# Patient Record
Sex: Female | Born: 1995 | Race: Black or African American | Hispanic: No | Marital: Single | Smoking: Former smoker
Health system: Southern US, Community
[De-identification: ages and names within clinical notes are randomized; demographics above are authoritative.]

## PROBLEM LIST (undated history)

## (undated) ENCOUNTER — Inpatient Hospital Stay: Payer: Self-pay

## (undated) DIAGNOSIS — F329 Major depressive disorder, single episode, unspecified: Secondary | ICD-10-CM

## (undated) DIAGNOSIS — M329 Systemic lupus erythematosus, unspecified: Secondary | ICD-10-CM

## (undated) DIAGNOSIS — M1990 Unspecified osteoarthritis, unspecified site: Secondary | ICD-10-CM

## (undated) DIAGNOSIS — IMO0002 Reserved for concepts with insufficient information to code with codable children: Secondary | ICD-10-CM

## (undated) DIAGNOSIS — F53 Postpartum depression: Secondary | ICD-10-CM

## (undated) DIAGNOSIS — I10 Essential (primary) hypertension: Secondary | ICD-10-CM

## (undated) DIAGNOSIS — J45909 Unspecified asthma, uncomplicated: Secondary | ICD-10-CM

## (undated) DIAGNOSIS — F32A Depression, unspecified: Secondary | ICD-10-CM

## (undated) DIAGNOSIS — O99345 Other mental disorders complicating the puerperium: Secondary | ICD-10-CM

## (undated) DIAGNOSIS — O99019 Anemia complicating pregnancy, unspecified trimester: Secondary | ICD-10-CM

## (undated) HISTORY — PX: DILATION AND EVACUATION: SHX1459

---
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
## 2015-03-10 ENCOUNTER — Emergency Department: Payer: Self-pay | Admitting: Physician Assistant

## 2015-05-01 NOTE — H&P (Signed)
L&D Evaluation:  History:  HPI 19 yo G1at 941w3d by EDC of 07/24/2014 by LMP=10/17/2013 and 10 week ultrasound presenting for contractions.   She noted contractions after her exam today when membranes were striped. Contractions became more frequent around 2300 tonight. No LOF or VB. +FM  No prenatal labs available for review other than GBS negative, GC & CT negative on 07/12/14.   [facility], noteable for low PAPP-A levels (2%ile) but otherwise normal 1st trimester screen.  EFW on 05/09/14 c./w 45% and EFW on 7/29 was 6#15 oz.with a normal AFI.   Presents with contractions   Patient's Medical History Asthma  Anemia   Patient's Surgical History Wisdom teeth extraction   Medications Pre Natal Vitamins  Iron  QVAR-2 puffs BID, Albuterol prn   Allergies NKDA, Shellfish   Social History none   Family History Non-Contributory   ROS:  ROS see HPI   Exam:  General breathing with contractions   Mental Status clear   Chest clear   Heart normal sinus rhythm, no murmur/gallop/rubs   Abdomen gravid, tender with contractions   Estimated Fetal Weight 6#15 oz on 7/29/ultrasound   Fetal Position cephalic   Edema trace   Reflexes 2+   Pelvic 4.5/C/0 (was 3/90%/-1 on arrival)   Mebranes Intact   FHT normal rate with no decels, 125 baseline with mod variability.   Fetal Heart Rate 125   Ucx q2-3   Impression:  Impression IUP at 40 1/7 weeks in labor   Plan:  Plan Admit. Expectant management. Stadol for pain. Epidural when appropriate.  Prenatal labs ordered.   Electronic Signatures: Trinna BalloonGutierrez, Robertt Buda L (CNM)  (Signed 04-Aug-15 02:59)  Authored: L&D Evaluation   Last Updated: 04-Aug-15 02:59 by Trinna BalloonGutierrez, Verbena Boeding L (CNM)

## 2015-05-01 NOTE — H&P (Signed)
L&D Evaluation:  History:  HPI 19 [facility]d [facility] for [facility].  Last cervical check was yesterday and was 1.5/50/-2.  She noted contractions starting this morning increasing in intensity. She also reports a headache and back pain.  +FM, no LOF, no VB.  No prenatal labs available for review other than GBS negative, GC & CT negative on 07/12/14.   [facility], noteable for low PAPP-A levels (2%ile) but otherwise normal 1st trimester screen.  EFW on 05/09/14 c./w 45%ile although I do not have actual measurement available for review and the patient had a subsequent lapse in care.   Presents with back pain, contractions, other, headache   Patient's Medical History Asthma   Patient's Surgical History none   Medications Pre Natal Vitamins   Allergies NKDA   Social History none   Family History Non-Contributory   ROS:  ROS All systems were reviewed.  HEENT, CNS, GI, GU, Respiratory, CV, Renal and Musculoskeletal systems were found to be normal.   Exam:  Vital Signs stable  138/82   Urine Protein trace, trace to negative   General no apparent distress   Mental Status clear   Chest clear   Heart normal sinus rhythm   Abdomen gravid, non-tender   Estimated Fetal Weight Average for gestational age   Fetal Position vtx   Edema no edema   Pelvic no external lesions, 1.5/50/-2   Mebranes Intact   FHT normal rate with no decels, 120, moderate, positive accels, no decels   Ucx irregular, 4-738min   Skin dry, no lesions, no rashes   Lymph no lymphadenopathy   Impression:  Impression Early labor vs braxton hicks contractions   Plan:  Plan EFM/NST, monitor contractions and for cervical change, will continue to monitor for cervical change. Cycle BP, send urine protein/cratinine ratio.   Comments Will get prenatal records to review.   Follow Up Appointment need to schedule   Electronic Signatures: Jannet MantisSubudhi, Paxton Binns (CNM)   (Signed 30-Jul-15 13:20)  Authored: L&D Evaluation   Last Updated: 30-Jul-15 13:20 by Jannet MantisSubudhi, Cacie Gaskins (CNM)

## 2015-05-01 NOTE — H&P (Signed)
L&D Evaluation:  History:  HPI 19 yo G1at 4420w1d by [facility] presenting for contractions.  Last cervical check in clinic .  She noted contractions starting this morning increasing in intensity.  +FM, no LOF, no VB.  No prenatal labs available for review other than GBS negative, GC & CT negative on 07/12/14.    [facility], noteable for low PAPP-A levels (2%ile) but otherwise normal 1st trimester screen.  EFW on 05/09/14 c./w 45%ile although I do not have actual measurement available for review and the patient had a subsequent laps in care.   Presents with contractions   Patient's Medical History Asthma   Patient's Surgical History none   Medications Pre Natal Vitamins   Allergies NKDA   Social History none   Family History Non-Contributory   ROS:  ROS All systems were reviewed.  HEENT, CNS, GI, GU, Respiratory, CV, Renal and Musculoskeletal systems were found to be normal.   Exam:  Vital Signs stable   Urine Protein not completed   General no apparent distress   Mental Status clear   Chest clear   Heart normal sinus rhythm   Abdomen gravid, non-tender   Estimated Fetal Weight Average for gestational age   Fetal Position vtx   Edema no edema   Pelvic no external lesions, 1.5/50/-3 unchanged over 2-hrs   Mebranes Intact   FHT normal rate with no decels, 120, moderate, positive accels, no decels   Ucx irregular, 5-809min   Impression:  Impression Early labor vs braxton hicks contractions   Plan:  Plan EFM/NST, monitor contractions and for cervical change   Comments 1) Labor - likely braxton hick contractions as no cervical change.  Given option of discharge home vs morphine sleep.  Patient opted for morphine sleep  2) Fetus - reactive category I tracing  3) PNL - unavailable for review  4) If fails to make cervical change and ressuring montioring home later this evening vs tomorrow morning with follow up tomorrow in clinic 07/19/14 18:30  [place]   Electronic Signatures: Lorrene ReidStaebler, Dario Yono M (MD)  (Signed 28-Jul-15 18:28)  Authored: L&D Evaluation   Last Updated: 28-Jul-15 18:28 by Lorrene ReidStaebler, Mikko Lewellen M (MD)

## 2015-07-26 ENCOUNTER — Emergency Department
Admission: EM | Admit: 2015-07-26 | Discharge: 2015-07-26 | Disposition: A | Discharge status: Home / Self Care | Payer: Medicaid Other | Attending: Emergency Medicine | Admitting: Emergency Medicine

## 2015-07-26 ENCOUNTER — Other Ambulatory Visit: Payer: Self-pay

## 2015-07-26 ENCOUNTER — Encounter: Payer: Self-pay | Admitting: Emergency Medicine

## 2015-07-26 DIAGNOSIS — E860 Dehydration: Secondary | ICD-10-CM | POA: Diagnosis not present

## 2015-07-26 DIAGNOSIS — Z720 Tobacco use: Secondary | ICD-10-CM | POA: Insufficient documentation

## 2015-07-26 DIAGNOSIS — R55 Syncope and collapse: Secondary | ICD-10-CM | POA: Diagnosis present

## 2015-07-26 DIAGNOSIS — Z3202 Encounter for pregnancy test, result negative: Secondary | ICD-10-CM | POA: Diagnosis not present

## 2015-07-26 HISTORY — DX: Anemia complicating pregnancy, unspecified trimester: O99.019

## 2015-07-26 LAB — URINALYSIS COMPLETE WITH MICROSCOPIC (ARMC ONLY)
BILIRUBIN URINE: NEGATIVE
Bacteria, UA: NONE SEEN
Glucose, UA: NEGATIVE mg/dL
Hgb urine dipstick: NEGATIVE
Nitrite: NEGATIVE
Protein, ur: NEGATIVE mg/dL
Specific Gravity, Urine: 1.017 (ref 1.005–1.030)
pH: 5 (ref 5.0–8.0)

## 2015-07-26 LAB — BASIC METABOLIC PANEL
ANION GAP: 8 (ref 5–15)
BUN: 12 mg/dL (ref 6–20)
CO2: 28 mmol/L (ref 22–32)
Calcium: 9.9 mg/dL (ref 8.9–10.3)
Chloride: 103 mmol/L (ref 101–111)
Creatinine, Ser: 0.7 mg/dL (ref 0.44–1.00)
GLUCOSE: 80 mg/dL (ref 65–99)
Potassium: 3.7 mmol/L (ref 3.5–5.1)
Sodium: 139 mmol/L (ref 135–145)

## 2015-07-26 LAB — CBC
HCT: 43.5 % (ref 35.0–47.0)
Hemoglobin: 14.6 g/dL (ref 12.0–16.0)
MCH: 29.3 pg (ref 26.0–34.0)
MCHC: 33.6 g/dL (ref 32.0–36.0)
MCV: 87.3 fL (ref 80.0–100.0)
Platelets: 232 10*3/uL (ref 150–440)
RBC: 4.98 MIL/uL (ref 3.80–5.20)
RDW: 13.3 % (ref 11.5–14.5)
WBC: 5.6 10*3/uL (ref 3.6–11.0)

## 2015-07-26 LAB — POCT PREGNANCY, URINE: Preg Test, Ur: NEGATIVE

## 2015-07-26 MED ORDER — ONDANSETRON 8 MG PO TBDP: 8.0000 mg | ORAL_TABLET | Freq: Three times a day (TID) | ORAL | Status: DC | PRN

## 2015-07-26 MED ORDER — ONDANSETRON 8 MG PO TBDP
ORAL_TABLET | ORAL | Status: AC
  Administered 2015-07-26: 8 mg
  Filled 2015-07-26: qty 1

## 2015-07-26 MED ORDER — ONDANSETRON HCL 8 MG PO TABS
8.0000 mg | ORAL_TABLET | Freq: Once | ORAL | Status: AC
  Administered 2015-07-26: 8 mg via ORAL

## 2015-07-26 NOTE — Discharge Instructions (Signed)
Dehydration, Adult °Dehydration is when you lose more fluids from the body than you take in. Vital organs like the kidneys, brain, and heart cannot function without a proper amount of fluids and salt. Any loss of fluids from the body can cause dehydration.  °CAUSES  °· Vomiting. °· Diarrhea. °· Excessive sweating. °· Excessive urine output. °· Fever. °SYMPTOMS  °Mild dehydration °· Thirst. °· Dry lips. °· Slightly dry mouth. °Moderate dehydration °· Very dry mouth. °· Sunken eyes. °· Skin does not bounce back quickly when lightly pinched and released. °· Dark urine and decreased urine production. °· Decreased tear production. °· Headache. °Severe dehydration °· Very dry mouth. °· Extreme thirst. °· Rapid, weak pulse (more than 100 beats per minute at rest). °· Cold hands and feet. °· Not able to sweat in spite of heat and temperature. °· Rapid breathing. °· Blue lips. °· Confusion and lethargy. °· Difficulty being awakened. °· Minimal urine production. °· No tears. °DIAGNOSIS  °Your caregiver will diagnose dehydration based on your symptoms and your exam. Blood and urine tests will help confirm the diagnosis. The diagnostic evaluation should also identify the cause of dehydration. °TREATMENT  °Treatment of mild or moderate dehydration can often be done at home by increasing the amount of fluids that you drink. It is best to drink small amounts of fluid more often. Drinking too much at one time can make vomiting worse. Refer to the home care instructions below. °Severe dehydration needs to be treated at the hospital where you will probably be given intravenous (IV) fluids that contain water and electrolytes. °HOME CARE INSTRUCTIONS  °· Ask your caregiver about specific rehydration instructions. °· Drink enough fluids to keep your urine clear or pale yellow. °· Drink small amounts frequently if you have nausea and vomiting. °· Eat as you normally do. °· Avoid: °· Foods or drinks high in sugar. °· Carbonated  drinks. °· Juice. °· Extremely hot or cold fluids. °· Drinks with caffeine. °· Fatty, greasy foods. °· Alcohol. °· Tobacco. °· Overeating. °· Gelatin desserts. °· Wash your hands well to avoid spreading bacteria and viruses. °· Only take over-the-counter or prescription medicines for pain, discomfort, or fever as directed by your caregiver. °· Ask your caregiver if you should continue all prescribed and over-the-counter medicines. °· Keep all follow-up appointments with your caregiver. °SEEK MEDICAL CARE IF: °· You have abdominal pain and it increases or stays in one area (localizes). °· You have a rash, stiff neck, or severe headache. °· You are irritable, sleepy, or difficult to awaken. °· You are weak, dizzy, or extremely thirsty. °SEEK IMMEDIATE MEDICAL CARE IF:  °· You are unable to keep fluids down or you get worse despite treatment. °· You have frequent episodes of vomiting or diarrhea. °· You have blood or green matter (bile) in your vomit. °· You have blood in your stool or your stool looks black and tarry. °· You have not urinated in 6 to 8 hours, or you have only urinated a small amount of very dark urine. °· You have a fever. °· You faint. °MAKE SURE YOU:  °· Understand these instructions. °· Will watch your condition. °· Will get help right away if you are not doing well or get worse. °Document Released: 12/08/2005 Document Revised: 03/01/2012 Document Reviewed: 07/28/2011 °ExitCare® Patient Information ©2015 [employer]. This information is not intended to replace advice given to you by your health care provider. Make sure you discuss any questions you have with your health care   provider. ° °Syncope °Syncope is a medical term for fainting or passing out. This means you lose consciousness and drop to the ground. People are generally unconscious for less than 5 minutes. You may have some muscle twitches for up to 15 seconds before waking up and returning to normal. Syncope occurs more often in older  adults, but it can happen to anyone. While most causes of syncope are not dangerous, syncope can be a sign of a serious medical problem. It is important to seek medical care.  °CAUSES  °Syncope is caused by a sudden drop in blood flow to the brain. The specific cause is often not determined. Factors that can bring on syncope include: °· Taking medicines that lower blood pressure. °· Sudden changes in posture, such as standing up quickly. °· Taking more medicine than prescribed. °· Standing in one place for too long. °· Seizure disorders. °· Dehydration and excessive exposure to heat. °· Low blood sugar (hypoglycemia). °· Straining to have a bowel movement. °· Heart disease, irregular heartbeat, or other circulatory problems. °· Fear, emotional distress, seeing blood, or severe pain. °SYMPTOMS  °Right before fainting, you may: °· Feel dizzy or light-headed. °· Feel nauseous. °· See all white or all black in your field of vision. °· Have cold, clammy skin. °DIAGNOSIS  °Your health care provider will ask about your symptoms, perform a physical exam, and perform an electrocardiogram (ECG) to record the electrical activity of your heart. Your health care provider may also perform other heart or blood tests to determine the cause of your syncope which may include: °· Transthoracic echocardiogram (TTE). During echocardiography, sound waves are used to evaluate how blood flows through your heart. °· Transesophageal echocardiogram (TEE). °· Cardiac monitoring. This allows your health care provider to monitor your heart rate and rhythm in real time. °· Holter monitor. This is a portable device that records your heartbeat and can help diagnose heart arrhythmias. It allows your health care provider to track your heart activity for several days, if needed. °· Stress tests by exercise or by giving medicine that makes the heart beat faster. °TREATMENT  °In most cases, no treatment is needed. Depending on the cause of your syncope,  your health care provider may recommend changing or stopping some of your medicines. °HOME CARE INSTRUCTIONS °· Have someone stay with you until you feel stable. °· Do not drive, use machinery, or play sports until your health care provider says it is okay. °· Keep all follow-up appointments as directed by your health care provider. °· Lie down right away if you start feeling like you might faint. Breathe deeply and steadily. Wait until all the symptoms have passed. °· Drink enough fluids to keep your urine clear or pale yellow. °· If you are taking blood pressure or heart medicine, get up slowly and take several minutes to sit and then stand. This can reduce dizziness. °SEEK IMMEDIATE MEDICAL CARE IF:  °· You have a severe headache. °· You have unusual pain in the chest, abdomen, or back. °· You are bleeding from your mouth or rectum, or you have black or tarry stool. °· You have an irregular or very fast heartbeat. °· You have pain with breathing. °· You have repeated fainting or seizure-like jerking during an episode. °· You faint when sitting or lying down. °· You have confusion. °· You have trouble walking. °· You have severe weakness. °· You have vision problems. °If you fainted, call your local emergency services (911 in [place]   Do not drive yourself to the hospital.  °MAKE SURE YOU: °· Understand these instructions. °· Will watch your condition. °· Will get help right away if you are not doing well or get worse. °Document Released: 12/08/2005 Document Revised: 12/13/2013 Document Reviewed: 02/06/2012 °ExitCare® Patient Information ©2015 [employer]. This information is not intended to replace advice given to you by your health care provider. Make sure you discuss any questions you have with your health care provider. ° °

## 2015-07-26 NOTE — ED Notes (Signed)
Patient to ER for c/o syncopal episode. Patient was at a funeral and had been standing out in sun when incident occurred.

## 2015-07-26 NOTE — ED Provider Notes (Signed)
[facility] Emergency Department Provider Note  ____________________________________________  Time seen: 7:30 PM  I have reviewed the triage vital signs and the nursing notes.   HISTORY  Chief Complaint Loss of Consciousness    HPI Nancy Ball is a 19 y.o. female who complains of syncope today. She works night shifts, and when she got home this morning she went straight to bed without eating or drinking anything. She then woke up and had to leave for a funeral so she got dressed and left for the funeral immediately without eating or drinking anything. Service she was then standing outside on a hot day, standing still for greater than 1 hour. She then felt very dizzy and essentially passed out. Her husband was standing beside her caught her and she did not have any fall or significant injury. She woke back up quickly and is only been complaining of dizziness and generalized headaches then. She denies any other fever chills nausea vomiting diarrhea urinary symptoms vaginal bleeding or discharge chest pain shortness breath or abdominal pain.     Past Medical History  Diagnosis Date  . Anemia during pregnancy     There are no active problems to display for this patient.   History reviewed. No pertinent past surgical history.  Current Outpatient Rx  Name  Route  Sig  Dispense  Refill  . ondansetron (ZOFRAN ODT) 8 MG disintegrating tablet   Oral   Take 1 tablet (8 mg total) by mouth every 8 (eight) hours as needed for nausea or vomiting.   20 tablet   0     Allergies Shellfish allergy  No family history on file.  Social History History  Substance Use Topics  . Smoking status: Current Every Day Smoker  . Smokeless tobacco: Not on file  . Alcohol Use: No    Review of Systems  Constitutional: No fever or chills. No weight changes Eyes:No blurry vision or double vision.  ENT: No sore throat. Cardiovascular: No chest pain. Respiratory: No  dyspnea or cough. Gastrointestinal: Negative for abdominal pain, vomiting and diarrhea.  No BRBPR or melena. Genitourinary: Negative for dysuria, urinary retention, bloody urine, or difficulty urinating. Musculoskeletal: Negative for back pain. No joint swelling or pain. Skin: Negative for rash. Neurological: Negative for headaches, focal weakness or numbness. Syncope as above Psychiatric:No anxiety or depression.   Endocrine:No hot/cold intolerance, changes in energy, or sleep difficulty.  10-point ROS otherwise negative.  ____________________________________________   PHYSICAL EXAM:  VITAL SIGNS: ED Triage Vitals  Enc Vitals Group     BP 07/26/15 1829 112/66 mmHg     Pulse Rate 07/26/15 1829 63     Resp 07/26/15 1829 18     Temp 07/26/15 1829 98.3 F (36.8 C)     Temp Source 07/26/15 1829 Oral     SpO2 07/26/15 1828 99 %     Weight 07/26/15 1829 120 lb (54.432 kg)     Height 07/26/15 1829  (1.499 m)     Head Cir --      Peak Flow --      Pain Score --      Pain Loc --      Pain Edu? --      Excl. in GC? --      Constitutional: Alert and oriented. Well appearing and in no distress. Eyes: No scleral icterus. No conjunctival pallor. PERRL. EOMI ENT   Head: Normocephalic and atraumatic.   Nose: No congestion/rhinnorhea. No septal hematoma  Mouth/Throat: MMM, no pharyngeal erythema. No peritonsillar mass. No uvula shift.   Neck: No stridor. No SubQ emphysema. No meningismus. Hematological/Lymphatic/Immunilogical: No cervical lymphadenopathy. Cardiovascular: RRR. Normal and symmetric distal pulses are present in all extremities. No murmurs, rubs, or gallops. Respiratory: Normal respiratory effort without tachypnea nor retractions. Breath sounds are clear and equal bilaterally. No wheezes/rales/rhonchi. Gastrointestinal: Soft and nontender. No distention. There is no CVA tenderness.  No rebound, rigidity, or guarding. Genitourinary:  deferred Musculoskeletal: Nontender with normal range of motion in all extremities. No joint effusions.  No lower extremity tenderness.  No edema. Neurologic:   Normal speech and language.  CN 2-10 normal. Motor grossly intact. No pronator drift.  Normal gait. No gross focal neurologic deficits are appreciated.  Skin:  Skin is warm, dry and intact. No rash noted.  No petechiae, purpura, or bullae. Psychiatric: Mood and affect are normal. Speech and behavior are normal. Patient exhibits appropriate insight and judgment.  ____________________________________________    LABS (pertinent positives/negatives) (all labs ordered are listed, but only abnormal results are displayed) Labs Reviewed  URINALYSIS COMPLETEWITH MICROSCOPIC ([facility] ONLY) - Abnormal; Notable for the following:    Color, Urine YELLOW (*)    APPearance CLOUDY (*)    Ketones, ur TRACE (*)    Leukocytes, UA 3+ (*)    Squamous Epithelial / LPF 6-30 (*)    All other components within normal limits  BASIC METABOLIC PANEL  CBC  POC URINE PREG, ED  POCT PREGNANCY, URINE   ____________________________________________   EKG  Interpreted by me  Date: 07/26/2015  Rate: 62  Rhythm: normal sinus rhythm  QRS Axis: normal  Intervals: normal  ST/T Wave abnormalities: normal  Conduction Disutrbances: none  Narrative Interpretation: unremarkable      ____________________________________________    RADIOLOGY    ____________________________________________   PROCEDURES  ____________________________________________   INITIAL IMPRESSION / ASSESSMENT AND PLAN / ED COURSE  Pertinent labs & imaging results that were available during my care of the patient were reviewed by me and considered in my medical decision making (see chart for details).  Patient presents with syncope in the setting of dehydration due to inadequate oral intake today. Stress or emotional response due to the funeral and vasovagal activity  may have played a role as well. No evidence of underlying cardiac vascular or neurologic pathology. She is well-appearing no acute distress, tolerating oral intake. We'll give her Zofran and discharged home.  ____________________________________________   FINAL CLINICAL IMPRESSION(S) / ED DIAGNOSES  Final diagnoses:  Syncope, unspecified syncope type  Dehydration, moderate      Sharman Cheek, MD 07/26/15 2001

## 2015-07-26 NOTE — ED Notes (Signed)
Patient with no complaints at this time. Respirations even and unlabored. Skin warm/dry. Discharge instructions reviewed with patient at this time. Patient given opportunity to voice concerns/ask questions. Patient discharged at this time and left Emergency Department with steady gait.   

## 2015-08-31 ENCOUNTER — Emergency Department
Admission: EM | Admit: 2015-08-31 | Discharge: 2015-08-31 | Disposition: A | Discharge status: Home / Self Care | Payer: Medicaid Other | Attending: Emergency Medicine | Admitting: Emergency Medicine

## 2015-08-31 ENCOUNTER — Encounter: Payer: Self-pay | Admitting: Emergency Medicine

## 2015-08-31 DIAGNOSIS — Z720 Tobacco use: Secondary | ICD-10-CM | POA: Diagnosis not present

## 2015-08-31 DIAGNOSIS — R21 Rash and other nonspecific skin eruption: Secondary | ICD-10-CM | POA: Diagnosis present

## 2015-08-31 DIAGNOSIS — B86 Scabies: Secondary | ICD-10-CM | POA: Diagnosis not present

## 2015-08-31 HISTORY — DX: Unspecified osteoarthritis, unspecified site: M19.90

## 2015-08-31 MED ORDER — LINDANE 1 % EX LOTN: TOPICAL_LOTION | CUTANEOUS | Status: DC

## 2015-08-31 NOTE — ED Provider Notes (Signed)
[facility] Emergency Department Provider Note  ____________________________________________  Time seen: Approximately 7:41 AM  I have reviewed the triage vital signs and the nursing notes.   HISTORY  Chief Complaint Rash   HPI Nancy Ball is a 19 y.o. female is here with complaint of rash all over times one month. Patient states it itches worse at night. Initially it started with her hands as now traveled to her abdomen, back, legs and feet. Currently her boyfriend is in the room with her and he states he has same rash. Patient has not seen a doctor in a month's time but has been using over-the-counter cortisone 10 without any relief. Patient denies any fever or chills. She has not had any tick bites that she is aware of. Currently she rates her pain a 5 out of 10.   Past Medical History  Diagnosis Date  . Anemia during pregnancy   . Arthritis     There are no active problems to display for this patient.   History reviewed. No pertinent past surgical history.  Current Outpatient Rx  Name  Route  Sig  Dispense  Refill  . lindane lotion (KWELL) 1 %      Apply and leave over night and shower next morning   60 mL   0   . ondansetron (ZOFRAN ODT) 8 MG disintegrating tablet   Oral   Take 1 tablet (8 mg total) by mouth every 8 (eight) hours as needed for nausea or vomiting.   20 tablet   0     Allergies Shellfish allergy  History reviewed. No pertinent family history.  Social History Social History  Substance Use Topics  . Smoking status: Current Every Day Smoker  . Smokeless tobacco: None  . Alcohol Use: No    Review of Systems Constitutional: No fever/chills ENT: No sore throat. Cardiovascular: Denies chest pain. Respiratory: Denies shortness of breath. Gastrointestinal: No abdominal pain.  No nausea, no vomiting.  Musculoskeletal: Negative for back pain. Skin: Positive for rash. Neurological: Negative for headaches, focal  weakness or numbness.  10-point ROS otherwise negative.  ____________________________________________   PHYSICAL EXAM:  VITAL SIGNS: ED Triage Vitals  Enc Vitals Group     BP 08/31/15 0732 109/60 mmHg     Pulse Rate 08/31/15 0732 74     Resp 08/31/15 0732 18     Temp 08/31/15 0732 98.6 F (37 C)     Temp Source 08/31/15 0732 Oral     SpO2 08/31/15 0732 97 %     Weight 08/31/15 0732 110 lb (49.896 kg)     Height 08/31/15 0732  (1.499 m)     Head Cir --      Peak Flow --      Pain Score 08/31/15 0733 5     Pain Loc --      Pain Edu? --      Excl. in GC? --     Constitutional: Alert and oriented. Well appearing and in no acute distress. Eyes: Conjunctivae are normal. PERRL. EOMI. Head: Atraumatic. Nose: No congestion/rhinnorhea. Neck: No stridor.   Cardiovascular: Normal rate, regular rhythm. Grossly normal heart sounds.  Good peripheral circulation. Respiratory: Normal respiratory effort.  No retractions. Lungs CTAB. Gastrointestinal: Soft and nontender. No distention. No abdominal bruits. No CVA tenderness. Musculoskeletal: Moves upper extremities without any difficulty. No lower extremity tenderness nor edema.  No joint effusions. Neurologic:  Normal speech and language. No gross focal neurologic deficits are appreciated. No  gait instability. Skin:  Skin is warm, dry and intact. There is a papular rash with moderate involvement on the hands and some involvement in the web spaces. There is diffuse involvement on the abdomen and minimal involvement on the back. There is more involvement on the feet and legs. There is no evidence of infection. There is no drainage from these areas. Psychiatric: Mood and affect are normal. Speech and behavior are normal.  ____________________________________________   LABS (all labs ordered are listed, but only abnormal results are displayed)  Labs Reviewed - No data to  display ____________________________________________  PROCEDURES  Procedure(s) performed: None  Critical Care performed: No  ____________________________________________   INITIAL IMPRESSION / ASSESSMENT AND PLAN / ED COURSE  Pertinent labs & imaging results that were available during my care of the patient were reviewed by me and considered in my medical decision making (see chart for details).  Patient was told that with the history and the appearance of the rash that is extremely suspicious for scabies especially since her partner also has similar rash. Patient was given a prescription for lidocaine lotion 1%. She was also given the name of a dermatologist to follow-up with if any continued problems. ____________________________________________   FINAL CLINICAL IMPRESSION(S) / ED DIAGNOSES  Final diagnoses:  Scabies      Tommi Rumps, PA-C 08/31/15 [id number]  Sharyn Creamer, MD 09/01/15 [phone]

## 2015-08-31 NOTE — Discharge Instructions (Signed)

## 2015-08-31 NOTE — ED Notes (Signed)
Reports rash all over x 1 month, now starting to hurt.  No resp distress

## 2015-11-03 ENCOUNTER — Emergency Department
Admission: EM | Admit: 2015-11-03 | Discharge: 2015-11-03 | Disposition: A | Discharge status: Home / Self Care | Payer: Medicaid Other | Attending: Emergency Medicine | Admitting: Emergency Medicine

## 2015-11-03 DIAGNOSIS — Y9302 Activity, running: Secondary | ICD-10-CM | POA: Diagnosis not present

## 2015-11-03 DIAGNOSIS — S29012A Strain of muscle and tendon of back wall of thorax, initial encounter: Secondary | ICD-10-CM | POA: Insufficient documentation

## 2015-11-03 DIAGNOSIS — M549 Dorsalgia, unspecified: Secondary | ICD-10-CM

## 2015-11-03 DIAGNOSIS — X58XXXA Exposure to other specified factors, initial encounter: Secondary | ICD-10-CM | POA: Insufficient documentation

## 2015-11-03 DIAGNOSIS — S299XXA Unspecified injury of thorax, initial encounter: Secondary | ICD-10-CM | POA: Diagnosis present

## 2015-11-03 DIAGNOSIS — S29019A Strain of muscle and tendon of unspecified wall of thorax, initial encounter: Secondary | ICD-10-CM

## 2015-11-03 DIAGNOSIS — Y92009 Unspecified place in unspecified non-institutional (private) residence as the place of occurrence of the external cause: Secondary | ICD-10-CM | POA: Diagnosis not present

## 2015-11-03 DIAGNOSIS — Z3202 Encounter for pregnancy test, result negative: Secondary | ICD-10-CM | POA: Diagnosis not present

## 2015-11-03 DIAGNOSIS — Z720 Tobacco use: Secondary | ICD-10-CM | POA: Diagnosis not present

## 2015-11-03 DIAGNOSIS — Y998 Other external cause status: Secondary | ICD-10-CM | POA: Insufficient documentation

## 2015-11-03 LAB — URINALYSIS COMPLETE WITH MICROSCOPIC (ARMC ONLY)
Bilirubin Urine: NEGATIVE
GLUCOSE, UA: NEGATIVE mg/dL
Hgb urine dipstick: NEGATIVE
Nitrite: NEGATIVE
Protein, ur: NEGATIVE mg/dL
Specific Gravity, Urine: 1.018 (ref 1.005–1.030)
pH: 6 (ref 5.0–8.0)

## 2015-11-03 MED ORDER — IBUPROFEN 600 MG PO TABS
600.0000 mg | ORAL_TABLET | Freq: Once | ORAL | Status: AC
  Administered 2015-11-03: 600 mg via ORAL
  Filled 2015-11-03: qty 1

## 2015-11-03 MED ORDER — IBUPROFEN 600 MG PO TABS: 600.0000 mg | ORAL_TABLET | Freq: Four times a day (QID) | ORAL | Status: DC | PRN

## 2015-11-03 NOTE — ED Notes (Signed)
Pregnancy test: Negative

## 2015-11-03 NOTE — Discharge Instructions (Signed)
Apply ice or heat to back as needed for back discomfort. Begin taking ibuprofen every 8 hours as needed for back discomfort.  You received a prescription for the same [facility] if any continued problems.

## 2015-11-03 NOTE — ED Notes (Signed)
Pt c/o "all over" back pain since playing with her 19yo child yesterday.

## 2015-11-03 NOTE — ED Provider Notes (Signed)
[facility] Emergency Department Provider Note  ____________________________________________  Time seen: Approximately 10:33 AM  I have reviewed the triage vital signs and the nursing notes.   HISTORY  Chief Complaint Back Pain  HPI Nancy Ball is a 19 y.o. female is here with complaint of back pain "all over" since playing with her 19-year-old daughter yesterday. Patient states that she was "running around the house". She denies any fall or direct trauma to her back. She began having some discomfort last evening which is worse this morning. She has not taken any over-the-counter medication today but did take one Tylenol last evening. She denies any previous problems with her back. She denies any paresthesias or UTI symptoms. There is been no nausea, vomiting, fever or chills.   Past Medical History  Diagnosis Date  . Anemia during pregnancy   . Arthritis     There are no active problems to display for this patient.   History reviewed. No pertinent past surgical history.  Current Outpatient Rx  Name  Route  Sig  Dispense  Refill  . ibuprofen (ADVIL,MOTRIN) 600 MG tablet   Oral   Take 1 tablet (600 mg total) by mouth every 6 (six) hours as needed.   30 tablet   0   . lindane lotion (KWELL) 1 %      Apply and leave over night and shower next morning   60 mL   0   . ondansetron (ZOFRAN ODT) 8 MG disintegrating tablet   Oral   Take 1 tablet (8 mg total) by mouth every 8 (eight) hours as needed for nausea or vomiting.   20 tablet   0     Allergies Shellfish allergy  No family history on file.  Social History Social History  Substance Use Topics  . Smoking status: Current Every Day Smoker  . Smokeless tobacco: None  . Alcohol Use: No    Review of Systems Constitutional: No fever/chills Cardiovascular: Denies chest pain. Respiratory: Denies shortness of breath. Gastrointestinal: No abdominal pain.  No nausea, no vomiting.   Genitourinary: Negative for dysuria. Musculoskeletal: Positive for back pain. Skin: Negative for rash. Neurological: Negative for headaches, focal weakness or numbness.  10-point ROS otherwise negative.  ____________________________________________   PHYSICAL EXAM:  VITAL SIGNS: ED Triage Vitals  Enc Vitals Group     BP 11/03/15 0955 113/71 mmHg     Pulse Rate 11/03/15 0955 66     Resp 11/03/15 0955 16     Temp 11/03/15 0955 98.4 F (36.9 C)     Temp Source 11/03/15 0955 Oral     SpO2 11/03/15 0955 100 %     Weight 11/03/15 0955 120 lb (54.432 kg)     Height 11/03/15 0955 4\' 11"  (1.499 m)     Head Cir --      Peak Flow --      Pain Score 11/03/15 0956 8     Pain Loc --      Pain Edu? --      Excl. in GC? --     Constitutional: Alert and oriented. Well appearing and in no acute distress. Eyes: Conjunctivae are normal. PERRL. EOMI. Head: Atraumatic. Nose: No congestion/rhinnorhea. Neck: No stridor.   Cardiovascular: Normal rate, regular rhythm. Grossly normal heart sounds.  Good peripheral circulation. Respiratory: Normal respiratory effort.  No retractions. Lungs CTAB. Gastrointestinal: Soft and nontender. No distention. No CVA tenderness. Musculoskeletal: Back exam no gross deformity. There is tenderness on palpation with questionable  right upper flank pain but no CVA tenderness. Range of motion is minimally restricted secondary to discomfort. There is no active muscle spasm seen. No lower extremity tenderness nor edema.  No joint effusions. Neurologic:  Normal speech and language. No gross focal neurologic deficits are appreciated. No gait instability. Skin:  Skin is warm, dry and intact. No rash noted. Psychiatric: Mood and affect are normal. Speech and behavior are normal.  ____________________________________________   LABS (all labs ordered are listed, but only abnormal results are displayed)  Labs Reviewed  URINALYSIS COMPLETEWITH MICROSCOPIC ([facility] ONLY) -  Abnormal; Notable for the following:    Color, Urine YELLOW (*)    APPearance HAZY (*)    Ketones, ur TRACE (*)    Leukocytes, UA 1+ (*)    Bacteria, UA RARE (*)    Squamous Epithelial / LPF 6-30 (*)    All other components within normal limits  POC URINE PREG, ED    PROCEDURES  Procedure(s) performed: None  Critical Care performed: No  ____________________________________________   INITIAL IMPRESSION / ASSESSMENT AND PLAN / ED COURSE  Pertinent labs & imaging results that were available during my care of the patient were reviewed by me and considered in my medical decision making (see chart for details).  Patient's pain status was reported to display provider is negative. Urinalysis was not conclusive for urinary tract infection and patient has no symptoms. Patient was given ibuprofen while in the emergency room along with prescription for the same. She is use ice or heat to her muscles and follow up with her doctor or [facility] if any continued problems. ____________________________________________   FINAL CLINICAL IMPRESSION(S) / ED DIAGNOSES  Final diagnoses:  Mid-back pain, acute      Tommi Rumps, PA-C 11/03/15 1613  Darien Ramus, MD 11/04/15 [phone]

## 2015-11-05 LAB — POCT PREGNANCY, URINE: Preg Test, Ur: NEGATIVE

## 2015-12-23 NOTE — L&D Delivery Note (Signed)
Obstetrical Delivery Note   Date of Delivery:   08/14/2016 Primary OB:   [facility] Gestational Age/EDD: 7558w5d  Antepartum complications: anemia and depression Intrapartum complications: None Delivered By:   TKB, CNM Delivery Type:   spontaneous vaginal delivery  Anesthesia:    epidural  GBS:    Negative Laceration:    none Episiotomy:    none Placenta:    Delivered via active management. To pathology: no.  Estimated Blood Loss:  200 mL Baby's Name:   Khamai Baby:    Liveborn female, APGARs 8/8, weight 3350/7#6.2gm   Delivery Details:   Quick second stage. Infant to maternal abdomen for delayed cord clamping, cut by family member after pulsation stopped. Infant to skin-to-skin with mother.

## 2016-03-03 LAB — OB RESULTS CONSOLE VARICELLA ZOSTER ANTIBODY, IGG: Varicella: IMMUNE

## 2016-03-03 LAB — OB RESULTS CONSOLE RUBELLA ANTIBODY, IGM: RUBELLA: IMMUNE

## 2016-03-03 LAB — OB RESULTS CONSOLE GC/CHLAMYDIA
CHLAMYDIA, DNA PROBE: NEGATIVE
CHLAMYDIA, DNA PROBE: NEGATIVE
GC PROBE AMP, GENITAL: NEGATIVE
Gonorrhea: NEGATIVE

## 2016-03-03 LAB — OB RESULTS CONSOLE HEPATITIS B SURFACE ANTIGEN: Hepatitis B Surface Ag: NEGATIVE

## 2016-03-03 LAB — OB RESULTS CONSOLE HIV ANTIBODY (ROUTINE TESTING)
HIV: NONREACTIVE
HIV: NONREACTIVE

## 2016-03-03 LAB — OB RESULTS CONSOLE ABO/RH: RH TYPE: POSITIVE

## 2016-03-03 LAB — OB RESULTS CONSOLE HGB/HCT, BLOOD
HCT: 34 %
Hemoglobin: 11.7 g/dL

## 2016-03-03 LAB — OB RESULTS CONSOLE RPR
RPR: NONREACTIVE
RPR: NONREACTIVE

## 2016-03-03 LAB — OB RESULTS CONSOLE ANTIBODY SCREEN: Antibody Screen: NEGATIVE

## 2016-03-03 LAB — OB RESULTS CONSOLE PLATELET COUNT: PLATELETS: 268 10*3/uL

## 2016-04-15 ENCOUNTER — Ambulatory Visit
Admission: RE | Admit: 2016-04-15 | Discharge: 2016-04-15 | Disposition: A | Discharge status: Home / Self Care | Payer: Medicaid Other | Source: Ambulatory Visit | Attending: Obstetrics and Gynecology | Admitting: Obstetrics and Gynecology

## 2016-04-15 DIAGNOSIS — R079 Chest pain, unspecified: Secondary | ICD-10-CM | POA: Insufficient documentation

## 2016-04-15 DIAGNOSIS — Z3A00 Weeks of gestation of pregnancy not specified: Secondary | ICD-10-CM | POA: Diagnosis not present

## 2016-04-15 DIAGNOSIS — O26892 Other specified pregnancy related conditions, second trimester: Secondary | ICD-10-CM | POA: Diagnosis not present

## 2016-05-11 ENCOUNTER — Encounter: Payer: Self-pay | Admitting: *Deleted

## 2016-05-11 ENCOUNTER — Observation Stay
Admission: EM | Admit: 2016-05-11 | Discharge: 2016-05-11 | Disposition: A | Discharge status: Home / Self Care | Payer: Medicaid Other | Attending: Obstetrics & Gynecology | Admitting: Obstetrics & Gynecology

## 2016-05-11 DIAGNOSIS — O42913 Preterm premature rupture of membranes, unspecified as to length of time between rupture and onset of labor, third trimester: Principal | ICD-10-CM | POA: Insufficient documentation

## 2016-05-11 DIAGNOSIS — Z3A28 28 weeks gestation of pregnancy: Secondary | ICD-10-CM | POA: Insufficient documentation

## 2016-05-11 DIAGNOSIS — O4290 Premature rupture of membranes, unspecified as to length of time between rupture and onset of labor, unspecified weeks of gestation: Secondary | ICD-10-CM | POA: Diagnosis present

## 2016-05-11 HISTORY — DX: Unspecified asthma, uncomplicated: J45.909

## 2016-05-11 MED ORDER — ACETAMINOPHEN 325 MG PO TABS: 650.0000 mg | ORAL_TABLET | ORAL | Status: DC | PRN

## 2016-05-11 MED ORDER — ONDANSETRON HCL 4 MG/2ML IJ SOLN: 4.0000 mg | Freq: Four times a day (QID) | INTRAMUSCULAR | Status: DC | PRN

## 2016-05-11 NOTE — OB Triage Note (Signed)
C/O back pain since 0900 this am. Low to mid back pain. Describes as constant, cramping back pain. Reports leaking of fluid since 0900 this am. Fluid clear with no odor, per pt. Elaina HoopsElks, Genessis Flanary S

## 2016-05-12 NOTE — Final Progress Note (Signed)
Physician Final Progress Note  Patient ID: Nancy Ball MRN: [id number] DOB/AGE: 20/12/1995 19 y.o.  Admit date: 05/11/2016 Admitting provider: Nadara Mustardobert P Harris, MD Discharge date: 05/12/2016  Admission Diagnoses: vaginal discharge, concern for leakage of amniotic fluid  Discharge Diagnoses:  Active Problems:   Amniotic fluid leaking  Significant Findings/ Diagnostic Studies: A NST procedure was performed with FHR monitoring and a normal baseline established, appropriate time of 20-40 minutes of evaluation, and accels >2 seen w 15x15 characteristics.  Results show a REACTIVE NST.   Discharge Condition: good Pt found to have negative testing for amniotic fluid and no s/sx PTL.  Disposition: 01-Home or Self Care  Diet: Regular diet  Discharge Activity: Activity as tolerated     Medication List    ASK your doctor about these medications        acetaminophen 325 MG tablet  Commonly known as:  TYLENOL  Take 325 mg by mouth every 4 (four) hours as needed.     ibuprofen 600 MG tablet  Commonly known as:  ADVIL,MOTRIN  Take 1 tablet (600 mg total) by mouth every 6 (six) hours as needed.     lindane lotion 1 %  Commonly known as:  KWELL  Apply and leave over night and shower next morning     multivitamin-prenatal 27-0.8 MG Tabs tablet  Take 1 tablet by mouth daily at 12 noon.     ondansetron 8 MG disintegrating tablet  Commonly known as:  ZOFRAN ODT  Take 1 tablet (8 mg total) by mouth every 8 (eight) hours as needed for nausea or vomiting.     sertraline 25 MG tablet  Commonly known as:  ZOLOFT  Take 25 mg by mouth at bedtime.           Follow-up Information    Go to Letitia Libraobert Paul Harris, MD.   Specialty:  Obstetrics and Gynecology   Why:  scheduled appointment   Contact information:   [address] [place] [id number] [phone]      Total time spent taking care of this patient: NURSE TRIAGE VISIT, discussed  Signed: Letitia LibraRobert Paul Harris 05/12/2016,  2:23 AM

## 2016-05-16 LAB — OB RESULTS CONSOLE PLATELET COUNT: PLATELETS: 183 10*3/uL

## 2016-05-16 LAB — OB RESULTS CONSOLE HGB/HCT, BLOOD
HEMATOCRIT: 29 %
Hemoglobin: 10 g/dL

## 2016-06-23 ENCOUNTER — Observation Stay
Admission: EM | Admit: 2016-06-23 | Discharge: 2016-06-23 | Disposition: A | Discharge status: Home / Self Care | Payer: Medicaid Other | Attending: Advanced Practice Midwife | Admitting: Advanced Practice Midwife

## 2016-06-23 DIAGNOSIS — F329 Major depressive disorder, single episode, unspecified: Secondary | ICD-10-CM | POA: Insufficient documentation

## 2016-06-23 DIAGNOSIS — O99513 Diseases of the respiratory system complicating pregnancy, third trimester: Secondary | ICD-10-CM | POA: Insufficient documentation

## 2016-06-23 DIAGNOSIS — O4690 Antepartum hemorrhage, unspecified, unspecified trimester: Secondary | ICD-10-CM | POA: Diagnosis present

## 2016-06-23 DIAGNOSIS — Z3A33 33 weeks gestation of pregnancy: Secondary | ICD-10-CM | POA: Diagnosis not present

## 2016-06-23 DIAGNOSIS — J45909 Unspecified asthma, uncomplicated: Secondary | ICD-10-CM | POA: Diagnosis not present

## 2016-06-23 DIAGNOSIS — O99343 Other mental disorders complicating pregnancy, third trimester: Secondary | ICD-10-CM | POA: Insufficient documentation

## 2016-06-23 DIAGNOSIS — B9689 Other specified bacterial agents as the cause of diseases classified elsewhere: Secondary | ICD-10-CM | POA: Diagnosis not present

## 2016-06-23 DIAGNOSIS — Z87891 Personal history of nicotine dependence: Secondary | ICD-10-CM | POA: Insufficient documentation

## 2016-06-23 DIAGNOSIS — O26893 Other specified pregnancy related conditions, third trimester: Secondary | ICD-10-CM | POA: Diagnosis not present

## 2016-06-23 DIAGNOSIS — O23593 Infection of other part of genital tract in pregnancy, third trimester: Secondary | ICD-10-CM | POA: Diagnosis not present

## 2016-06-23 DIAGNOSIS — M1990 Unspecified osteoarthritis, unspecified site: Secondary | ICD-10-CM | POA: Diagnosis not present

## 2016-06-23 DIAGNOSIS — Z91013 Allergy to seafood: Secondary | ICD-10-CM | POA: Insufficient documentation

## 2016-06-23 DIAGNOSIS — O1213 Gestational proteinuria, third trimester: Secondary | ICD-10-CM | POA: Diagnosis present

## 2016-06-23 NOTE — Discharge Instructions (Signed)
Please drink plenty of water and rest. Please return it symptoms worsen. Discharge instructions given.

## 2016-06-23 NOTE — Discharge Summary (Signed)
Obstetric History and Physical  Nancy Ball is a 20 y.o. G2P1001 with Estimated Date of Delivery: 08/09/16 per 17 wk US who presents at 7340w2d  presenting for c/o spotting this am while wiping x 2. Patient states she has been having no contractions, intact membranes, with active fetal movement. Pt reports falling to her knees last night, she did not hit her belly. No recent intercourse.   Prenatal Course Source of Care: [facility]  with onset of care at 19 weeks Pregnancy complications or risks: Proteinuria in pregnancy Depression - on Zoloft   Prenatal Transfer Tool   Past Medical History  Diagnosis Date  . Anemia during pregnancy   . Arthritis   . Asthma     Past Surgical History  Procedure Laterality Date  . No past surgeries      OB History  Gravida Para Term Preterm AB SAB TAB Ectopic Multiple Living  2 1 1       1     # Outcome Date GA Lbr Len/2nd Weight Sex Delivery Anes PTL Lv  2 Current           1 Term               Social History   Social History  . Marital Status: Married    Spouse Name: N/A  . Number of Children: N/A  . Years of Education: N/A   Social History Main Topics  . Smoking status: Former Smoker -- 1.00 packs/day for 2 years    Types: Cigarettes  . Smokeless tobacco: Never Used  . Alcohol Use: No  . Drug Use: No  . Sexual Activity: Yes   Other Topics Concern  . None   Social History Narrative    History reviewed. No pertinent family history.  Prescriptions prior to admission  Medication Sig Dispense Refill Last Dose  . beclomethasone (QVAR) 40 MCG/ACT inhaler Inhale 2 puffs into the lungs 2 (two) times daily.     . Prenatal Vit-Fe Fumarate-FA (MULTIVITAMIN-PRENATAL) 27-0.8 MG TABS tablet Take 1 tablet by mouth daily at 12 noon.   06/22/2016 at Unknown time  . sertraline (ZOLOFT) 25 MG tablet Take 50 mg by mouth at bedtime.    06/22/2016 at Unknown time  Proventil - prn  Allergies  Allergen Reactions  . Shellfish Allergy Anaphylaxis     Review of Systems: Negative except for what is mentioned in HPI.  Physical Exam: BP 101/54 mmHg  Pulse 92  Temp(Src) 98.3 F (36.8 C) (Oral)  LMP 10/17/2015 GENERAL: Well-developed, well-nourished female in no acute distress.  ABDOMEN: Soft, nontender, nondistended, gravid. EXTREMITIES: Nontender, no edema Cervical Exam: Dilatation 1 cm   Effacement 30 %   Station -3   Speculum exam: yellow/white discharge, scant bleeding at cervical os. Wet mount: + Whiff, + clue, neg trich, neg yeast FHT: Category: 1 Baseline rate 130 bpm   Variability moderate  Accelerations present   Decelerations none Contractions: irregular, not noted by pt   Pertinent Labs/Studies:   No results found for this or any previous visit (from the past 24 hour(s)).  Assessment : IUP at 7140w2d, bacterial vaginosis  Plan: Discharge Home  Preterm labor precautions - rec pelvic rest x 1-2 wks, avoid prolonged activity Rx for Flagyl given for treatment of BV F/u at next appt around 7/14 or earlier prn

## 2016-06-23 NOTE — OB Triage Note (Signed)
Pt arrived to obs rm 3 with c/o vaginal bleeding starting at 1030 this morning only happening when she wipes. Baby moving well and no leaking fluids. Pt placed on monitors and oriented to room.

## 2016-06-23 NOTE — Progress Notes (Addendum)
RN requesting I review strip as contractions seemed to have picked up after cervical exam. Category 1 FHR tracing, ctx still somewhat irregular with irritability in between, some as close as q 5 min Pt c/o mild cramping that sometimes take her breath away after exam, but seem less intense now. Cervix re-examined with no change noted. Will continue with plan to d/c home with PTL precautions.  Pt also encouraged to increase po fluid intake to 2 L/day minimum.

## 2016-06-29 ENCOUNTER — Encounter: Payer: Self-pay | Admitting: *Deleted

## 2016-06-29 ENCOUNTER — Observation Stay
Admission: EM | Admit: 2016-06-29 | Discharge: 2016-06-30 | Disposition: A | Discharge status: Home / Self Care | Payer: Medicaid Other | Attending: Obstetrics & Gynecology | Admitting: Obstetrics & Gynecology

## 2016-06-29 DIAGNOSIS — M1990 Unspecified osteoarthritis, unspecified site: Secondary | ICD-10-CM | POA: Insufficient documentation

## 2016-06-29 DIAGNOSIS — J45909 Unspecified asthma, uncomplicated: Secondary | ICD-10-CM | POA: Insufficient documentation

## 2016-06-29 DIAGNOSIS — O9989 Other specified diseases and conditions complicating pregnancy, childbirth and the puerperium: Secondary | ICD-10-CM | POA: Insufficient documentation

## 2016-06-29 DIAGNOSIS — O0992 Supervision of high risk pregnancy, unspecified, second trimester: Secondary | ICD-10-CM

## 2016-06-29 DIAGNOSIS — Z3A34 34 weeks gestation of pregnancy: Secondary | ICD-10-CM | POA: Insufficient documentation

## 2016-06-29 DIAGNOSIS — Z87891 Personal history of nicotine dependence: Secondary | ICD-10-CM | POA: Insufficient documentation

## 2016-06-29 DIAGNOSIS — O99513 Diseases of the respiratory system complicating pregnancy, third trimester: Secondary | ICD-10-CM | POA: Insufficient documentation

## 2016-06-29 DIAGNOSIS — Z91013 Allergy to seafood: Secondary | ICD-10-CM | POA: Insufficient documentation

## 2016-06-29 DIAGNOSIS — O4703 False labor before 37 completed weeks of gestation, third trimester: Principal | ICD-10-CM | POA: Insufficient documentation

## 2016-06-29 NOTE — OB Triage Note (Signed)
Pt. Starting contracting this morning, with contractions getting worst. Pt. Feels like contractions (abd. cramping) are 30 minutes apart. Positive for fetal movement, abd. soft upon palpation.  Denies gush of fluid and vaginal bleeding.

## 2016-06-30 DIAGNOSIS — M1990 Unspecified osteoarthritis, unspecified site: Secondary | ICD-10-CM | POA: Diagnosis not present

## 2016-06-30 DIAGNOSIS — Z91013 Allergy to seafood: Secondary | ICD-10-CM | POA: Diagnosis not present

## 2016-06-30 DIAGNOSIS — O99513 Diseases of the respiratory system complicating pregnancy, third trimester: Secondary | ICD-10-CM | POA: Diagnosis not present

## 2016-06-30 DIAGNOSIS — J45909 Unspecified asthma, uncomplicated: Secondary | ICD-10-CM | POA: Diagnosis not present

## 2016-06-30 DIAGNOSIS — Z87891 Personal history of nicotine dependence: Secondary | ICD-10-CM | POA: Diagnosis not present

## 2016-06-30 DIAGNOSIS — Z3A34 34 weeks gestation of pregnancy: Secondary | ICD-10-CM | POA: Diagnosis not present

## 2016-06-30 DIAGNOSIS — O4703 False labor before 37 completed weeks of gestation, third trimester: Secondary | ICD-10-CM | POA: Diagnosis present

## 2016-06-30 DIAGNOSIS — O9989 Other specified diseases and conditions complicating pregnancy, childbirth and the puerperium: Secondary | ICD-10-CM | POA: Diagnosis not present

## 2016-06-30 DIAGNOSIS — O0992 Supervision of high risk pregnancy, unspecified, second trimester: Secondary | ICD-10-CM

## 2016-06-30 MED ORDER — LACTATED RINGERS IV BOLUS (SEPSIS)
1000.0000 mL | Freq: Once | INTRAVENOUS | Status: AC
  Administered 2016-06-30: 1000 mL via INTRAVENOUS

## 2016-06-30 NOTE — Progress Notes (Signed)
IV bolus complete, one liter of LR over one hour given. Pt. States contractions are not as intense, but still feels some cramping. Pt. Instructed to return to L&D if contractions become more intense. Nurse encourage pt. To stay hydrated with water during the hot summer days, it is easy to become dehydrated with the intense heat.  Pt. Verbalized understanding.  Labor precautions also discussed with patient. Pt. Ready for discharge. Pt.'s mother and aunt at the bedside providing support.

## 2016-07-03 NOTE — Discharge Summary (Signed)
Fallynn L Tenny CrawRoss is a 20 y.o. female. She is at 6473w5d gestation.  Chief Complaint: contractinos  S: Location: uterus Context: has been having on-and-off contractions over the last several days, now persistent and becoming more frequent and stronger Onset/timing/duration: see above, contractions last about 30-45 seconds Quality: dull Severity: 4/10 at best, 8/10 at worst Aggravating factors: none Alleviating factors: none Associated signs/symptoms: no VB.no LOF,  Active fetal movement   Maternal Medical History:   Past Medical History  Diagnosis Date  . Anemia during pregnancy   . Arthritis   . Asthma     Past Surgical History  Procedure Laterality Date  . No past surgeries      Allergies  Allergen Reactions  . Shellfish Allergy Anaphylaxis    Prior to Admission medications   Medication Sig Start Date End Date Taking? Authorizing Provider  acetaminophen (TYLENOL) 325 MG tablet Take 325 mg by mouth every 4 (four) hours as needed. Reported on 06/23/2016   Yes Historical Provider, MD  Prenatal Vit-Fe Fumarate-FA (MULTIVITAMIN-PRENATAL) 27-0.8 MG TABS tablet Take 1 tablet by mouth daily at 12 noon.   Yes Historical Provider, MD  sertraline (ZOLOFT) 25 MG tablet Take 50 mg by mouth at bedtime.    Yes Historical Provider, MD  beclomethasone (QVAR) 40 MCG/ACT inhaler Inhale 2 puffs into the lungs 2 (two) times daily. Reported on 06/29/2016    Historical Provider, MD  ibuprofen (ADVIL,MOTRIN) 600 MG tablet Take 1 tablet (600 mg total) by mouth every 6 (six) hours as needed. Patient not taking: Reported on 05/11/2016 11/03/15   Tommi Rumpshonda L Summers, PA-C  lindane lotion (KWELL) 1 % Apply and leave over night and shower next morning Patient not taking: Reported on 05/11/2016 08/31/15   Tommi Rumpshonda L Summers, PA-C  ondansetron (ZOFRAN ODT) 8 MG disintegrating tablet Take 1 tablet (8 mg total) by mouth every 8 (eight) hours as needed for nausea or vomiting. Patient not taking: Reported on 05/11/2016  07/26/15   Sharman CheekPhillip Stafford, MD     [facility]   Social History: She  reports that she has quit smoking. Her smoking use included Cigarettes. She has a 2 pack-year smoking history. She has never used smokeless tobacco. She reports that she does not drink alcohol or use illicit drugs.  Family History: family history is not on file.   Review of Systems: A full review of systems was performed and negative except as noted in the HPI.     O:  BP 114/57 mmHg  Pulse 74  Temp(Src) 98.8 F (37.1 C) (Oral)  Resp 17  LMP 10/17/2015 No results found for this or any previous visit (from the past 48 hour(s)).   Constitutional: NAD, AAOx3  HE/ENT: extraocular movements grossly intact, moist mucous membranes CV: RRR PULM: nl respiratory effort, CTABL     Abd: gravid, non-tender, non-distended, soft      Ext: Non-tender, Nonedmeatous   Psych: mood appropriate, speech normal Pelvic: 1/long/high/posterior/firm  FHT: 125 mod + accels no decels TOCO: quiet    A/P:  19yo G2P1001 @ 34 weeks with preterm contractions.   Labor: not present.   Fetal Wellbeing: Reassuring Cat 1 tracing.  1L LR bolus and feeling better.  Was seen recently for same complaint, and no cervical change has occurred.  D/c home stable, precautions reviewed, follow-up as scheduled.   ----- Ranae Plumberhelsea Muneeb Veras, MD Attending Obstetrician and Gynecologist [facility]

## 2016-07-20 ENCOUNTER — Inpatient Hospital Stay
Admission: EM | Admit: 2016-07-20 | Discharge: 2016-07-20 | Disposition: A | Discharge status: Home / Self Care | Payer: Medicaid Other | Attending: Obstetrics & Gynecology | Admitting: Obstetrics & Gynecology

## 2016-07-20 DIAGNOSIS — Z3A39 39 weeks gestation of pregnancy: Secondary | ICD-10-CM | POA: Diagnosis not present

## 2016-07-20 NOTE — OB Triage Note (Signed)
Pt. Arrived to OBs 2. C/o ctx's since approx 2000. Positive fetal movement. Denies leaking of fluid or vaginal bleeding.

## 2016-07-20 NOTE — OB Triage Note (Signed)
Patient given discharge instructions. Verbalized understanding and agreed to plan of care. Patient in stable condition, ambulated accompanied by family members.

## 2016-07-20 NOTE — Discharge Instructions (Signed)
Keep regularly scheduled appointment. Return to hospital for increased painful contractions, leaking of fluid, vaginal bleeding, or decreased fetal movement.

## 2016-07-21 LAB — OB RESULTS CONSOLE GBS: GBS: NEGATIVE

## 2016-07-21 LAB — OB RESULTS CONSOLE GC/CHLAMYDIA
CHLAMYDIA, DNA PROBE: NEGATIVE
GC PROBE AMP, GENITAL: NEGATIVE

## 2016-07-22 DIAGNOSIS — I10 Essential (primary) hypertension: Secondary | ICD-10-CM

## 2016-07-22 HISTORY — DX: Essential (primary) hypertension: I10

## 2016-08-10 ENCOUNTER — Observation Stay
Admission: EM | Admit: 2016-08-10 | Discharge: 2016-08-10 | Disposition: A | Discharge status: Home / Self Care | Payer: Medicaid Other | Attending: Obstetrics and Gynecology | Admitting: Obstetrics and Gynecology

## 2016-08-10 ENCOUNTER — Encounter: Payer: Self-pay | Admitting: *Deleted

## 2016-08-10 DIAGNOSIS — Z3A00 Weeks of gestation of pregnancy not specified: Secondary | ICD-10-CM | POA: Insufficient documentation

## 2016-08-10 DIAGNOSIS — O479 False labor, unspecified: Secondary | ICD-10-CM | POA: Diagnosis present

## 2016-08-10 HISTORY — DX: Depression, unspecified: F32.A

## 2016-08-10 HISTORY — DX: Major depressive disorder, single episode, unspecified: F32.9

## 2016-08-10 MED ORDER — ACETAMINOPHEN 325 MG PO TABS: 650.0000 mg | ORAL_TABLET | ORAL | Status: DC | PRN

## 2016-08-10 NOTE — OB Triage Note (Signed)
Pt reports regular low abd  And back u/c's since approx 0100, q 

## 2016-08-10 NOTE — Final Progress Note (Signed)
Physician Final Progress Note  Patient ID: Nancy DikesCrystal L Ross MRN: [id number] DOB/AGE: 20/12/1995 20 y.o.  Admit date: 08/10/2016 Admitting provider: Vena AustriaAndreas Vylet Maffia, MD Discharge date: 08/10/2016   Admission Diagnoses: Contractions  Discharge Diagnoses:  Active Problems:   Irregular contractions   Consults: None  Significant Findings/ Diagnostic Studies:  Vitals:   08/10/16 0310  BP: 112/65  Pulse: 88  Resp: 20  Temp: 98.2 F (36.8 C)  TempSrc: Oral  Weight: 63.5 kg (140 lb)  Height: 4\' 11"  (1.499 m)     Procedures: NST reactive FHT 135, moderate, +accels, no decels.  Toco contraction q5810min  Discharge Condition: good  Disposition: 01-Home or Self Care  Diet: Regular diet  Discharge Activity: Activity as tolerated  Discharge Instructions    Discharge activity:  No Restrictions    Complete by:  As directed   Discharge diet:  No restrictions    Complete by:  As directed   Fetal Kick Count:  Lie on our left side for one hour after a meal, and count the number of times your baby kicks.  If it is less than 5 times, get up, move around and drink some juice.  Repeat the test 30 minutes later.  If it is still less than 5 kicks in an hour, notify your doctor.    Complete by:  As directed   LABOR:  When conractions begin, you should start to time them from the beginning of one contraction to the beginning  of the next.  When contractions are 5 - 10 minutes apart or less and have been regular for at least an hour, you should call your health care provider.    Complete by:  As directed   No sexual activity restrictions    Complete by:  As directed   Notify physician for bleeding from the vagina    Complete by:  As directed   Notify physician for blurring of vision or spots before the eyes    Complete by:  As directed   Notify physician for chills or fever    Complete by:  As directed   Notify physician for fainting spells, "black outs" or loss of consciousness    Complete by:  As  directed   Notify physician for increase in vaginal discharge    Complete by:  As directed   Notify physician for leaking of fluid    Complete by:  As directed   Notify physician for pain or burning when urinating    Complete by:  As directed   Notify physician for pelvic pressure (sudden increase)    Complete by:  As directed   Notify physician for severe or continued nausea or vomiting    Complete by:  As directed   Notify physician for sudden gushing of fluid from the vagina (with or without continued leaking)    Complete by:  As directed   Notify physician for sudden, constant, or occasional abdominal pain    Complete by:  As directed   Notify physician if baby moving less than usual    Complete by:  As directed       Medication List    STOP taking these medications   ibuprofen 600 MG tablet Commonly known as:  ADVIL,MOTRIN   lindane lotion 1 % Commonly known as:  KWELL   ondansetron 8 MG disintegrating tablet Commonly known as:  ZOFRAN ODT     TAKE these medications   acetaminophen 325 MG tablet Commonly known as:  TYLENOL Take  325 mg by mouth every 4 (four) hours as needed. Reported on 06/23/2016   beclomethasone 40 MCG/ACT inhaler Commonly known as:  QVAR Inhale 2 puffs into the lungs 2 (two) times daily. Reported on 06/29/2016   multivitamin-prenatal 27-0.8 MG Tabs tablet Take 1 tablet by mouth daily at 12 noon.   sertraline 25 MG tablet Commonly known as:  ZOLOFT Take 50 mg by mouth at bedtime.        Total time spent taking care of this patient: 10 minutes  Signed: Lorrene ReidSTAEBLER, Kason Benak M 08/10/2016, 4:20 AM

## 2016-08-13 ENCOUNTER — Inpatient Hospital Stay
Admission: EM | Admit: 2016-08-13 | Discharge: 2016-08-16 | DRG: 775 | Disposition: A | Discharge status: Home / Self Care | Payer: Medicaid Other | Attending: Obstetrics & Gynecology | Admitting: Obstetrics & Gynecology

## 2016-08-13 DIAGNOSIS — O99344 Other mental disorders complicating childbirth: Secondary | ICD-10-CM | POA: Diagnosis present

## 2016-08-13 DIAGNOSIS — M1990 Unspecified osteoarthritis, unspecified site: Secondary | ICD-10-CM | POA: Diagnosis present

## 2016-08-13 DIAGNOSIS — Z3A40 40 weeks gestation of pregnancy: Secondary | ICD-10-CM

## 2016-08-13 DIAGNOSIS — F329 Major depressive disorder, single episode, unspecified: Secondary | ICD-10-CM | POA: Diagnosis present

## 2016-08-13 DIAGNOSIS — O9902 Anemia complicating childbirth: Secondary | ICD-10-CM | POA: Diagnosis present

## 2016-08-13 DIAGNOSIS — Z79899 Other long term (current) drug therapy: Secondary | ICD-10-CM

## 2016-08-13 DIAGNOSIS — Z9889 Other specified postprocedural states: Secondary | ICD-10-CM

## 2016-08-13 DIAGNOSIS — O9952 Diseases of the respiratory system complicating childbirth: Principal | ICD-10-CM | POA: Diagnosis present

## 2016-08-13 DIAGNOSIS — Z87891 Personal history of nicotine dependence: Secondary | ICD-10-CM

## 2016-08-13 DIAGNOSIS — J45909 Unspecified asthma, uncomplicated: Secondary | ICD-10-CM | POA: Diagnosis present

## 2016-08-13 DIAGNOSIS — D62 Acute posthemorrhagic anemia: Secondary | ICD-10-CM | POA: Diagnosis present

## 2016-08-13 DIAGNOSIS — Z809 Family history of malignant neoplasm, unspecified: Secondary | ICD-10-CM

## 2016-08-13 HISTORY — DX: Other mental disorders complicating the puerperium: O99.345

## 2016-08-13 HISTORY — DX: Postpartum depression: F53.0

## 2016-08-14 ENCOUNTER — Inpatient Hospital Stay: Payer: Medicaid Other | Admitting: Registered Nurse

## 2016-08-14 ENCOUNTER — Encounter: Payer: Self-pay | Admitting: Advanced Practice Midwife

## 2016-08-14 DIAGNOSIS — J45909 Unspecified asthma, uncomplicated: Secondary | ICD-10-CM | POA: Diagnosis present

## 2016-08-14 DIAGNOSIS — O9952 Diseases of the respiratory system complicating childbirth: Secondary | ICD-10-CM | POA: Diagnosis present

## 2016-08-14 DIAGNOSIS — M1990 Unspecified osteoarthritis, unspecified site: Secondary | ICD-10-CM | POA: Diagnosis present

## 2016-08-14 DIAGNOSIS — O99344 Other mental disorders complicating childbirth: Secondary | ICD-10-CM | POA: Diagnosis present

## 2016-08-14 DIAGNOSIS — Z3A40 40 weeks gestation of pregnancy: Secondary | ICD-10-CM | POA: Diagnosis not present

## 2016-08-14 DIAGNOSIS — O9902 Anemia complicating childbirth: Secondary | ICD-10-CM | POA: Diagnosis present

## 2016-08-14 DIAGNOSIS — Z79899 Other long term (current) drug therapy: Secondary | ICD-10-CM | POA: Diagnosis not present

## 2016-08-14 DIAGNOSIS — Z809 Family history of malignant neoplasm, unspecified: Secondary | ICD-10-CM | POA: Diagnosis not present

## 2016-08-14 DIAGNOSIS — Z87891 Personal history of nicotine dependence: Secondary | ICD-10-CM | POA: Diagnosis not present

## 2016-08-14 DIAGNOSIS — D62 Acute posthemorrhagic anemia: Secondary | ICD-10-CM | POA: Diagnosis present

## 2016-08-14 DIAGNOSIS — Z9889 Other specified postprocedural states: Secondary | ICD-10-CM | POA: Diagnosis not present

## 2016-08-14 DIAGNOSIS — F329 Major depressive disorder, single episode, unspecified: Secondary | ICD-10-CM | POA: Diagnosis present

## 2016-08-14 LAB — CBC
HEMATOCRIT: 30.3 % — AB (ref 35.0–47.0)
Hemoglobin: 10.5 g/dL — ABNORMAL LOW (ref 12.0–16.0)
MCH: 27.5 pg (ref 26.0–34.0)
MCHC: 34.7 g/dL (ref 32.0–36.0)
MCV: 79.3 fL — ABNORMAL LOW (ref 80.0–100.0)
PLATELETS: 223 10*3/uL (ref 150–440)
RBC: 3.82 MIL/uL (ref 3.80–5.20)
RDW: 14.5 % (ref 11.5–14.5)
WBC: 9.3 10*3/uL (ref 3.6–11.0)

## 2016-08-14 LAB — CHLAMYDIA/NGC RT PCR (ARMC ONLY)
Chlamydia Tr: NOT DETECTED
N GONORRHOEAE: NOT DETECTED

## 2016-08-14 LAB — TYPE AND SCREEN
ABO/RH(D): B POS
ANTIBODY SCREEN: NEGATIVE

## 2016-08-14 MED ORDER — OXYCODONE-ACETAMINOPHEN 5-325 MG PO TABS
2.0000 | ORAL_TABLET | ORAL | Status: DC | PRN
Start: 2016-08-14 — End: 2016-08-16

## 2016-08-14 MED ORDER — MISOPROSTOL 200 MCG PO TABS
ORAL_TABLET | ORAL | Status: AC
  Filled 2016-08-14: qty 4

## 2016-08-14 MED ORDER — IBUPROFEN 600 MG PO TABS
600.0000 mg | ORAL_TABLET | Freq: Four times a day (QID) | ORAL | Status: DC
  Administered 2016-08-14 – 2016-08-16 (×8): 600 mg via ORAL
  Filled 2016-08-14 (×7): qty 1

## 2016-08-14 MED ORDER — LACTATED RINGERS IV SOLN
INTRAVENOUS | Status: DC
  Administered 2016-08-14 – 2016-08-15 (×3): via INTRAVENOUS

## 2016-08-14 MED ORDER — COCONUT OIL OIL
1.0000 "application " | TOPICAL_OIL | Status: DC | PRN
  Administered 2016-08-15: 1 via TOPICAL
  Filled 2016-08-14: qty 120

## 2016-08-14 MED ORDER — SIMETHICONE 80 MG PO CHEW: 80.0000 mg | CHEWABLE_TABLET | ORAL | Status: DC | PRN

## 2016-08-14 MED ORDER — OXYCODONE-ACETAMINOPHEN 5-325 MG PO TABS
1.0000 | ORAL_TABLET | ORAL | Status: DC | PRN
  Administered 2016-08-14 – 2016-08-16 (×3): 1 via ORAL
  Filled 2016-08-14 (×3): qty 1

## 2016-08-14 MED ORDER — LACTATED RINGERS IV SOLN
INTRAVENOUS | Status: DC
  Administered 2016-08-14 (×2): via INTRAVENOUS

## 2016-08-14 MED ORDER — FENTANYL 2.5 MCG/ML W/ROPIVACAINE 0.2% IN NS 100 ML EPIDURAL INFUSION (ARMC-ANES)
EPIDURAL | Status: DC | PRN
  Administered 2016-08-14: 9 mL/h via EPIDURAL

## 2016-08-14 MED ORDER — ZOLPIDEM TARTRATE 5 MG PO TABS: 5.0000 mg | ORAL_TABLET | Freq: Every evening | ORAL | Status: DC | PRN

## 2016-08-14 MED ORDER — ACETAMINOPHEN 325 MG PO TABS: 650.0000 mg | ORAL_TABLET | ORAL | Status: DC | PRN

## 2016-08-14 MED ORDER — OXYTOCIN 10 UNIT/ML IJ SOLN
INTRAMUSCULAR | Status: AC
  Filled 2016-08-14: qty 2

## 2016-08-14 MED ORDER — LIDOCAINE HCL (PF) 1 % IJ SOLN: 30.0000 mL | INTRAMUSCULAR | Status: DC | PRN

## 2016-08-14 MED ORDER — ACETAMINOPHEN 325 MG PO TABS
650.0000 mg | ORAL_TABLET | ORAL | Status: DC | PRN
  Administered 2016-08-15 – 2016-08-16 (×3): 650 mg via ORAL
  Filled 2016-08-14 (×3): qty 2

## 2016-08-14 MED ORDER — LIDOCAINE-EPINEPHRINE (PF) 1.5 %-1:200000 IJ SOLN
INTRAMUSCULAR | Status: DC | PRN
  Administered 2016-08-14: 3 mL via EPIDURAL

## 2016-08-14 MED ORDER — BUPIVACAINE HCL (PF) 0.25 % IJ SOLN
INTRAMUSCULAR | Status: DC | PRN
  Administered 2016-08-14: 4 mL via EPIDURAL

## 2016-08-14 MED ORDER — ONDANSETRON HCL 4 MG/2ML IJ SOLN: 4.0000 mg | INTRAMUSCULAR | Status: DC | PRN

## 2016-08-14 MED ORDER — PRENATAL MULTIVITAMIN CH
1.0000 | ORAL_TABLET | Freq: Every day | ORAL | Status: DC
  Administered 2016-08-15 – 2016-08-16 (×2): 1 via ORAL
  Filled 2016-08-14 (×2): qty 1

## 2016-08-14 MED ORDER — LACTATED RINGERS IV SOLN: 500.0000 mL | INTRAVENOUS | Status: DC | PRN

## 2016-08-14 MED ORDER — OXYTOCIN BOLUS FROM INFUSION
500.0000 mL | Freq: Once | INTRAVENOUS | Status: AC
  Administered 2016-08-14: 500 mL via INTRAVENOUS

## 2016-08-14 MED ORDER — ONDANSETRON HCL 4 MG/2ML IJ SOLN
4.0000 mg | Freq: Four times a day (QID) | INTRAMUSCULAR | Status: DC | PRN
  Administered 2016-08-14: 4 mg via INTRAVENOUS
  Filled 2016-08-14: qty 2

## 2016-08-14 MED ORDER — OXYTOCIN 40 UNITS IN LACTATED RINGERS INFUSION - SIMPLE MED
2.5000 [IU]/h | INTRAVENOUS | Status: DC
  Administered 2016-08-14: 2.5 [IU]/h via INTRAVENOUS

## 2016-08-14 MED ORDER — TERBUTALINE SULFATE 1 MG/ML IJ SOLN: 0.2500 mg | Freq: Once | INTRAMUSCULAR | Status: DC | PRN

## 2016-08-14 MED ORDER — LIDOCAINE HCL (PF) 1 % IJ SOLN
INTRAMUSCULAR | Status: DC | PRN
  Administered 2016-08-14: 3 mL via SUBCUTANEOUS

## 2016-08-14 MED ORDER — LIDOCAINE HCL (PF) 1 % IJ SOLN
INTRAMUSCULAR | Status: AC
  Filled 2016-08-14: qty 30

## 2016-08-14 MED ORDER — DIBUCAINE 1 % RE OINT: 1.0000 "application " | TOPICAL_OINTMENT | RECTAL | Status: DC | PRN

## 2016-08-14 MED ORDER — DOCUSATE SODIUM 100 MG PO CAPS
100.0000 mg | ORAL_CAPSULE | Freq: Two times a day (BID) | ORAL | Status: DC | PRN
  Administered 2016-08-15: 100 mg via ORAL
  Filled 2016-08-14: qty 1

## 2016-08-14 MED ORDER — SERTRALINE HCL 50 MG PO TABS
50.0000 mg | ORAL_TABLET | Freq: Every day | ORAL | Status: DC
  Administered 2016-08-14 – 2016-08-15 (×2): 50 mg via ORAL
  Filled 2016-08-14 (×2): qty 1

## 2016-08-14 MED ORDER — FENTANYL 2.5 MCG/ML W/ROPIVACAINE 0.2% IN NS 100 ML EPIDURAL INFUSION (ARMC-ANES)
EPIDURAL | Status: AC
Start: 2016-08-14 — End: 2016-08-14
  Filled 2016-08-14: qty 100

## 2016-08-14 MED ORDER — FERROUS SULFATE 325 (65 FE) MG PO TABS
325.0000 mg | ORAL_TABLET | Freq: Every day | ORAL | Status: DC
  Administered 2016-08-15 – 2016-08-16 (×2): 325 mg via ORAL
  Filled 2016-08-14 (×2): qty 1

## 2016-08-14 MED ORDER — AMMONIA AROMATIC IN INHA
RESPIRATORY_TRACT | Status: AC
  Filled 2016-08-14: qty 10

## 2016-08-14 MED ORDER — BUTORPHANOL TARTRATE 1 MG/ML IJ SOLN
1.0000 mg | INTRAMUSCULAR | Status: DC | PRN
  Administered 2016-08-14 (×2): 1 mg via INTRAVENOUS
  Filled 2016-08-14 (×2): qty 1

## 2016-08-14 MED ORDER — ONDANSETRON HCL 4 MG PO TABS
4.0000 mg | ORAL_TABLET | ORAL | Status: DC | PRN
  Administered 2016-08-15: 4 mg via ORAL
  Filled 2016-08-14: qty 1

## 2016-08-14 MED ORDER — WITCH HAZEL-GLYCERIN EX PADS: 1.0000 "application " | MEDICATED_PAD | CUTANEOUS | Status: DC | PRN

## 2016-08-14 MED ORDER — OXYTOCIN 40 UNITS IN LACTATED RINGERS INFUSION - SIMPLE MED
1.0000 m[IU]/min | INTRAVENOUS | Status: DC
  Administered 2016-08-14: 1 m[IU]/min via INTRAVENOUS
  Filled 2016-08-14: qty 1000

## 2016-08-14 MED ORDER — DIPHENHYDRAMINE HCL 25 MG PO CAPS: 25.0000 mg | ORAL_CAPSULE | Freq: Four times a day (QID) | ORAL | Status: DC | PRN

## 2016-08-14 MED ORDER — BENZOCAINE-MENTHOL 20-0.5 % EX AERO: 1.0000 "application " | INHALATION_SPRAY | CUTANEOUS | Status: DC | PRN

## 2016-08-14 NOTE — Anesthesia Procedure Notes (Signed)
Epidural Patient location during procedure: OB Start time: 08/14/2016 7:28 AM End time: 08/14/2016 7:42 AM  Staffing Anesthesiologist: Priscella MannPENWARDEN, AMY Resident/CRNA: Stormy FabianURTIS, Ennifer Harston Performed: resident/CRNA   Preanesthetic Checklist Completed: patient identified, site marked, surgical consent, pre-op evaluation, IV checked, risks and benefits discussed and monitors and equipment checked  Epidural Patient position: sitting Prep: Betadine Patient monitoring: heart rate, continuous pulse ox and blood pressure Approach: midline Location: L3-L4 Injection technique: LOR saline  Needle:  Needle type: Tuohy  Needle gauge: 17 G Needle length: 9 cm Needle insertion depth: 5 cm Catheter type: closed end flexible Catheter size: 19 Gauge Catheter at skin depth: 10 cm Test dose: negative and 1.5% lidocaine with Epi 1:200 K  Assessment Events: blood not aspirated, injection not painful, no injection resistance, negative IV test and no paresthesia  Additional Notes Reason for block:procedure for pain

## 2016-08-14 NOTE — Progress Notes (Signed)
  Labor Progress Note   20 y.o. G2P1001 @ 3572w5d , admitted for  Pregnancy, Labor Management. Labor Augmentation  Subjective:  Pt is doing well coping. She says her pain is increasing and she feels some pressure. She is requesting an epidural.  Objective:  BP (!) 98/49   Pulse 66   Temp 98.6 F (37 C) (Oral)   Resp 20   Ht 4\' 11"  (1.499 m)   Wt 64.4 kg (142 lb)   LMP 10/17/2015   BMI 28.68 kg/m  Abd: mild Extr: trace to 1+ bilateral pedal edema SVE: CERVIX: 6 cm dilated, 70 effaced, -2 station  EFM: FHR: 120 bpm, variability: moderate,  accelerations:  Present,  decelerations:  Absent Toco: Frequency: Every 2-3 minutes Labs: I have reviewed the patient's lab results.   Assessment & Plan:  G2P1001 @ 3772w5d, admitted for  Pregnancy and Labor/Delivery Management  1. Pain management: epidural. 2. FWB: FHT category I.  3. ID: GBS negative 4. Labor management: Expectant management for SVD, Pitocin augmentation All discussed with patient, see orders   Jose Corvin, CNM

## 2016-08-14 NOTE — Anesthesia Preprocedure Evaluation (Signed)
Anesthesia Evaluation  Patient identified by MRN, date of birth, ID band Patient awake    Reviewed: Allergy & Precautions, NPO status , Patient's Chart, lab work & pertinent test results  History of Anesthesia Complications Negative for: history of anesthetic complications  Airway Mallampati: II       Dental no notable dental hx.    Pulmonary asthma , former smoker,           Cardiovascular negative cardio ROS       Neuro/Psych PSYCHIATRIC DISORDERS Depression    GI/Hepatic   Endo/Other    Renal/GU      Musculoskeletal  (+) Arthritis ,   Abdominal   Peds  Hematology  (+) anemia ,   Anesthesia Other Findings   Reproductive/Obstetrics (+) Pregnancy                             Anesthesia Physical Anesthesia Plan  ASA: II  Anesthesia Plan: Epidural   Post-op Pain Management:    Induction:   Airway Management Planned:   Additional Equipment:   Intra-op Plan:   Post-operative Plan:   Informed Consent: I have reviewed the patients History and Physical, chart, labs and discussed the procedure including the risks, benefits and alternatives for the proposed anesthesia with the patient or authorized representative who has indicated his/her understanding and acceptance.     Plan Discussed with: Anesthesiologist  Anesthesia Plan Comments:         Anesthesia Quick Evaluation

## 2016-08-14 NOTE — Progress Notes (Signed)
0840:  S: Feeling comfortable with epidural O: Cervix 7/90/-1, VSS, cat 1 tracing, regular ctx A: IUP at 10449w5d, labor P: AROM with copious amount of clear fluid noted.  Anticipate vaginal delivery   0900:  Pt c/o pressure and pain on left side. Cervix 8/90/-1. Pt repositioned.  RN to contact anesthesia if pain unresolved. Anticipate SVD soon.

## 2016-08-14 NOTE — Lactation Note (Signed)
Lactation Consultation Note  Patient Name: Nancy DikesCrystal L Ross ZOXWR'UToday's Date: 08/14/2016 Reason for consult: Initial assessment   Maternal Data Has patient been taught Hand Expression?: Yes Does the patient have breastfeeding experience prior to this delivery?: Yes  Feeding Feeding Type: Breast Fed  LATCH Score/Interventions Latch: Grasps breast easily, tongue down, lips flanged, rhythmical sucking.  Audible Swallowing: Spontaneous and intermittent  Type of Nipple: Everted at rest and after stimulation  Comfort (Breast/Nipple): Soft / non-tender   Baby has a high suck drive and may need a pacifier.  Hold (Positioning): No assistance needed to correctly position infant at breast.  LATCH Score: 10  Lactation Tools Discussed/Used     Consult Status      Trudee GripCarolyn P Margaux Engen 08/14/2016, 2:02 PM

## 2016-08-14 NOTE — Discharge Summary (Signed)
Obstetric Discharge Summary Reason for Admission: onset of labor Delivery Type: spontaneous vaginal delivery Postpartum Procedures: none Complications-Intrapartum or Postpartum: none  Recent Labs  08/14/16 0059 08/15/16 0543  HGB 10.5* 11.5*  HCT 30.3* 33.1*   Gestational Age at Delivery: 469w5d  Antepartum complications: anemia and depression (on zoloft)  Date of Delivery: 08/14/16  Delivered By: Kerney ElbeKB, CNM   Physical Exam:  General: alert and cooperative Lochia: appropriate Uterine Fundus: firm Incision: N/A DVT Evaluation: No evidence of DVT seen on physical exam. Abdomen: abdomen is soft without significant tenderness, masses, organomegaly or guarding  Prenatal Labs Blood Type: B+ Rubella: Immune Varicella: Immune TDAP: Up to date and Given during pregnancy Feeding: Breast Contraception: IUD (needs to order at office)  Discharge Diagnoses: Term Pregnancy-delivered  Discharge Information: Date: 08/16/2016 Activity: pelvic rest Diet: routine Medications: PNV and Ibuprofen Condition: stable Instructions:  Discharge instructions:   Call office if you have any of the following: headache, visual changes, fever >100 F, chills, breast concerns, excessive vaginal bleeding, incision drainage or problems, leg pain or redness, depression or any other concerns.   Activity: Do not lift > 10 lbs for 6 weeks.  No intercourse or tampons for 6 weeks.  No driving for 1-2 weeks.   Discharge to: home Follow-up Information    Brothers, Delaney Meigsamara, [place] Follow up in 6 week(s).   Specialty:  Certified Nurse Midwife Contact information: [address] [place] [address] [phone]           Newborn Data: Live born female  Birth Weight: 7 lb 6.2 oz (3350 g) APGAR: 8, 8  Home with mother.  Letitia Libraobert Paul Wesleigh Markovic, MD 08/16/2016, 9:00 AM

## 2016-08-14 NOTE — H&P (Signed)
OB History & Physical   History of Present Illness:  Chief Complaint: Contractions  HPI:  Nancy Ball is a 20 y.o. 722P1001 female at 305w5d dated by U/S.  Her pregnancy has been complicated by Asthma, late onset of prenatal care, depression on zoloft, mild anemia.    She reports contractions.   She denies leakage of fluid.   She denies vaginal bleeding.   She reports fetal movement.    Maternal Medical History:   Past Medical History:  Diagnosis Date  . Anemia during pregnancy   . 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 Asthma    well controlled, last episode in high school with running track  . Depression    PP depression, on zoloft presently  . Postpartum depression     Past Surgical History:  Procedure Laterality Date  . NO PAST SURGERIES    . WISDOM TOOTH EXTRACTION Bilateral 2013   states she was put to sleep    Allergies  Allergen Reactions  . Shellfish Allergy Anaphylaxis    Prior to Admission medications   Medication Sig Start Date End Date Taking? Authorizing Provider  acetaminophen (TYLENOL) 325 MG tablet Take 325 mg by mouth every 4 (four) hours as needed. Reported on 06/23/2016   Yes Historical Provider, MD  Prenatal Vit-Fe Fumarate-FA (MULTIVITAMIN-PRENATAL) 27-0.8 MG TABS tablet Take 1 tablet by mouth daily at 12 noon.   Yes Historical Provider, MD  beclomethasone (QVAR) 40 MCG/ACT inhaler Inhale 2 puffs into the lungs 2 (two) times daily. Reported on 06/29/2016    Historical Provider, MD  sertraline (ZOLOFT) 25 MG tablet Take 50 mg by mouth at bedtime.     Historical Provider, MD    OB History  Gravida Para Term Preterm AB Living  2 1 1     1   SAB TAB Ectopic Multiple Live Births          1    # Outcome Date GA Lbr Len/2nd Weight Sex Delivery Anes PTL Lv  2 Current           1 Term 07/25/14 3086w1d   F Vag-Spont EPI  LIV     Complications: Low iron    Obstetric Comments  States low Iron with  previous labor and with tis pregnancy    Prenatal care site: [facility]  Social History: She  reports that she quit smoking about 5 months ago. Her smoking use included Cigarettes. She has a 1.00 pack-year smoking history. She has never used smokeless tobacco. She reports that she does not drink alcohol or use drugs.  Family History: family history includes Cancer in her paternal aunt.   Review of Systems: Negative x 10 systems reviewed except as noted in the HPI.    Physical Exam:  Vital Signs: BP 113/69 (BP Location: Left Arm)   Pulse 86   Temp 98.6 F (37 C) (Oral)   Resp 18   Ht 4\' 11"  (1.499 m)   Wt 64.4 kg (142 lb)   LMP 10/17/2015   BMI 28.68 kg/m  General: no acute distress.  HEENT: normocephalic, atraumatic Heart: regular rate & rhythm.  No murmurs/rubs/gallops Lungs: clear to auscultation bilaterally Abdomen: soft, gravid, non-tender;  EFW: 6.5 to 7 pounds Pelvic:External: Normal external female genitalia  Cervix: Dilation: 3.5 / Effacement (%): 60 / Station: -2   Extremities: non-tender, symmetric, no edema bilaterally.  DTRs: 2+  Neurologic: Alert & oriented x 3.  Pertinent Results:  Prenatal Labs: Blood type/Rh B positive  Antibody screen negative  Rubella Immune  Varicella Immune    RPR Non Reactive  HBsAg negative  HIV negative  GC negative  Chlamydia negative  Genetic screening negative  1 hour GTT 128 on 5/26  3 hour GTT NA  GBS negative on 7/31   Baseline FHR: 135 beats/min   Variability: moderate   Accelerations: present   Decelerations: absent Contractions: present frequency: q 5min Overall assessment: Category I    Assessment:  Nancy Ball is a 20 y.o. 322P1001 female at 351w5d with regular contractions.   Plan:  1. Admit to Labor & Delivery  2. CBC, T&S, Clrs, IVF 3. GBS negative.   4. Fetal well-being: Category I 5. Expectant management for SVD, Pitocin for augmentation  [name], CNM 08/14/2016 12:14 AM

## 2016-08-14 NOTE — Plan of Care (Signed)
Problem: Coping: Goal: Ability to verbalize concerns and feelings about labor and delivery will improve Outcome: Progressing Pt admitted for labor, states she has low pain tolerance, plans for get Epidural. Discusses labor progression and what to expect, s/s that pt should report reviewed. Consent obtained, understanding verbalized. Pt ready for IV pain med, rates contractions and labor pain 8-9/10, Stadol 1mg  given sivp.

## 2016-08-15 LAB — CBC
HCT: 33.1 % — ABNORMAL LOW (ref 35.0–47.0)
Hemoglobin: 11.5 g/dL — ABNORMAL LOW (ref 12.0–16.0)
MCH: 27.2 pg (ref 26.0–34.0)
MCHC: 34.7 g/dL (ref 32.0–36.0)
MCV: 78.6 fL — AB (ref 80.0–100.0)
PLATELETS: 188 10*3/uL (ref 150–440)
RBC: 4.22 MIL/uL (ref 3.80–5.20)
RDW: 14 % (ref 11.5–14.5)
WBC: 11.8 10*3/uL — AB (ref 3.6–11.0)

## 2016-08-15 LAB — RPR: RPR: NONREACTIVE

## 2016-08-15 MED ORDER — AMMONIA AROMATIC IN INHA
RESPIRATORY_TRACT | Status: AC
  Filled 2016-08-15: qty 10

## 2016-08-15 NOTE — Progress Notes (Signed)
  Subjective:  Reports feeling dizzy with ambulation, lochia moderate.  No CP, SOB  Objective:   Blood pressure 115/61, pulse 70, temperature 98.2 F (36.8 C), temperature source Oral, resp. rate 18, height 4\' 11"  (1.499 m), weight 64.4 kg (142 lb), last menstrual period 10/17/2015, SpO2 98 %, unknown if currently breastfeeding.  General: NAD Pulmonary: no increased work of breathing Abdomen: non-distended, non-tender, fundus firm at level of umbilicus Extremities: no edema, no erythema, no tenderness  Results for orders placed or performed during the hospital encounter of 08/13/16 (from the past 72 hour(s))  CBC     Status: Abnormal   Collection Time: 08/14/16 12:59 AM  Result Value Ref Range   WBC 9.3 3.6 - 11.0 K/uL   RBC 3.82 3.80 - 5.20 MIL/uL   Hemoglobin 10.5 (L) 12.0 - 16.0 g/dL   HCT 59.530.3 (L) 63.835.0 - 75.647.0 %   MCV 79.3 (L) 80.0 - 100.0 fL   MCH 27.5 26.0 - 34.0 pg   MCHC 34.7 32.0 - 36.0 g/dL   RDW 43.314.5 29.511.5 - 18.814.5 %   Platelets 223 150 - 440 K/uL  Type and screen [facility]     Status: None   Collection Time: 08/14/16 12:59 AM  Result Value Ref Range   ABO/RH(D) B POS    Antibody Screen NEG    Sample Expiration 08/17/2016   RPR     Status: None   Collection Time: 08/14/16 12:59 AM  Result Value Ref Range   RPR Ser Ql Non Reactive Non Reactive    Comment: (NOTE) Performed At: [facility] [address] [place], [place] [id number] Mila HomerHancock William F MD SW:[phone]   Chlamydia/NGC rt PCR ([facility] only)     Status: None   Collection Time: 08/14/16  1:00 AM  Result Value Ref Range   Specimen source GC/Chlam URINE, RANDOM    Chlamydia Tr NOT DETECTED NOT DETECTED   N gonorrhoeae NOT DETECTED NOT DETECTED    Comment: (NOTE) 100  This methodology has not been evaluated in pregnant women or in 200  patients with a history of hysterectomy. 300 400  This methodology will not be performed on patients less than 4114  years of age.   CBC      Status: Abnormal   Collection Time: 08/15/16  5:43 AM  Result Value Ref Range   WBC 11.8 (H) 3.6 - 11.0 K/uL   RBC 4.22 3.80 - 5.20 MIL/uL   Hemoglobin 11.5 (L) 12.0 - 16.0 g/dL   HCT 22.033.1 (L) 25.435.0 - 27.047.0 %   MCV 78.6 (L) 80.0 - 100.0 fL   MCH 27.2 26.0 - 34.0 pg   MCHC 34.7 32.0 - 36.0 g/dL   RDW 62.314.0 76.211.5 - 83.114.5 %   Platelets 188 150 - 440 K/uL    Assessment:   20 y.o. D1V6160G2P2002 postpartum day #1 TSVD  Plan:    1) Acute blood loss anemia - hemodynamically stable, H&H actually rose post delivery so anticipate slightly dehydration as cause of dizzyness - po ferrous sulfate  2) --/--/B POS (08/24 0059) / Immune (03/13 0000) / Varicella Immune  3) TDAP UTD  4) Breast/IUD  5) Disposition anticipate discharge PPD2

## 2016-08-15 NOTE — Anesthesia Postprocedure Evaluation (Signed)
Anesthesia Post Note  Patient: Nancy Ball  Procedure(s) Performed: * No procedures listed *  Patient location during evaluation: Mother Baby Anesthesia Type: Epidural Level of consciousness: awake Pain management: satisfactory to patient Vital Signs Assessment: post-procedure vital signs reviewed and stable Cardiovascular status: stable Postop Assessment: no backache and no headache Anesthetic complications: no    Last Vitals:  Vitals:   08/15/16 0011 08/15/16 0329  BP: 129/82 130/86  Pulse: (!) 57 (!) 55  Resp: 18 18  Temp: 36.9 C 36.6 C    Last Pain:  Vitals:   08/15/16 0329  TempSrc: Oral  PainSc:                  [name],  Sher Shampine P

## 2016-08-15 NOTE — Progress Notes (Signed)
Pt reported having chest tightness while getting up to the BR. States she had an episode earlier today that resolved. This episode also resolved.  Paged Dr Bonney AidStaebler, informed him. No new orders at this time. States to monitor pt. If it happens again to get an EKG

## 2016-08-16 NOTE — Progress Notes (Signed)
D/C order from MD.  Reviewed d/c instructions and prescriptions with patient and answered any questions.  Patient d/c home with infant via wheelchair by nursing/auxillary. 

## 2016-08-16 NOTE — Discharge Instructions (Signed)
Your baby needs to eat every 2 to 3 hours during the day, and every 4 to 5 hours during the night (8 feedings per 24 hours)  Normally newborn babies will have 6 to 8 wet diapers per day and up to 3 or 4 BM's as well.  Babies need to sleep in a crib on their back with no extra blankets, pillows, stuffed animals etc., and NEVER IN THE BED WITH OTHER CHILDREN OR ADULTS.  The umbilical cord should fall off within 1 to 2 weeks---until then please keep the area clean and dry.  There may be some oozing when it falls off (like a scab), but not any bleeding.  If it looks infected call your Pediatrician.  Reasons to call your Pediatrician:    *If your baby is running a fever greater than 99.0    *if your baby is not eating well or having enough wet/BM diapers   *if your baby ever looks yellow (jaundice)  *if your baby has any noisy/fast breathing,sounds congested,or wheezing  *if your baby looks blue or pale call 911  Discharge instructions:   Call office if you have any of the following: headache, visual changes, fever >100 F, chills, breast concerns, excessive vaginal bleeding, incision drainage or problems, leg pain or redness, depression or any other concerns.   Activity: Do not lift > 10 lbs for 6 weeks.  No intercourse or tampons for 6 weeks.  No driving for 1-2 weeks.   Vaginal Delivery, Care After Refer to this sheet in the next few weeks. These discharge instructions provide you with information on caring for yourself after delivery. Your health care provider may also give you specific instructions. Your treatment has been planned according to the most current medical practices available, but problems sometimes occur. Call your health care provider if you have any problems or questions after you go home. HOME CARE INSTRUCTIONS  Take over-the-counter or prescription medicines only as directed by your health care provider or pharmacist.  Do not drink alcohol, especially if you are  breastfeeding or taking medicine to relieve pain.  Do not chew or smoke tobacco.  Do not use illegal drugs.  Continue to use good perineal care. Good perineal care includes:  Wiping your perineum from front to back.  Keeping your perineum clean.  Do not use tampons or douche until your health care provider says it is okay.  Shower, wash your hair, and take tub baths as directed by your health care provider.  Wear a well-fitting bra that provides breast support.  Eat healthy foods.  Drink enough fluids to keep your urine clear or pale yellow.  Eat high-fiber foods such as whole grain cereals and breads, brown rice, beans, and fresh fruits and vegetables every day. These foods may help prevent or relieve constipation.  Follow your health care provider's recommendations regarding resumption of activities such as climbing stairs, driving, lifting, exercising, or traveling.  Talk to your health care provider about resuming sexual activities. Resumption of sexual activities is dependent upon your risk of infection, your rate of healing, and your comfort and desire to resume sexual activity.  Try to have someone help you with your household activities and your newborn for at least a few days after you leave the hospital.  Rest as much as possible. Try to rest or take a nap when your newborn is sleeping.  Increase your activities gradually.  Keep all of your scheduled postpartum appointments. It is very important to keep your scheduled  follow-up appointments. At these appointments, your health care provider will be checking to make sure that you are healing physically and emotionally. SEEK MEDICAL CARE IF:   You are passing large clots from your vagina. Save any clots to show your health care provider.  You have a foul smelling discharge from your vagina.  You have trouble urinating.  You are urinating frequently.  You have pain when you urinate.  You have a change in your  bowel movements.  You have increasing redness, pain, or swelling near your vaginal incision (episiotomy) or vaginal tear.  You have pus draining from your episiotomy or vaginal tear.  Your episiotomy or vaginal tear is separating.  You have painful, hard, or reddened breasts.  You have a severe headache.  You have blurred vision or see spots.  You feel sad or depressed.  You have thoughts of hurting yourself or your newborn.  You have questions about your care, the care of your newborn, or medicines.  You are dizzy or light-headed.  You have a rash.  You have nausea or vomiting.  You were breastfeeding and have not had a menstrual period within 12 weeks after you stopped breastfeeding.  You are not breastfeeding and have not had a menstrual period by the 12th week after delivery.  You have a fever. SEEK IMMEDIATE MEDICAL CARE IF:   You have persistent pain.  You have chest pain.  You have shortness of breath.  You faint.  You have leg pain.  You have stomach pain.  Your vaginal bleeding saturates two or more sanitary pads in 1 hour.   This information is not intended to replace advice given to you by your health care provider. Make sure you discuss any questions you have with your health care provider.   Document Released: 12/05/2000 Document Revised: 08/29/2015 Document Reviewed: 08/04/2012 Elsevier Interactive Patient Education [employer]2016 [employer]

## 2016-08-16 NOTE — Progress Notes (Signed)
  Subjective:  Normal diet, ambulation, lochia moderate.  No CP, SOB  Objective:   Blood pressure 115/61, pulse 70, temperature 98.2 F (36.8 C), temperature source Oral, resp. rate 18, height 4\' 11"  (1.499 m), weight 64.4 kg (142 lb), last menstrual period 10/17/2015, SpO2 98 %, unknown if currently breastfeeding.  General: NAD Pulmonary: no increased work of breathing Abdomen: non-distended, non-tender, fundus firm at level of umbilicus Extremities: no edema, no erythema, no tenderness  Results for orders placed or performed during the hospital encounter of 08/13/16 (from the past 72 hour(s))  CBC     Status: Abnormal   Collection Time: 08/14/16 12:59 AM  Result Value Ref Range   WBC 9.3 3.6 - 11.0 K/uL   RBC 3.82 3.80 - 5.20 MIL/uL   Hemoglobin 10.5 (L) 12.0 - 16.0 g/dL   HCT 78.230.3 (L) 95.635.0 - 21.347.0 %   MCV 79.3 (L) 80.0 - 100.0 fL   MCH 27.5 26.0 - 34.0 pg   MCHC 34.7 32.0 - 36.0 g/dL   RDW 08.614.5 57.811.5 - 46.914.5 %   Platelets 223 150 - 440 K/uL  Type and screen [facility]     Status: None   Collection Time: 08/14/16 12:59 AM  Result Value Ref Range   ABO/RH(D) B POS    Antibody Screen NEG    Sample Expiration 08/17/2016   RPR     Status: None   Collection Time: 08/14/16 12:59 AM  Result Value Ref Range   RPR Ser Ql Non Reactive Non Reactive    Comment: (NOTE) Performed At: [facility] [address] [place], [place] [id number] Mila HomerHancock William F MD KG:[id number]:[phone]   Chlamydia/NGC rt PCR ([facility] only)     Status: None   Collection Time: 08/14/16  1:00 AM  Result Value Ref Range   Specimen source GC/Chlam URINE, RANDOM    Chlamydia Tr NOT DETECTED NOT DETECTED   N gonorrhoeae NOT DETECTED NOT DETECTED    Comment: (NOTE) 100  This methodology has not been evaluated in pregnant women or in 200  patients with a history of hysterectomy. 300 400  This methodology will not be performed on patients less than 1914  years of age.   CBC     Status:  Abnormal   Collection Time: 08/15/16  5:43 AM  Result Value Ref Range   WBC 11.8 (H) 3.6 - 11.0 K/uL   RBC 4.22 3.80 - 5.20 MIL/uL   Hemoglobin 11.5 (L) 12.0 - 16.0 g/dL   HCT 64.433.1 (L) 03.435.0 - 74.247.0 %   MCV 78.6 (L) 80.0 - 100.0 fL   MCH 27.2 26.0 - 34.0 pg   MCHC 34.7 32.0 - 36.0 g/dL   RDW 59.514.0 63.811.5 - 75.614.5 %   Platelets 188 150 - 440 K/uL    Assessment:   20 y.o. [id number] postpartum day #2 TSVD  Plan:    1) Acute blood loss anemia - hemodynamically stable, H&H actually rose post delivery - po ferrous sulfate  2) --/--/B POS (08/24 0059) / Immune (03/13 0000) / Varicella Immune  3) TDAP UTD  4) Breast/IUD  5) Disposition discharge PPD2

## 2016-08-23 ENCOUNTER — Emergency Department: Payer: Medicaid Other

## 2016-08-23 ENCOUNTER — Observation Stay
Admission: EM | Admit: 2016-08-23 | Discharge: 2016-08-24 | Disposition: A | Discharge status: Home / Self Care | Payer: Medicaid Other | Source: Home / Self Care | Attending: Emergency Medicine | Admitting: Emergency Medicine

## 2016-08-23 DIAGNOSIS — O1495 Unspecified pre-eclampsia, complicating the puerperium: Secondary | ICD-10-CM

## 2016-08-23 DIAGNOSIS — J45909 Unspecified asthma, uncomplicated: Secondary | ICD-10-CM

## 2016-08-23 DIAGNOSIS — Z87891 Personal history of nicotine dependence: Secondary | ICD-10-CM

## 2016-08-23 DIAGNOSIS — G43909 Migraine, unspecified, not intractable, without status migrainosus: Secondary | ICD-10-CM | POA: Insufficient documentation

## 2016-08-23 DIAGNOSIS — O165 Unspecified maternal hypertension, complicating the puerperium: Secondary | ICD-10-CM

## 2016-08-23 DIAGNOSIS — O1490 Unspecified pre-eclampsia, unspecified trimester: Secondary | ICD-10-CM | POA: Diagnosis present

## 2016-08-23 DIAGNOSIS — M1990 Unspecified osteoarthritis, unspecified site: Secondary | ICD-10-CM | POA: Insufficient documentation

## 2016-08-23 DIAGNOSIS — Z809 Family history of malignant neoplasm, unspecified: Secondary | ICD-10-CM

## 2016-08-23 DIAGNOSIS — F329 Major depressive disorder, single episode, unspecified: Secondary | ICD-10-CM

## 2016-08-23 LAB — COMPREHENSIVE METABOLIC PANEL
ALK PHOS: 111 U/L (ref 38–126)
ALT: 13 U/L — AB (ref 14–54)
AST: 17 U/L (ref 15–41)
Albumin: 3.4 g/dL — ABNORMAL LOW (ref 3.5–5.0)
Anion gap: 5 (ref 5–15)
BILIRUBIN TOTAL: 0.1 mg/dL — AB (ref 0.3–1.2)
BUN: 13 mg/dL (ref 6–20)
CALCIUM: 9.1 mg/dL (ref 8.9–10.3)
CHLORIDE: 110 mmol/L (ref 101–111)
CO2: 24 mmol/L (ref 22–32)
CREATININE: 0.5 mg/dL (ref 0.44–1.00)
Glucose, Bld: 85 mg/dL (ref 65–99)
Potassium: 3.8 mmol/L (ref 3.5–5.1)
Sodium: 139 mmol/L (ref 135–145)
Total Protein: 6.8 g/dL (ref 6.5–8.1)

## 2016-08-23 LAB — URINALYSIS COMPLETE WITH MICROSCOPIC (ARMC ONLY)
BILIRUBIN URINE: NEGATIVE
GLUCOSE, UA: NEGATIVE mg/dL
KETONES UR: NEGATIVE mg/dL
Nitrite: NEGATIVE
Protein, ur: NEGATIVE mg/dL
SPECIFIC GRAVITY, URINE: 1.003 — AB (ref 1.005–1.030)
SQUAMOUS EPITHELIAL / LPF: NONE SEEN
pH: 7 (ref 5.0–8.0)

## 2016-08-23 LAB — CBC
HCT: 37.7 % (ref 35.0–47.0)
Hemoglobin: 12.8 g/dL (ref 12.0–16.0)
MCH: 26.7 pg (ref 26.0–34.0)
MCHC: 34.1 g/dL (ref 32.0–36.0)
MCV: 78.3 fL — AB (ref 80.0–100.0)
PLATELETS: 362 10*3/uL (ref 150–440)
RBC: 4.81 MIL/uL (ref 3.80–5.20)
RDW: 14.5 % (ref 11.5–14.5)
WBC: 7.3 10*3/uL (ref 3.6–11.0)

## 2016-08-23 LAB — PROTEIN / CREATININE RATIO, URINE
CREATININE, URINE: 20 mg/dL
PROTEIN CREATININE RATIO: 0.4 mg/mg{creat} — AB (ref 0.00–0.15)
TOTAL PROTEIN, URINE: 8 mg/dL

## 2016-08-23 MED ORDER — MAGNESIUM SULFATE 2 GM/50ML IV SOLN
2.0000 g | Freq: Once | INTRAVENOUS | Status: AC
  Administered 2016-08-23: 2 g via INTRAVENOUS
  Filled 2016-08-23: qty 50

## 2016-08-23 MED ORDER — METOCLOPRAMIDE HCL 5 MG/ML IJ SOLN
10.0000 mg | Freq: Once | INTRAMUSCULAR | Status: AC
  Administered 2016-08-23: 10 mg via INTRAVENOUS
  Filled 2016-08-23: qty 2

## 2016-08-23 MED ORDER — PRENATAL 27-0.8 MG PO TABS
1.0000 | ORAL_TABLET | Freq: Every day | ORAL | Status: DC
  Administered 2016-08-23: 1 via ORAL
  Filled 2016-08-23 (×2): qty 1

## 2016-08-23 MED ORDER — DIPHENHYDRAMINE HCL 50 MG/ML IJ SOLN
25.0000 mg | Freq: Once | INTRAMUSCULAR | Status: AC
  Administered 2016-08-23: 25 mg via INTRAVENOUS
  Filled 2016-08-23: qty 1

## 2016-08-23 MED ORDER — ACETAMINOPHEN 325 MG PO TABS
650.0000 mg | ORAL_TABLET | ORAL | Status: DC | PRN
  Administered 2016-08-23: 650 mg via ORAL
  Filled 2016-08-23: qty 2

## 2016-08-23 MED ORDER — SODIUM CHLORIDE 0.9 % IV BOLUS (SEPSIS)
1000.0000 mL | Freq: Once | INTRAVENOUS | Status: AC
  Administered 2016-08-23: 1000 mL via INTRAVENOUS

## 2016-08-23 MED ORDER — SODIUM CHLORIDE 0.9 % IV SOLN
1.0000 g | Freq: Once | INTRAVENOUS | Status: DC | PRN
  Filled 2016-08-23: qty 10

## 2016-08-23 MED ORDER — SOD CITRATE-CITRIC ACID 500-334 MG/5ML PO SOLN: 30.0000 mL | ORAL | Status: DC | PRN

## 2016-08-23 MED ORDER — BUDESONIDE 0.25 MG/2ML IN SUSP
0.2500 mg | Freq: Two times a day (BID) | RESPIRATORY_TRACT | Status: DC
  Filled 2016-08-23 (×4): qty 2

## 2016-08-23 MED ORDER — METOCLOPRAMIDE HCL 5 MG/ML IJ SOLN
10.0000 mg | INTRAMUSCULAR | Status: DC | PRN
  Administered 2016-08-23 – 2016-08-24 (×3): 10 mg via INTRAVENOUS
  Filled 2016-08-23 (×3): qty 2

## 2016-08-23 MED ORDER — BECLOMETHASONE DIPROPIONATE 40 MCG/ACT IN AERS: 2.0000 | INHALATION_SPRAY | Freq: Two times a day (BID) | RESPIRATORY_TRACT | Status: DC

## 2016-08-23 MED ORDER — LABETALOL HCL 200 MG PO TABS: 200.0000 mg | ORAL_TABLET | Freq: Two times a day (BID) | ORAL | Status: DC

## 2016-08-23 MED ORDER — HYDRALAZINE HCL 20 MG/ML IJ SOLN
5.0000 mg | Freq: Once | INTRAMUSCULAR | Status: AC | PRN
  Administered 2016-08-23: 5 mg via INTRAVENOUS
  Filled 2016-08-23: qty 1

## 2016-08-23 MED ORDER — OXYCODONE HCL 5 MG PO TABS
5.0000 mg | ORAL_TABLET | ORAL | Status: DC | PRN
  Administered 2016-08-23: 5 mg via ORAL
  Filled 2016-08-23: qty 1

## 2016-08-23 MED ORDER — IBUPROFEN 800 MG PO TABS
800.0000 mg | ORAL_TABLET | Freq: Four times a day (QID) | ORAL | Status: DC | PRN
  Administered 2016-08-23: 800 mg via ORAL
  Filled 2016-08-23: qty 1

## 2016-08-23 MED ORDER — LACTATED RINGERS IV SOLN
INTRAVENOUS | Status: DC
  Administered 2016-08-23 (×2): via INTRAVENOUS
  Administered 2016-08-23: 100 mL/h via INTRAVENOUS

## 2016-08-23 MED ORDER — SODIUM CHLORIDE FLUSH 0.9 % IV SOLN
INTRAVENOUS | Status: AC
  Administered 2016-08-23: 10 mL
  Filled 2016-08-23: qty 10

## 2016-08-23 MED ORDER — METHYLDOPA 250 MG PO TABS
250.0000 mg | ORAL_TABLET | Freq: Two times a day (BID) | ORAL | Status: DC
  Administered 2016-08-23 – 2016-08-24 (×3): 250 mg via ORAL
  Filled 2016-08-23 (×3): qty 1

## 2016-08-23 MED ORDER — DIPHENHYDRAMINE HCL 50 MG/ML IJ SOLN
25.0000 mg | INTRAMUSCULAR | Status: DC | PRN
  Administered 2016-08-23 – 2016-08-24 (×3): 25 mg via INTRAVENOUS
  Filled 2016-08-23 (×3): qty 1

## 2016-08-23 MED ORDER — MAGNESIUM SULFATE 50 % IJ SOLN
2.0000 g/h | INTRAVENOUS | Status: DC
  Administered 2016-08-23: 2 g/h via INTRAVENOUS
  Filled 2016-08-23: qty 80

## 2016-08-23 MED ORDER — ONDANSETRON HCL 4 MG/2ML IJ SOLN: 4.0000 mg | Freq: Four times a day (QID) | INTRAMUSCULAR | Status: DC | PRN

## 2016-08-23 MED ORDER — OXYCODONE-ACETAMINOPHEN 5-325 MG PO TABS
1.0000 | ORAL_TABLET | ORAL | Status: DC | PRN
  Administered 2016-08-23: 2 via ORAL
  Filled 2016-08-23: qty 2

## 2016-08-23 MED ORDER — HYDRALAZINE HCL 20 MG/ML IJ SOLN: 10.0000 mg | INTRAMUSCULAR | Status: DC | PRN

## 2016-08-23 NOTE — ED Notes (Signed)
MD Webster at bedside 

## 2016-08-23 NOTE — H&P (Signed)
Obstetrics & Gynecology H&P   Chief Complain: Headache, elevated blood pressure  History of Present Illness: Patient is a 20 y.o. [id number] s/p uncomplicated TSVD on 84/66/5993 who awoke yesterday evening with a severe temporal headache that had her in tears (10/10).  Her aunt to her blood pressure on three occasions and obtained elevated readings as high as 206/109.  She has not had any vision changes.  No blood pressure concerns antepartum or at the time of discharge 2 days postpartum.  Patient received loading dose of magnesium in ER and had improvement in headaches and blood pressure.  CT head without abnormalities.  Review of Systems:10 point review of systems  Past Medical History:  Past Medical History:  Diagnosis Date  . Anemia during pregnancy   . 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 Asthma    well controlled, last episode in high school with running track  . Depression    PP depression, on zoloft presently  . Postpartum depression     Past Surgical History:  Past Surgical History:  Procedure Laterality Date  . NO PAST SURGERIES    . WISDOM TOOTH EXTRACTION Bilateral 2013   states she was put to sleep    Family History:  Family History  Problem Relation Age of Onset  . Cancer Paternal Aunt     Social History:  Social History   Social History  . Marital status: Married    Spouse name: N/A  . Number of children: N/A  . Years of education: N/A   Occupational History  . Not on file.   Social History Main Topics  . Smoking status: Former Smoker    Packs/day: 0.50    Years: 2.00    Types: Cigarettes    Quit date: 03/05/2016  . Smokeless tobacco: Never Used  . Alcohol use No  . Drug use: No  . Sexual activity: No   Other Topics Concern  . Not on file   Social History Narrative  . No narrative on file    Allergies:  Allergies  Allergen Reactions  . Shellfish Allergy Anaphylaxis     Medications: Prior to Admission medications   Medication Sig Start Date End Date Taking? Authorizing Provider  acetaminophen (TYLENOL) 325 MG tablet Take 325 mg by mouth every 4 (four) hours as needed. Reported on 06/23/2016    Historical Provider, MD  beclomethasone (QVAR) 40 MCG/ACT inhaler Inhale 2 puffs into the lungs 2 (two) times daily. Reported on 06/29/2016    Historical Provider, MD  Prenatal Vit-Fe Fumarate-FA (MULTIVITAMIN-PRENATAL) 27-0.8 MG TABS tablet Take 1 tablet by mouth daily at 12 noon.    Historical Provider, MD  sertraline (ZOLOFT) 25 MG tablet Take 50 mg by mouth at bedtime.     Historical Provider, MD    Physical Exam Vitals: Blood pressure 129/82, pulse 63, temperature 98.7 F (37.1 C), temperature source Oral, resp. rate 19, last menstrual period 10/17/2015, SpO2 100 %, not currently breastfeeding.  General: NAD HEENT: normocephalic, anicteric Pulmonary: no increased work of breathing Cardiovascular:  RRR Abdomen: gravid, non-tender Genitourinary: deferred Extremities: no edema Neurologic: grossly intact Psychiatric: mood appropriate, affect full  Labs: Results for orders placed or performed during the hospital encounter of 08/23/16 (from the past 72 hour(s))  CBC     Status: Abnormal   Collection Time: 08/23/16  2:00 AM  Result Value Ref Range   WBC 7.3 3.6 - 11.0 K/uL   RBC 4.81 3.80 -  5.20 MIL/uL   Hemoglobin 12.8 12.0 - 16.0 g/dL   HCT 37.7 35.0 - 47.0 %   MCV 78.3 (L) 80.0 - 100.0 fL   MCH 26.7 26.0 - 34.0 pg   MCHC 34.1 32.0 - 36.0 g/dL   RDW 14.5 11.5 - 14.5 %   Platelets 362 150 - 440 K/uL  Urinalysis complete, with microscopic ([facility] only)     Status: Abnormal   Collection Time: 08/23/16  2:00 AM  Result Value Ref Range   Color, Urine STRAW (A) YELLOW   APPearance CLEAR (A) CLEAR   Glucose, UA NEGATIVE NEGATIVE mg/dL   Bilirubin Urine NEGATIVE NEGATIVE   Ketones, ur NEGATIVE NEGATIVE mg/dL   Specific Gravity, Urine 1.003 (L) 1.005 -  1.030   Hgb urine dipstick 3+ (A) NEGATIVE   pH 7.0 5.0 - 8.0   Protein, ur NEGATIVE NEGATIVE mg/dL   Nitrite NEGATIVE NEGATIVE   Leukocytes, UA 2+ (A) NEGATIVE   RBC / HPF 0-5 0 - 5 RBC/hpf   WBC, UA 6-30 0 - 5 WBC/hpf   Bacteria, UA RARE (A) NONE SEEN   Squamous Epithelial / LPF NONE SEEN NONE SEEN  Comprehensive metabolic panel     Status: Abnormal   Collection Time: 08/23/16  2:00 AM  Result Value Ref Range   Sodium 139 135 - 145 mmol/L   Potassium 3.8 3.5 - 5.1 mmol/L   Chloride 110 101 - 111 mmol/L   CO2 24 22 - 32 mmol/L   Glucose, Bld 85 65 - 99 mg/dL   BUN 13 6 - 20 mg/dL   Creatinine, Ser 0.50 0.44 - 1.00 mg/dL   Calcium 9.1 8.9 - 10.3 mg/dL   Total Protein 6.8 6.5 - 8.1 g/dL   Albumin 3.4 (L) 3.5 - 5.0 g/dL   AST 17 15 - 41 U/L   ALT 13 (L) 14 - 54 U/L   Alkaline Phosphatase 111 38 - 126 U/L   Total Bilirubin 0.1 (L) 0.3 - 1.2 mg/dL   GFR calc non Af Amer >60 >60 mL/min   GFR calc Af Amer >60 >60 mL/min    Comment: (NOTE) The eGFR has been calculated using the CKD EPI equation. This calculation has not been validated in all clinical situations. eGFR's persistently <60 mL/min signify possible Chronic Kidney Disease.    Anion gap 5 5 - 15    Imaging Ct Head Wo Contrast  Result Date: 08/23/2016 CLINICAL DATA:  One week postpartum. Headache and high blood pressure. EXAM: CT HEAD WITHOUT CONTRAST TECHNIQUE: Contiguous axial images were obtained from the base of the skull through the vertex without intravenous contrast. COMPARISON:  None. FINDINGS: Brain: Ventricles and sulci appear symmetrical. No ventricular dilatation. No mass effect or midline shift. No abnormal extra-axial fluid collections. Gray-white matter junctions are distinct. Basal cisterns are not effaced. No evidence of acute intracranial hemorrhage. Vascular: No hyperdense vessel or unexpected calcification. Skull: No acute depressed skull fractures. Sinuses/Orbits: No acute finding. Other: None IMPRESSION:  No acute intracranial abnormalities. Electronically Signed   By: Lucienne Capers M.D.   On: 08/23/2016 01:40    Assessment: 20 y.o. [id number] with postpartum preeclampsia with severe features by severe range blood pressure and neurologic signs  Plan: - Admit to L&D - Started on magnesium sulfate by ER, will continue.  Given quick response she may be candidate for truncated 12-hrs course - Labs and imaging without abnormalities

## 2016-08-23 NOTE — Progress Notes (Signed)
Switched to methyldopa 250mg  po bid because of low heart rate as opposed to labetalol

## 2016-08-23 NOTE — ED Notes (Signed)
Pt 1 week post partum. Pt began to have headache/migraine at approx 2330 on 9/1. Pt took BP 3 times on automatic BP cuff and was hypertensive all 3 times. Pt's pressure max was 206/109.

## 2016-08-23 NOTE — ED Provider Notes (Signed)
[facility] Emergency Department Provider Note   ____________________________________________   First MD Initiated Contact with Patient 08/23/16 [phone]     (approximate)  I have reviewed the triage vital signs and the nursing notes.   HISTORY  Chief Complaint Migraine and Hypertension    HPI Nancy Ball is a 20 y.o. female who comes into the hospital today with a migraine. She reports it started around 2330. She called her on the one on came over her blood pressure was checked. They checked it 3 times and it was high. The patient's highest blood pressure was 206/109. Patient is one week postpartum. She reports that prior to delivery she had some mildly elevated blood pressures but they were not very bad. The patient gave birth on 824. Her OB/GYN is [facility]. The patient reports that she has a 10 out of 10 headache that in the back of her head. She does not have a history of migraines and she has not taken anything for her headache. She has had some back pain but reports that she did take some ibuprofen for it earlier. The patient is not breast-feeding. She is here for her elevated blood pressure as well as her headache. The patient has not had any blurry vision, nausea, vomiting, neck pain, shortness of breath, dizziness or lightheadedness.   Past Medical History:  Diagnosis Date  . Anemia during pregnancy   . 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 Asthma    well controlled, last episode in high school with running track  . Depression    PP depression, on zoloft presently  . Postpartum depression     Patient Active Problem List   Diagnosis Date Noted  . Preeclampsia 08/23/2016  . Labor and delivery indication for care or intervention 08/14/2016  . Irregular contractions 08/10/2016  . Pregnancy 06/30/2016  . Vaginal bleeding during pregnancy, antepartum 06/23/2016  . Amniotic fluid leaking  05/11/2016    Past Surgical History:  Procedure Laterality Date  . NO PAST SURGERIES    . WISDOM TOOTH EXTRACTION Bilateral 2013   states she was put to sleep    Prior to Admission medications   Medication Sig Start Date End Date Taking? Authorizing Provider  acetaminophen (TYLENOL) 325 MG tablet Take 325 mg by mouth every 4 (four) hours as needed. Reported on 06/23/2016   Yes Historical Provider, MD  Prenatal Vit-Fe Fumarate-FA (MULTIVITAMIN-PRENATAL) 27-0.8 MG TABS tablet Take 1 tablet by mouth daily at 12 noon.   Yes Historical Provider, MD  sertraline (ZOLOFT) 50 MG tablet Take 50 mg by mouth daily.   Yes Historical Provider, MD    Allergies Shellfish allergy  Family History  Problem Relation Age of Onset  . Cancer Paternal Aunt     Social History Social History  Substance Use Topics  . Smoking status: Former Smoker    Packs/day: 0.50    Years: 2.00    Types: Cigarettes    Quit date: 03/05/2016  . Smokeless tobacco: Never Used  . Alcohol use No    Review of Systems Constitutional: No fever/chills Eyes: No visual changes. ENT: No sore throat. Cardiovascular: Denies chest pain. Respiratory: Denies shortness of breath. Gastrointestinal: No abdominal pain.  No nausea, no vomiting.  No diarrhea.  No constipation. Genitourinary: Negative for dysuria. Musculoskeletal: Negative for back pain. Skin: Negative for rash. Neurological: Headache  10-point ROS otherwise negative.  ____________________________________________   PHYSICAL EXAM:  VITAL SIGNS: ED  Triage Vitals  Enc Vitals Group     BP 08/23/16 0016 (!) 163/106     Pulse Rate 08/23/16 0016 (!) 56     Resp 08/23/16 0016 20     Temp 08/23/16 0016 98.7 F (37.1 C)     Temp Source 08/23/16 0016 Oral     SpO2 08/23/16 0016 99 %     Weight --      Height --      Head Circumference --      Peak Flow --      Pain Score 08/23/16 0014 10     Pain Loc --      Pain Edu? --      Excl. in GC? --      Constitutional: Alert and oriented. Well appearing and in moderate distress. Eyes: Conjunctivae are normal. PERRL. EOMI. Head: Atraumatic. Nose: No congestion/rhinnorhea. Mouth/Throat: Mucous membranes are moist.  Oropharynx non-erythematous. Cardiovascular: Normal rate, regular rhythm. Grossly normal heart sounds.  Good peripheral circulation. Respiratory: Normal respiratory effort.  No retractions. Lungs CTAB. Gastrointestinal: Soft and nontender. No distention. Positive bowel sounds Musculoskeletal: No lower extremity tenderness nor edema.   Neurologic:  Normal speech and language. Cranial nerves II through XII are grossly intact with no focal motor or neuro deficits Skin:  Skin is warm, dry and intact.  Psychiatric: Mood and affect are normal.   ____________________________________________   LABS (all labs ordered are listed, but only abnormal results are displayed)  Labs Reviewed  CBC - Abnormal; Notable for the following:       Result Value   MCV 78.3 (*)    All other components within normal limits  URINALYSIS COMPLETEWITH MICROSCOPIC ([facility] ONLY) - Abnormal; Notable for the following:    Color, Urine STRAW (*)    APPearance CLEAR (*)    Specific Gravity, Urine 1.003 (*)    Hgb urine dipstick 3+ (*)    Leukocytes, UA 2+ (*)    Bacteria, UA RARE (*)    All other components within normal limits  COMPREHENSIVE METABOLIC PANEL - Abnormal; Notable for the following:    Albumin 3.4 (*)    ALT 13 (*)    Total Bilirubin 0.1 (*)    All other components within normal limits   ____________________________________________  EKG  None ____________________________________________  RADIOLOGY  CT head ____________________________________________   PROCEDURES  Procedure(s) performed: None  Procedures  Critical Care performed: No  ____________________________________________   INITIAL IMPRESSION / ASSESSMENT AND PLAN / ED COURSE  Pertinent labs & imaging  results that were available during my care of the patient were reviewed by me and considered in my medical decision making (see chart for details).  This is a 20 year old female who comes into the hospital today with a headache and elevated blood pressure. The patient is one week postpartum. I will give the patient some Reglan, Benadryl and normal saline. I will also give the patient a dose of magnesium sulfate 2 g IV. I will await the results of the patient's blood work and I will contact OB/GYN to admit the patient for preeclampsia.  Clinical Course  Value Comment By Time  CT Head Wo Contrast No acute intracranial abnormalities Rebecka Apley, MD 09/02 0254   After the Reglan and Benadryl the patient's headache is improved. The patient's blood pressure is also improved. Given the elevated blood pressures previously I feel that the patient needs to be admitted to the OB/GYN service. I contacted the OB/GYN on call Dr. Bonney Aid and  he agrees to admit the patient to his service. The patient has no further complaints or concerns she will be admitted to the hospital.  ____________________________________________   FINAL CLINICAL IMPRESSION(S) / ED DIAGNOSES  Final diagnoses:  Pre-eclampsia in postpartum period      NEW MEDICATIONS STARTED DURING THIS VISIT:  New Prescriptions   No medications on file     Note:  This document was prepared using Dragon voice recognition software and may include unintentional dictation errors.    Rebecka ApleyAllison P Webster, MD 08/23/16 [phone]

## 2016-08-23 NOTE — ED Triage Notes (Signed)
Reports tonight with a migraine and when she checked her blood pressure it was elevated (204/108)

## 2016-08-23 NOTE — ED Notes (Signed)
[facility]

## 2016-08-24 ENCOUNTER — Encounter: Payer: Self-pay | Admitting: Emergency Medicine

## 2016-08-24 DIAGNOSIS — Z791 Long term (current) use of non-steroidal anti-inflammatories (NSAID): Secondary | ICD-10-CM

## 2016-08-24 DIAGNOSIS — O99355 Diseases of the nervous system complicating the puerperium: Secondary | ICD-10-CM

## 2016-08-24 DIAGNOSIS — Z87891 Personal history of nicotine dependence: Secondary | ICD-10-CM | POA: Insufficient documentation

## 2016-08-24 DIAGNOSIS — R51 Headache: Secondary | ICD-10-CM

## 2016-08-24 DIAGNOSIS — Z79899 Other long term (current) drug therapy: Secondary | ICD-10-CM | POA: Insufficient documentation

## 2016-08-24 DIAGNOSIS — J45909 Unspecified asthma, uncomplicated: Secondary | ICD-10-CM | POA: Insufficient documentation

## 2016-08-24 DIAGNOSIS — O1490 Unspecified pre-eclampsia, unspecified trimester: Secondary | ICD-10-CM

## 2016-08-24 DIAGNOSIS — I159 Secondary hypertension, unspecified: Secondary | ICD-10-CM | POA: Insufficient documentation

## 2016-08-24 LAB — COMPREHENSIVE METABOLIC PANEL
ALT: 10 U/L — AB (ref 14–54)
ANION GAP: 4 — AB (ref 5–15)
AST: 18 U/L (ref 15–41)
Albumin: 3 g/dL — ABNORMAL LOW (ref 3.5–5.0)
Alkaline Phosphatase: 108 U/L (ref 38–126)
BUN: 10 mg/dL (ref 6–20)
CHLORIDE: 110 mmol/L (ref 101–111)
CO2: 25 mmol/L (ref 22–32)
CREATININE: 0.7 mg/dL (ref 0.44–1.00)
Calcium: 6.9 mg/dL — ABNORMAL LOW (ref 8.9–10.3)
GFR calc non Af Amer: >60 mL/min (ref >60)
Glucose, Bld: 113 mg/dL — ABNORMAL HIGH (ref 65–99)
POTASSIUM: 3.8 mmol/L (ref 3.5–5.1)
SODIUM: 139 mmol/L (ref 135–145)
Total Bilirubin: <0.1 mg/dL — ABNORMAL LOW (ref 0.3–1.2)
Total Protein: 6.2 g/dL — ABNORMAL LOW (ref 6.5–8.1)

## 2016-08-24 LAB — CBC
HCT: 34.5 % — ABNORMAL LOW (ref 35.0–47.0)
Hemoglobin: 12.2 g/dL (ref 12.0–16.0)
MCH: 27.7 pg (ref 26.0–34.0)
MCHC: 35.2 g/dL (ref 32.0–36.0)
MCV: 78.6 fL — ABNORMAL LOW (ref 80.0–100.0)
PLATELETS: 352 10*3/uL (ref 150–440)
RBC: 4.39 MIL/uL (ref 3.80–5.20)
RDW: 14.8 % — AB (ref 11.5–14.5)
WBC: 7.7 10*3/uL (ref 3.6–11.0)

## 2016-08-24 LAB — MAGNESIUM: Magnesium: 7.2 mg/dL (ref 1.7–2.4)

## 2016-08-24 MED ORDER — METHYLDOPA 250 MG PO TABS: 250.0000 mg | ORAL_TABLET | Freq: Two times a day (BID) | ORAL | 6 refills | Status: DC

## 2016-08-24 MED ORDER — SODIUM CHLORIDE 0.9 % IV BOLUS (SEPSIS)
1000.0000 mL | Freq: Once | INTRAVENOUS | Status: AC
  Administered 2016-08-24: 1000 mL via INTRAVENOUS

## 2016-08-24 MED ORDER — METOCLOPRAMIDE HCL 5 MG/ML IJ SOLN
10.0000 mg | Freq: Once | INTRAMUSCULAR | Status: AC
  Administered 2016-08-24: 10 mg via INTRAVENOUS
  Filled 2016-08-24: qty 2

## 2016-08-24 MED ORDER — KETOROLAC TROMETHAMINE 30 MG/ML IJ SOLN
30.0000 mg | Freq: Once | INTRAMUSCULAR | Status: AC
  Administered 2016-08-24: 30 mg via INTRAVENOUS
  Filled 2016-08-24: qty 1

## 2016-08-24 MED ORDER — DIPHENHYDRAMINE HCL 50 MG/ML IJ SOLN
25.0000 mg | Freq: Once | INTRAMUSCULAR | Status: AC
  Administered 2016-08-24: 25 mg via INTRAVENOUS
  Filled 2016-08-24: qty 1

## 2016-08-24 MED ORDER — SODIUM CHLORIDE FLUSH 0.9 % IV SOLN
INTRAVENOUS | Status: AC
  Administered 2016-08-24: 10 mL
  Filled 2016-08-24: qty 20

## 2016-08-24 NOTE — ED Triage Notes (Signed)
Pt was discharged today from L & D, delivered full term baby vaginally on 08/14/2016; pt returned here this past Friday night and admitted for post partum pre-eclampsia; discharged at 11am today feeling better; pt says around 5pm tonight she started having a headache; blood pressure at home as high as 219/114; pt was discharged home with prescription for HTN medicine; her pharmacy is unable to fill until Tuesday, she didn't think to ask to have it filled elsewhere; awake and alert; talking in complete coherent sentences

## 2016-08-24 NOTE — Discharge Summary (Signed)
Gynecology Discharge Summary Date of Admission: 08/23/2016 Date of Discharge: 08/24/2016  The patient presented to ER on day 10 postpartum with severe headache and severe range BPs at home. PC Ratio 0.4, BPs in severe range. Pt was given 24 hours of Magnesium Sulfate for seizure ppx and BP treated with Methyldopa. Pt's headaches treated with benadryl and reglan. Pt reported headache finally resolved after being able to eat after magnesium d/c. At time of this note, BP in normal range and pt denies headache, visual changes or any other complaints. Will be d/c home with Rx for Methyldopa and pt to f/u at office this week for BP check.   Follow-up Information    [facility] [place] Schedule an appointment as soon as possible for a visit today.   Why:  BP check this week Contact information: [address] [place] [address] [phone]               Medication List    TAKE these medications   acetaminophen 325 MG tablet Commonly known as:  TYLENOL Take 325 mg by mouth every 4 (four) hours as needed. Reported on 06/23/2016   ibuprofen 600 MG tablet Commonly known as:  ADVIL,MOTRIN Take 600 mg by mouth every 6 (six) hours as needed.   methyldopa 250 MG tablet Commonly known as:  ALDOMET Take 1 tablet (250 mg total) by mouth 2 (two) times daily.   multivitamin-prenatal 27-0.8 MG Tabs tablet Take 1 tablet by mouth daily at 12 noon.   sertraline 50 MG tablet Commonly known as:  ZOLOFT Take 50 mg by mouth daily.     Marta AntuBrothers, Nader Boys, [place]

## 2016-08-24 NOTE — Discharge Instructions (Signed)
Discharge instructions:  ° °Call office if you have any of the following: headache, visual changes, fever >100 F, chills, breast concerns, excessive vaginal bleeding, incision drainage or problems, leg pain or redness, depression or any other concerns.  ° °Activity: Do not lift > 10 lbs for 6 weeks.  °No intercourse or tampons for 6 weeks.  °No driving for 1-2 weeks.  ° °

## 2016-08-25 ENCOUNTER — Emergency Department
Admission: EM | Admit: 2016-08-25 | Discharge: 2016-08-25 | Disposition: A | Discharge status: Home / Self Care | Payer: Medicaid Other | Source: Home / Self Care | Attending: Emergency Medicine | Admitting: Emergency Medicine

## 2016-08-25 ENCOUNTER — Encounter: Payer: Self-pay | Admitting: Emergency Medicine

## 2016-08-25 ENCOUNTER — Inpatient Hospital Stay
Admission: EM | Admit: 2016-08-25 | Discharge: 2016-08-27 | DRG: 776 | Disposition: A | Discharge status: Home / Self Care | Payer: Medicaid Other | Attending: Obstetrics and Gynecology | Admitting: Obstetrics and Gynecology

## 2016-08-25 DIAGNOSIS — Z9889 Other specified postprocedural states: Secondary | ICD-10-CM

## 2016-08-25 DIAGNOSIS — O1495 Unspecified pre-eclampsia, complicating the puerperium: Secondary | ICD-10-CM | POA: Diagnosis present

## 2016-08-25 DIAGNOSIS — Z87891 Personal history of nicotine dependence: Secondary | ICD-10-CM | POA: Diagnosis not present

## 2016-08-25 DIAGNOSIS — Z888 Allergy status to other drugs, medicaments and biological substances status: Secondary | ICD-10-CM

## 2016-08-25 DIAGNOSIS — O1415 Severe pre-eclampsia, complicating the puerperium: Principal | ICD-10-CM | POA: Diagnosis present

## 2016-08-25 DIAGNOSIS — Z809 Family history of malignant neoplasm, unspecified: Secondary | ICD-10-CM | POA: Diagnosis not present

## 2016-08-25 DIAGNOSIS — O1490 Unspecified pre-eclampsia, unspecified trimester: Secondary | ICD-10-CM | POA: Diagnosis not present

## 2016-08-25 DIAGNOSIS — Z79899 Other long term (current) drug therapy: Secondary | ICD-10-CM | POA: Diagnosis not present

## 2016-08-25 DIAGNOSIS — I159 Secondary hypertension, unspecified: Secondary | ICD-10-CM

## 2016-08-25 LAB — CBC
HEMATOCRIT: 39.1 % (ref 35.0–47.0)
HEMOGLOBIN: 13.1 g/dL (ref 12.0–16.0)
MCH: 26.7 pg (ref 26.0–34.0)
MCHC: 33.5 g/dL (ref 32.0–36.0)
MCV: 79.7 fL — AB (ref 80.0–100.0)
Platelets: 349 10*3/uL (ref 150–440)
RBC: 4.91 MIL/uL (ref 3.80–5.20)
RDW: 14.9 % — ABNORMAL HIGH (ref 11.5–14.5)
WBC: 8.1 10*3/uL (ref 3.6–11.0)

## 2016-08-25 LAB — COMPREHENSIVE METABOLIC PANEL
ALT: 13 U/L — AB (ref 14–54)
AST: 23 U/L (ref 15–41)
Albumin: 3.6 g/dL (ref 3.5–5.0)
Alkaline Phosphatase: 115 U/L (ref 38–126)
Anion gap: 7 (ref 5–15)
BUN: 19 mg/dL (ref 6–20)
CALCIUM: 9.1 mg/dL (ref 8.9–10.3)
CO2: 21 mmol/L — ABNORMAL LOW (ref 22–32)
CREATININE: 0.7 mg/dL (ref 0.44–1.00)
Chloride: 110 mmol/L (ref 101–111)
Glucose, Bld: 96 mg/dL (ref 65–99)
Potassium: 4.2 mmol/L (ref 3.5–5.1)
Sodium: 138 mmol/L (ref 135–145)
Total Protein: 7.1 g/dL (ref 6.5–8.1)

## 2016-08-25 LAB — MAGNESIUM: MAGNESIUM: 2.2 mg/dL (ref 1.7–2.4)

## 2016-08-25 MED ORDER — METHYLDOPA 250 MG PO TABS
500.0000 mg | ORAL_TABLET | Freq: Three times a day (TID) | ORAL | Status: DC
  Administered 2016-08-26 (×2): 500 mg via ORAL
  Filled 2016-08-25 (×3): qty 2

## 2016-08-25 MED ORDER — SODIUM CHLORIDE 0.9 % IV BOLUS (SEPSIS)
1000.0000 mL | Freq: Once | INTRAVENOUS | Status: AC
  Administered 2016-08-25: 1000 mL via INTRAVENOUS

## 2016-08-25 MED ORDER — DIPHENHYDRAMINE HCL 25 MG PO CAPS
ORAL_CAPSULE | ORAL | Status: AC
  Administered 2016-08-25: 25 mg via ORAL
  Filled 2016-08-25: qty 1

## 2016-08-25 MED ORDER — SERTRALINE HCL 50 MG PO TABS
50.0000 mg | ORAL_TABLET | Freq: Every day | ORAL | Status: DC
  Administered 2016-08-26 – 2016-08-27 (×2): 50 mg via ORAL
  Filled 2016-08-25 (×2): qty 1

## 2016-08-25 MED ORDER — DIPHENHYDRAMINE HCL 25 MG PO CAPS
25.0000 mg | ORAL_CAPSULE | Freq: Once | ORAL | Status: AC
  Administered 2016-08-25: 25 mg via ORAL

## 2016-08-25 MED ORDER — METHYLDOPA 250 MG PO TABS
250.0000 mg | ORAL_TABLET | Freq: Two times a day (BID) | ORAL | Status: DC
  Filled 2016-08-25 (×2): qty 2

## 2016-08-25 MED ORDER — HYDRALAZINE HCL 20 MG/ML IJ SOLN
5.0000 mg | Freq: Once | INTRAMUSCULAR | Status: AC | PRN
  Administered 2016-08-25: 5 mg via INTRAVENOUS

## 2016-08-25 MED ORDER — METHYLDOPA 250 MG PO TABS
250.0000 mg | ORAL_TABLET | ORAL | Status: AC
  Administered 2016-08-25: 250 mg via ORAL
  Filled 2016-08-25: qty 1

## 2016-08-25 MED ORDER — HYDRALAZINE HCL 20 MG/ML IJ SOLN
10.0000 mg | Freq: Once | INTRAMUSCULAR | Status: AC
Start: 2016-08-25 — End: 2016-08-25
  Administered 2016-08-25: 10 mg via INTRAVENOUS
  Filled 2016-08-25: qty 1

## 2016-08-25 MED ORDER — PROCHLORPERAZINE EDISYLATE 5 MG/ML IJ SOLN
10.0000 mg | Freq: Once | INTRAMUSCULAR | Status: AC
Start: 2016-08-25 — End: 2016-08-25
  Administered 2016-08-25: 10 mg via INTRAVENOUS

## 2016-08-25 MED ORDER — MAGNESIUM SULFATE 2 GM/50ML IV SOLN
2.0000 g | Freq: Once | INTRAVENOUS | Status: DC
Start: 2016-08-25 — End: 2016-08-25

## 2016-08-25 MED ORDER — PROCHLORPERAZINE EDISYLATE 5 MG/ML IJ SOLN
INTRAMUSCULAR | Status: AC
  Administered 2016-08-25: 10 mg via INTRAVENOUS
  Filled 2016-08-25: qty 2

## 2016-08-25 MED ORDER — METHYLDOPA 250 MG PO TABS
250.0000 mg | ORAL_TABLET | Freq: Once | ORAL | Status: AC
  Administered 2016-08-25: 250 mg via ORAL
  Filled 2016-08-25: qty 1

## 2016-08-25 MED ORDER — HYDRALAZINE HCL 20 MG/ML IJ SOLN
5.0000 mg | Freq: Once | INTRAMUSCULAR | Status: DC
  Filled 2016-08-25: qty 1

## 2016-08-25 NOTE — ED Triage Notes (Signed)
Pt presents to ED with child and family c/o postpartum HTN. Last BP reading around 8pm 180/101. Pt states symptoms of "really bad migraine".

## 2016-08-25 NOTE — ED Provider Notes (Signed)
[facility] Emergency Department Provider Note  ____________________________________________   First MD Initiated Contact with Patient 08/25/16 2156     (approximate)  I have reviewed the triage vital signs and the nursing notes.   HISTORY  Chief Complaint Hypertension (post partum preeclampsia)  HPI Nancy Ball is a 20 y.o. female recent delivery. Recent admission for preeclampsia treated with magnesium  Patient reports her headache is come back, blood pressure is again elevated and she is having "preeclampsia". He describes a bifrontal throbbing headache, no associated visual changes, no nausea or vomiting, but does report a persistent throbbing bilateral headache. She reports is similar in intensity and the same as when she was admitted the hospital then discharged just 2 days ago  She has been taking prescribed medication as directed by gynecology team   Past Medical History:  Diagnosis Date  . Anemia during pregnancy   . 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 Asthma    well controlled, last episode in high school with running track  . Depression    PP depression, on zoloft presently  . Postpartum depression     Patient Active Problem List   Diagnosis Date Noted  . Preeclampsia in postpartum period 08/25/2016  . Preeclampsia 08/23/2016  . Labor and delivery indication for care or intervention 08/14/2016  . Irregular contractions 08/10/2016  . Pregnancy 06/30/2016  . Vaginal bleeding during pregnancy, antepartum 06/23/2016  . Amniotic fluid leaking 05/11/2016    Past Surgical History:  Procedure Laterality Date  . NO PAST SURGERIES    . WISDOM TOOTH EXTRACTION Bilateral 2013   states she was put to sleep    Prior to Admission medications   Medication Sig Start Date End Date Taking? Authorizing Provider  acetaminophen (TYLENOL) 325 MG tablet Take 325 mg by mouth every 4 (four)  hours as needed. Reported on 06/23/2016   Yes Historical Provider, MD  ibuprofen (ADVIL,MOTRIN) 600 MG tablet Take 600 mg by mouth every 6 (six) hours as needed.   Yes Historical Provider, MD  methyldopa (ALDOMET) 250 MG tablet Take 1 tablet (250 mg total) by mouth 2 (two) times daily. 08/24/16  Yes Marta Antu, CNM  Prenatal Vit-Fe Fumarate-FA (MULTIVITAMIN-PRENATAL) 27-0.8 MG TABS tablet Take 1 tablet by mouth daily at 12 noon.   Yes Historical Provider, MD  sertraline (ZOLOFT) 50 MG tablet Take 50 mg by mouth daily.   Yes Historical Provider, MD    Allergies Shellfish allergy and Excedrin migraine [aspirin-acetaminophen-caffeine]  Family History  Problem Relation Age of Onset  . Cancer Paternal Aunt     Social History Social History  Substance Use Topics  . Smoking status: Former Smoker    Packs/day: 0.50    Years: 2.00    Types: Cigarettes    Quit date: 03/05/2016  . Smokeless tobacco: Never Used  . Alcohol use No    Review of Systems Constitutional: No fever/chills Eyes: No visual changes. ENT: No sore throat. Cardiovascular: Denies chest pain. Respiratory: Denies shortness of breath. Gastrointestinal: No abdominal pain.  No nausea, no vomiting.  No diarrhea.  No constipation. Genitourinary: Negative for dysuria. Musculoskeletal: Negative for back pain. Skin: Negative for rash. Neurological: Negative for focal weakness or numbness.  10-point ROS otherwise negative.  ____________________________________________   PHYSICAL EXAM:  VITAL SIGNS: ED Triage Vitals  Enc Vitals Group     BP 08/25/16 2113 (!) 138/101     Pulse Rate 08/25/16 2113 67  Resp 08/25/16 2113 16     Temp 08/25/16 2113 98.1 F (36.7 C)     Temp Source 08/25/16 2113 Oral     SpO2 08/25/16 2113 100 %     Weight 08/25/16 2111 125 lb (56.7 kg)     Height 08/25/16 2111 4\' 11"  (1.499 m)     Head Circumference --      Peak Flow --      Pain Score 08/25/16 2115 8     Pain Loc --      Pain  Edu? --      Excl. in GC? --     Constitutional: Alert and oriented. Well appearing and in no acute distress. Eyes: Conjunctivae are normal. PERRL. EOMI. Head: Atraumatic. Nose: No congestion/rhinnorhea. Mouth/Throat: Mucous membranes are moist.  Oropharynx non-erythematous. Neck: No stridor.   Cardiovascular: Normal rate, regular rhythm. Grossly normal heart sounds.  Good peripheral circulation. Respiratory: Normal respiratory effort.  No retractions. Lungs CTAB. Gastrointestinal: Soft and nontender.  Musculoskeletal: No lower extremity tenderness nor edema.  No joint effusions. No pronator drift. Moves all extremities with normal strength. No cranial deficits. The patient does exhibit brisk bilateral patellar reflexes. No clonus. No tremors or seizure-like activity. Neurologic:  Normal speech and language. No gross focal neurologic deficits are appreciated. Skin:  Skin is warm, dry and intact. No rash noted. Psychiatric: Mood and affect are normal. Speech and behavior are normal.  ____________________________________________   LABS (all labs ordered are listed, but only abnormal results are displayed)  Labs Reviewed  CBC - Abnormal; Notable for the following:       Result Value   MCV 79.7 (*)    RDW 14.9 (*)    All other components within normal limits  COMPREHENSIVE METABOLIC PANEL - Abnormal; Notable for the following:    CO2 21 (*)    ALT 13 (*)    Total Bilirubin <0.1 (*)    All other components within normal limits  MAGNESIUM   ____________________________________________  EKG   ____________________________________________  RADIOLOGY   ____________________________________________   PROCEDURES  Procedure(s) performed: None  Procedures  Critical Care performed: No  ____________________________________________   INITIAL IMPRESSION / ASSESSMENT AND PLAN / ED COURSE  Pertinent labs & imaging results that were available during my care of the patient were  reviewed by me and considered in my medical decision making (see chart for details).  Case and care discussed with Dr. Chauncey CruelStabler who advises against magnesium as she has recently finished magnesium treatment, he instead recommends giving a second dose of methyldopa, if headache resolves and blood pressure improves the patient can immediate discharge. If headache recurs, persists or blood pressure does not improve within about 30 minutes he advises admission and hydralazine.  After receiving methyldopa, Reglan, and Benadryl the patient's headache improved briefly but then recurred again and blood pressure had increased to 170 once again. Given hydralazine with improvement in headache and good effect. Discussed with Dr. Chauncey CruelStabler who wishes to admit the patient for ongoing blood pressure management. Patient agreeable.   Clinical Course     ____________________________________________   FINAL CLINICAL IMPRESSION(S) / ED DIAGNOSES  Final diagnoses:  Secondary hypertension, unspecified  Preeclampsia, unspecified trimester      NEW MEDICATIONS STARTED DURING THIS VISIT:  New Prescriptions   No medications on file     Note:  This document was prepared using Dragon voice recognition software and may include unintentional dictation errors.     Sharyn CreamerMark Shaleena Crusoe, MD 08/26/16 [phone]

## 2016-08-25 NOTE — ED Provider Notes (Signed)
[facility] Emergency Department Provider Note   ____________________________________________   First MD Initiated Contact with Patient 08/25/16 0109     (approximate)  I have reviewed the triage vital signs and the nursing notes.   HISTORY  Chief Complaint Hypertension and Headache    HPI Nancy Ball is a 20 y.o. female who presents to the ED from home with a chief complaint of elevated blood pressure. Patient is 10 days status post NSVD; not breast-feeding. She was recently admitted and discharged yesterday for postpartum preeclampsia. She did well on methyldopa while in the hospital. Her family took the prescription of methyldopa 250 mg twice a day to her pharmacy who did not have it in stock. Family was told they would be in stock on 9/5. Patient began experiencing a mild, global headache approximately 5 PM. Took her blood pressure at home which was high as 219/114.Denies associated fever, chills, vision changes, neck pain, chest pain, shortness of breath, abdominal pain, nausea, vomiting, diarrhea. Denies recent travel trauma. Nothing makes her symptoms better or worse.   Past Medical History:  Diagnosis Date  . Anemia during pregnancy   . 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 Asthma    well controlled, last episode in high school with running track  . Depression    PP depression, on zoloft presently  . Postpartum depression     Patient Active Problem List   Diagnosis Date Noted  . Preeclampsia 08/23/2016  . Labor and delivery indication for care or intervention 08/14/2016  . Irregular contractions 08/10/2016  . Pregnancy 06/30/2016  . Vaginal bleeding during pregnancy, antepartum 06/23/2016  . Amniotic fluid leaking 05/11/2016    Past Surgical History:  Procedure Laterality Date  . NO PAST SURGERIES    . WISDOM TOOTH EXTRACTION Bilateral 2013   states she was put to sleep     Prior to Admission medications   Medication Sig Start Date End Date Taking? Authorizing Provider  acetaminophen (TYLENOL) 325 MG tablet Take 325 mg by mouth every 4 (four) hours as needed. Reported on 06/23/2016    Historical Provider, MD  ibuprofen (ADVIL,MOTRIN) 600 MG tablet Take 600 mg by mouth every 6 (six) hours as needed.    Historical Provider, MD  methyldopa (ALDOMET) 250 MG tablet Take 1 tablet (250 mg total) by mouth 2 (two) times daily. 08/24/16   Marta Antuamara Brothers, CNM  Prenatal Vit-Fe Fumarate-FA (MULTIVITAMIN-PRENATAL) 27-0.8 MG TABS tablet Take 1 tablet by mouth daily at 12 noon.    Historical Provider, MD  sertraline (ZOLOFT) 50 MG tablet Take 50 mg by mouth daily.    Historical Provider, MD    Allergies Shellfish allergy  Family History  Problem Relation Age of Onset  . Cancer Paternal Aunt     Social History Social History  Substance Use Topics  . Smoking status: Former Smoker    Packs/day: 0.50    Years: 2.00    Types: Cigarettes    Quit date: 03/05/2016  . Smokeless tobacco: Never Used  . Alcohol use No    Review of Systems  Constitutional: No fever/chills. Eyes: No visual changes. ENT: No sore throat. Cardiovascular: Denies chest pain. Respiratory: Denies shortness of breath. Gastrointestinal: No abdominal pain.  No nausea, no vomiting.  No diarrhea.  No constipation. Genitourinary: Negative for dysuria. Musculoskeletal: Negative for back pain. Skin: Negative for rash. Neurological: Positive for headache. Negative for focal weakness or numbness.  10-point ROS  otherwise negative.  ____________________________________________   PHYSICAL EXAM:  VITAL SIGNS: ED Triage Vitals  Enc Vitals Group     BP 08/24/16 2310 (!) 170/102     Pulse Rate 08/24/16 2310 69     Resp --      Temp 08/24/16 2310 98.7 F (37.1 C)     Temp Source 08/24/16 2310 Oral     SpO2 08/24/16 2310 99 %     Weight 08/24/16 2311 130 lb (59 kg)     Height 08/24/16 2311 4\' 11"   (1.499 m)     Head Circumference --      Peak Flow --      Pain Score 08/24/16 2319 10     Pain Loc --      Pain Edu? --      Excl. in GC? --     Constitutional: Alert and oriented. Well appearing and in no acute distress. Eyes: Conjunctivae are normal. PERRL. EOMI. Head: Atraumatic. Nose: No congestion/rhinnorhea. Mouth/Throat: Mucous membranes are moist.  Oropharynx non-erythematous. Neck: No stridor.  Supple neck without meningismus. No carotid bruits. Cardiovascular: Normal rate, regular rhythm. Grossly normal heart sounds.  Good peripheral circulation. Respiratory: Normal respiratory effort.  No retractions. Lungs CTAB. Gastrointestinal: Soft and nontender. No distention. No abdominal bruits. No CVA tenderness. Musculoskeletal: No lower extremity tenderness nor edema.  No joint effusions. Neurologic:  Normal speech and language. No gross focal neurologic deficits are appreciated. No gait instability. Skin:  Skin is warm, dry and intact. No rash noted. Psychiatric: Mood and affect are normal. Speech and behavior are normal.  ____________________________________________   LABS (all labs ordered are listed, but only abnormal results are displayed)  Labs Reviewed - No data to display ____________________________________________  EKG  None ____________________________________________  RADIOLOGY  None ____________________________________________   PROCEDURES  Procedure(s) performed: None  Procedures  Critical Care performed: No  ____________________________________________   INITIAL IMPRESSION / ASSESSMENT AND PLAN / ED COURSE  Pertinent labs & imaging results that were available during my care of the patient were reviewed by me and considered in my medical decision making (see chart for details).  20 year old female 10 days postpartum who presents to the ED with elevated blood pressure. This is in the setting of recent hospitalization and discharge for  postpartum preeclampsia, doing well on methyldopa which was not in stock in her pharmacy. Patient was awaken from sleep for exam. At this time she is voicing no complaints of headache. Also denies chest pain, shortness of breath, vision changes, abdominal pain, nausea, vomiting, diarrhea. As patient was recently in the hospital and currently asymptomatic, do not feel necessary to repeat lab work. Blood pressure is improving on its own. Will administer methyldopa 250 mg PO now. Nursing supervisor authorized patient to be dispense 2 additional tablets to cover her for 9/4 and her pharmacy should have her prescription the following day.  Clinical Course  Comment By Time  Remains hypertensive after PO methyldopa. Will administer IV hydralazine. Patient is resting comfortably, voicing no complaints.Irean Hong, MD 09/04 0339  BP 127/70 pulse rate 96 tending to crying infant. Patient voices no complaints of headache or other symptoms. 2 doses of methadone provided to patient until her pharmacy can fill her prescription tomorrow. Strict return precautions given. Patient and family members verbalize understanding and agree with plan of care. Irean Hong, MD 09/04 0411  Patient's vital signs at discharge were BP 127/70, pulse rate 85, respirations 19, room air saturations 99%. Irean Hong,  MD 09/04 0636     ____________________________________________   FINAL CLINICAL IMPRESSION(S) / ED DIAGNOSES  Final diagnoses:  Secondary hypertension, unspecified  Preeclampsia in postpartum period      NEW MEDICATIONS STARTED DURING THIS VISIT:  Discharge Medication List as of 08/25/2016  4:15 AM       Note:  This document was prepared using Dragon voice recognition software and may include unintentional dictation errors.    Irean Hong, MD 08/25/16 [phone]

## 2016-08-25 NOTE — ED Notes (Signed)
Pt comes in c/o hypertension with associated dizziness (unbalanced feeling) and migraine. Pt reports that she has been having postpartum HTN. Pt was discharged from the hospital yesterday after an uncomplicated pregnancy and birth. Pt was discharged with a prescription for 250mg  of methyldopa to be taken BID, last taken this morning at 10AM. Pt reports that she has been trying to take motrin and tylenol for headache without relief. Last took motrin at 1900 this evening. Pt is not breastfeeding. Pt denies any chest pain, SOB, N/V. Pt reports sensitivity to light with the migraine.

## 2016-08-25 NOTE — Discharge Instructions (Signed)
1. You have been provided with 2 tablets of methyldopa 250 mg. Take this twice daily today. Please notify your OB/GYN if your pharmacy does not have your medicine tomorrow. 2. Return to the ER for worsening symptoms, persistent vomiting, difficulty breathing or other concerns.

## 2016-08-26 MED ORDER — DEXTROSE-NACL 5-0.45 % IV SOLN
INTRAVENOUS | Status: DC
  Administered 2016-08-26 – 2016-08-27 (×4): via INTRAVENOUS

## 2016-08-26 MED ORDER — PRENATAL MULTIVITAMIN CH
1.0000 | ORAL_TABLET | Freq: Every day | ORAL | Status: DC
  Administered 2016-08-26 – 2016-08-27 (×2): 1 via ORAL
  Filled 2016-08-26 (×2): qty 1

## 2016-08-26 MED ORDER — METHYLDOPA 250 MG PO TABS
250.0000 mg | ORAL_TABLET | Freq: Three times a day (TID) | ORAL | Status: DC
  Administered 2016-08-26 – 2016-08-27 (×2): 250 mg via ORAL
  Filled 2016-08-26 (×2): qty 1

## 2016-08-26 MED ORDER — SENNOSIDES-DOCUSATE SODIUM 8.6-50 MG PO TABS
2.0000 | ORAL_TABLET | ORAL | Status: DC
  Administered 2016-08-26: 2 via ORAL
  Filled 2016-08-26 (×2): qty 2

## 2016-08-26 MED ORDER — ONDANSETRON HCL 4 MG PO TABS: 4.0000 mg | ORAL_TABLET | ORAL | Status: DC | PRN

## 2016-08-26 MED ORDER — OXYCODONE-ACETAMINOPHEN 5-325 MG PO TABS: 1.0000 | ORAL_TABLET | ORAL | Status: DC | PRN

## 2016-08-26 MED ORDER — WITCH HAZEL-GLYCERIN EX PADS: 1.0000 "application " | MEDICATED_PAD | CUTANEOUS | Status: DC | PRN

## 2016-08-26 MED ORDER — DIBUCAINE 1 % RE OINT: 1.0000 "application " | TOPICAL_OINTMENT | RECTAL | Status: DC | PRN

## 2016-08-26 MED ORDER — ONDANSETRON HCL 4 MG/2ML IJ SOLN: 4.0000 mg | INTRAMUSCULAR | Status: DC | PRN

## 2016-08-26 MED ORDER — ACETAMINOPHEN 325 MG PO TABS: 650.0000 mg | ORAL_TABLET | ORAL | Status: DC | PRN

## 2016-08-26 MED ORDER — DIPHENHYDRAMINE HCL 25 MG PO CAPS: 25.0000 mg | ORAL_CAPSULE | Freq: Four times a day (QID) | ORAL | Status: DC | PRN

## 2016-08-26 MED ORDER — SIMETHICONE 80 MG PO CHEW: 80.0000 mg | CHEWABLE_TABLET | ORAL | Status: DC | PRN

## 2016-08-26 MED ORDER — COCONUT OIL OIL: 1.0000 "application " | TOPICAL_OIL | Status: DC | PRN

## 2016-08-26 MED ORDER — OXYCODONE-ACETAMINOPHEN 5-325 MG PO TABS
ORAL_TABLET | ORAL | Status: AC
  Filled 2016-08-26: qty 2

## 2016-08-26 MED ORDER — OXYCODONE-ACETAMINOPHEN 5-325 MG PO TABS
2.0000 | ORAL_TABLET | ORAL | Status: DC | PRN
  Administered 2016-08-26 – 2016-08-27 (×7): 2 via ORAL
  Filled 2016-08-26 (×6): qty 2

## 2016-08-26 MED ORDER — BENZOCAINE-MENTHOL 20-0.5 % EX AERO: 1.0000 "application " | INHALATION_SPRAY | CUTANEOUS | Status: DC | PRN

## 2016-08-26 NOTE — ED Notes (Signed)
Report given to floor RN. Pt taken to floor via stretcher. Vital signs stable prior to transport.  

## 2016-08-26 NOTE — H&P (Signed)
Obstetrics & Gynecology H&P   Chief Complain: Headache, elevated blood pressure  History of Present Illness: Patient is a 20 y.o. [id number] s/p uncomplicated TSVD on 08/14/2016 admitted 08/23/2016 by myself for postpartum preeclampsia with severe features by headaches, normal labs.  She completed a 24-hr course of magnesium sulfate and was discharged on 08/24/2016 with normotensive to mild range BP on 250mg  of methyldopa bid.  She represents today for resumption of headache and elevated home BP reading.  On presentation to the ER once again noted to have severe range BP's and given an additional 250mg  of methyldopa on top of her evening dose.  BP did not respond to additional methyldopa and she required 5mg  of hydralzazine. CT head without abnormalities on 08/24/2016.  Review of Systems:10 point review of systems  Past Medical History:  Past Medical History:  Diagnosis Date  . Anemia during pregnancy   . 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 Asthma    well controlled, last episode in high school with running track  . Depression    PP depression, on zoloft presently  . Postpartum depression     Past Surgical History:  Past Surgical History:  Procedure Laterality Date  . NO PAST SURGERIES    . WISDOM TOOTH EXTRACTION Bilateral 2013   states she was put to sleep    Family History:  Family History  Problem Relation Age of Onset  . Cancer Paternal Aunt     Social History:  Social History   Social History  . Marital status: Married    Spouse name: N/A  . Number of children: N/A  . Years of education: N/A   Occupational History  . Not on file.   Social History Main Topics  . Smoking status: Former Smoker    Packs/day: 0.50    Years: 2.00    Types: Cigarettes    Quit date: 03/05/2016  . Smokeless tobacco: Never Used  . Alcohol use No  . Drug use: No  . Sexual activity: No   Other Topics Concern  . Not on file    Social History Narrative  . No narrative on file    Allergies:  Allergies  Allergen Reactions  . Shellfish Allergy Anaphylaxis  . Excedrin Migraine [Aspirin-Acetaminophen-Caffeine] Rash    Medications: Current Meds  Medication Sig  . acetaminophen (TYLENOL) 325 MG tablet Take 325 mg by mouth every 4 (four) hours as needed. Reported on 06/23/2016  . ibuprofen (ADVIL,MOTRIN) 600 MG tablet Take 600 mg by mouth every 6 (six) hours as needed.  . methyldopa (ALDOMET) 250 MG tablet Take 1 tablet (250 mg total) by mouth 2 (two) times daily.  . Prenatal Vit-Fe Fumarate-FA (MULTIVITAMIN-PRENATAL) 27-0.8 MG TABS tablet Take 1 tablet by mouth daily at 12 noon.  . sertraline (ZOLOFT) 50 MG tablet Take 50 mg by mouth daily.     Physical Exam Vitals:   08/25/16 2300 08/25/16 2330 08/26/16 0000 08/26/16 0030  BP: (!) 179/108 (!) 174/100 114/69 107/66  Pulse: (!) 55 61 68 81  Resp: 14 14 15 16   Temp:      TempSrc:      SpO2: 100% 99% 100% 98%  Weight:      Height:        General: NAD HEENT: normocephalic, anicteric Pulmonary: no increased work of breathing Cardiovascular:  RRR Abdomen: gravid, non-tender Genitourinary: deferred Extremities: no edema Neurologic: grossly intact Psychiatric: mood appropriate, affect full  Labs: Results for  orders placed or performed during the hospital encounter of 08/25/16 (from the past 24 hour(s))  CBC     Status: Abnormal   Collection Time: 08/25/16  9:56 PM  Result Value Ref Range   WBC 8.1 3.6 - 11.0 K/uL   RBC 4.91 3.80 - 5.20 MIL/uL   Hemoglobin 13.1 12.0 - 16.0 g/dL   HCT 56.239.1 13.035.0 - 86.547.0 %   MCV 79.7 (L) 80.0 - 100.0 fL   MCH 26.7 26.0 - 34.0 pg   MCHC 33.5 32.0 - 36.0 g/dL   RDW 78.414.9 (H) 69.611.5 - 29.514.5 %   Platelets 349 150 - 440 K/uL  Comprehensive metabolic panel     Status: Abnormal   Collection Time: 08/25/16  9:56 PM  Result Value Ref Range   Sodium 138 135 - 145 mmol/L   Potassium 4.2 3.5 - 5.1 mmol/L   Chloride 110 101 -  111 mmol/L   CO2 21 (L) 22 - 32 mmol/L   Glucose, Bld 96 65 - 99 mg/dL   BUN 19 6 - 20 mg/dL   Creatinine, Ser 2.840.70 0.44 - 1.00 mg/dL   Calcium 9.1 8.9 - 13.210.3 mg/dL   Total Protein 7.1 6.5 - 8.1 g/dL   Albumin 3.6 3.5 - 5.0 g/dL   AST 23 15 - 41 U/L   ALT 13 (L) 14 - 54 U/L   Alkaline Phosphatase 115 38 - 126 U/L   Total Bilirubin <0.1 (L) 0.3 - 1.2 mg/dL   GFR calc non Af Amer >60 >60 mL/min   GFR calc Af Amer >60 >60 mL/min   Anion gap 7 5 - 15  Magnesium     Status: None   Collection Time: 08/25/16 10:30 PM  Result Value Ref Range   Magnesium 2.2 1.7 - 2.4 mg/dL    Imaging Ct Head Wo Contrast  Result Date: 08/23/2016 CLINICAL DATA:  One week postpartum. Headache and high blood pressure. EXAM: CT HEAD WITHOUT CONTRAST TECHNIQUE: Contiguous axial images were obtained from the base of the skull through the vertex without intravenous contrast. COMPARISON:  None. FINDINGS: Brain: Ventricles and sulci appear symmetrical. No ventricular dilatation. No mass effect or midline shift. No abnormal extra-axial fluid collections. Gray-white matter junctions are distinct. Basal cisterns are not effaced. No evidence of acute intracranial hemorrhage. Vascular: No hyperdense vessel or unexpected calcification. Skull: No acute depressed skull fractures. Sinuses/Orbits: No acute finding. Other: None IMPRESSION: No acute intracranial abnormalities. Electronically Signed   By: Burman NievesWilliam  Stevens M.D.   On: 08/23/2016 01:40    Assessment: 20 y.o. [id number] with postpartum preeclampsia with severe features by severe range blood pressure and neurologic signs  Plan: - Admit to mother baby - Previously completed 24-hr course of magnesium sulfate  - Labs without abnormalities - Titrate methyldopa (written for 500mg  po tid)

## 2016-08-26 NOTE — Progress Notes (Signed)
Pt is sitting in bed and holding baby. She has had breakfast. She states that her headache is gone for now although worries that it may come back. We discussed watching her for the day to be sure her BP stays in normal range on the Methyldopa.   Pt is alert and oriented and in no apparent distress.  She denies HA, change of vision, CP, SOB, epigastric pain.   BP 110/68   Pulse 63   Temp 98.6 F (37 C) (Oral)   Resp 20   Ht 4\' 11"  (1.499 m)   Wt 56.7 kg (125 lb)   LMP 10/17/2015   SpO2 99%   BMI 25.25 kg/m    Plan: Methyldopa 500mg  TID with reassessment for effectiveness Analgesia PRN for headache Disposition: Home likely tomorrow  Tresea MallGLEDHILL,Abrey Bradway, CNM

## 2016-08-27 MED ORDER — METHYLDOPA 250 MG PO TABS: 250.0000 mg | ORAL_TABLET | Freq: Three times a day (TID) | ORAL | 0 refills | Status: DC

## 2016-08-27 NOTE — Discharge Summary (Signed)
Physician Discharge Summary  Patient ID: Nancy Ball MRN: [id number] DOB/AGE: [birth year] 20 y.o.  Admit date: 08/25/2016 Discharge date: 08/27/2016  Admission Diagnoses: Postpartum preeclampsia  Discharge Diagnoses:  Active Problems:   Preeclampsia in postpartum period   Discharged Condition: good  Hospital Course: Patient admitted for severe range BP, 2nd admission.  Required IV hydralazine and titration of methyldopa for BP control.  Previously complete 24-hr course magnesium sulfate.  BP normotensive at time of discharge  Consults: None  Significant Diagnostic Studies: Results for orders placed or performed during the hospital encounter of 08/25/16 (from the past 72 hour(s))  CBC     Status: Abnormal   Collection Time: 08/25/16  9:56 PM  Result Value Ref Range   WBC 8.1 3.6 - 11.0 K/uL   RBC 4.91 3.80 - 5.20 MIL/uL   Hemoglobin 13.1 12.0 - 16.0 g/dL   HCT 39.1 35.0 - 47.0 %   MCV 79.7 (L) 80.0 - 100.0 fL   MCH 26.7 26.0 - 34.0 pg   MCHC 33.5 32.0 - 36.0 g/dL   RDW 14.9 (H) 11.5 - 14.5 %   Platelets 349 150 - 440 K/uL  Comprehensive metabolic panel     Status: Abnormal   Collection Time: 08/25/16  9:56 PM  Result Value Ref Range   Sodium 138 135 - 145 mmol/L   Potassium 4.2 3.5 - 5.1 mmol/L   Chloride 110 101 - 111 mmol/L   CO2 21 (L) 22 - 32 mmol/L   Glucose, Bld 96 65 - 99 mg/dL   BUN 19 6 - 20 mg/dL   Creatinine, Ser 0.70 0.44 - 1.00 mg/dL   Calcium 9.1 8.9 - 10.3 mg/dL   Total Protein 7.1 6.5 - 8.1 g/dL   Albumin 3.6 3.5 - 5.0 g/dL   AST 23 15 - 41 U/L   ALT 13 (L) 14 - 54 U/L   Alkaline Phosphatase 115 38 - 126 U/L   Total Bilirubin <0.1 (L) 0.3 - 1.2 mg/dL   GFR calc non Af Amer >60 >60 mL/min   GFR calc Af Amer >60 >60 mL/min    Comment: (NOTE) The eGFR has been calculated using the CKD EPI equation. This calculation has not been validated in all clinical situations. eGFR's persistently <60 mL/min signify possible Chronic Kidney Disease.    Anion gap  7 5 - 15  Magnesium     Status: None   Collection Time: 08/25/16 10:30 PM  Result Value Ref Range   Magnesium 2.2 1.7 - 2.4 mg/dL     Treatments: IV hydration and antihypertensives  Discharge Exam: Blood pressure 122/78, pulse 60, temperature 98.4 F (36.9 C), temperature source Oral, resp. rate 18, height _0  (1.499 m), weight 56.7 kg (125 lb), last menstrual period 10/17/2015, SpO2 98 %, not currently breastfeeding. General appearance: alert, appears stated age and no distress GI: fundus firm Extremities: extremities normal, atraumatic, no cyanosis or edema  Disposition: 01-Home or Self Care  Discharge Instructions    Activity as tolerated    Complete by:  As directed   Call MD for:  difficulty breathing, headache or visual disturbances    Complete by:  As directed   Call MD for:  extreme fatigue    Complete by:  As directed   Call MD for:  hives    Complete by:  As directed   Call MD for:  persistant dizziness or light-headedness    Complete by:  As directed   Call MD for:  persistant  nausea and vomiting    Complete by:  As directed   Call MD for:  severe uncontrolled pain    Complete by:  As directed   Call MD for:  temperature >100.4    Complete by:  As directed   Diet - low sodium heart healthy    Complete by:  As directed       Medication List    TAKE these medications   acetaminophen 325 MG tablet Commonly known as:  TYLENOL Take 325 mg by mouth every 4 (four) hours as needed. Reported on 06/23/2016   ibuprofen 600 MG tablet Commonly known as:  ADVIL,MOTRIN Take 600 mg by mouth every 6 (six) hours as needed.   methyldopa 250 MG tablet Commonly known as:  ALDOMET Take 1 tablet (250 mg total) by mouth 3 (three) times daily. What changed:  when to take this   multivitamin-prenatal 27-0.8 MG Tabs tablet Take 1 tablet by mouth daily at 12 noon.   sertraline 50 MG tablet Commonly known as:  ZOLOFT Take 50 mg by mouth daily.      Follow-up Information     Gladie Gravette, Stoney Bang, MD Follow up in 1 week(s).   Specialty:  Obstetrics and Gynecology Why:  BP check Contact information: [address] [place] [address] [phone]           Signed: Dorthula Nettles 08/27/2016, 10:10 AM

## 2016-08-27 NOTE — Progress Notes (Signed)
Called pt about RX left.  Message left

## 2016-11-17 ENCOUNTER — Emergency Department
Admission: EM | Admit: 2016-11-17 | Discharge: 2016-11-17 | Disposition: A | Discharge status: Home / Self Care | Payer: Medicaid Other | Attending: Student in an Organized Health Care Education/Training Program | Admitting: Student in an Organized Health Care Education/Training Program

## 2016-11-17 ENCOUNTER — Encounter: Payer: Self-pay | Admitting: Emergency Medicine

## 2016-11-17 DIAGNOSIS — Y929 Unspecified place or not applicable: Secondary | ICD-10-CM | POA: Diagnosis not present

## 2016-11-17 DIAGNOSIS — X58XXXA Exposure to other specified factors, initial encounter: Secondary | ICD-10-CM | POA: Diagnosis not present

## 2016-11-17 DIAGNOSIS — S3992XA Unspecified injury of lower back, initial encounter: Secondary | ICD-10-CM | POA: Diagnosis present

## 2016-11-17 DIAGNOSIS — J45909 Unspecified asthma, uncomplicated: Secondary | ICD-10-CM | POA: Diagnosis not present

## 2016-11-17 DIAGNOSIS — Y939 Activity, unspecified: Secondary | ICD-10-CM | POA: Insufficient documentation

## 2016-11-17 DIAGNOSIS — S39012A Strain of muscle, fascia and tendon of lower back, initial encounter: Secondary | ICD-10-CM | POA: Diagnosis not present

## 2016-11-17 DIAGNOSIS — Z79899 Other long term (current) drug therapy: Secondary | ICD-10-CM | POA: Diagnosis not present

## 2016-11-17 DIAGNOSIS — Y999 Unspecified external cause status: Secondary | ICD-10-CM | POA: Diagnosis not present

## 2016-11-17 DIAGNOSIS — M545 Low back pain, unspecified: Secondary | ICD-10-CM

## 2016-11-17 DIAGNOSIS — Z87891 Personal history of nicotine dependence: Secondary | ICD-10-CM | POA: Insufficient documentation

## 2016-11-17 LAB — URINALYSIS COMPLETE WITH MICROSCOPIC (ARMC ONLY)
BILIRUBIN URINE: NEGATIVE
Bacteria, UA: NONE SEEN
Glucose, UA: NEGATIVE mg/dL
Hgb urine dipstick: NEGATIVE
Nitrite: NEGATIVE
PH: 6 (ref 5.0–8.0)
PROTEIN: NEGATIVE mg/dL
Specific Gravity, Urine: 1.02 (ref 1.005–1.030)

## 2016-11-17 LAB — POCT PREGNANCY, URINE: Preg Test, Ur: NEGATIVE

## 2016-11-17 MED ORDER — SODIUM CHLORIDE 0.9 % IV BOLUS (SEPSIS)
1000.0000 mL | Freq: Once | INTRAVENOUS | Status: AC
  Administered 2016-11-17: 1000 mL via INTRAVENOUS

## 2016-11-17 MED ORDER — HYDROCODONE-ACETAMINOPHEN 5-325 MG PO TABS
1.0000 | ORAL_TABLET | Freq: Once | ORAL | Status: AC
  Administered 2016-11-17: 1 via ORAL
  Filled 2016-11-17: qty 1

## 2016-11-17 MED ORDER — HYDROCODONE-ACETAMINOPHEN 5-325 MG PO TABS: 1.0000 | ORAL_TABLET | ORAL | 0 refills | Status: DC | PRN

## 2016-11-17 NOTE — ED Triage Notes (Signed)
Pt c/o low back pain

## 2016-11-17 NOTE — Discharge Instructions (Signed)
[facility] if any continued problems. Continue Norco as needed for pain. You may use moist heat or ice to your  back as needed for back pain. Increase fluids.

## 2016-11-17 NOTE — ED Provider Notes (Signed)
[facility] Emergency Department Provider Note  ____________________________________________   None    (approximate)  I have reviewed the triage vital signs and the nursing notes.   HISTORY  Chief Complaint Back Pain   HPI Nancy Ball is a 20 y.o. female is here complaining of low back pain for several days which she believes comes from lifting items at work. She denies any known accidents or injuries. Patient denies any previous problems with her back and has not had any surgeries. Patient states that she had her first kidney stone at age 20. She has had no symptoms of urinary tract infection but states that she has generalized back pain without radiation. Currently she denies any nausea or vomiting. There is been no fever or chills. She denies any injury to her back. She denies any paresthesias to her lower extremities, no incontinence of bowel or bladder. Patient rates her pain as an 8 out of 10.   Past Medical History:  Diagnosis Date  . Anemia during pregnancy   . 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 Asthma    well controlled, last episode in high school with running track  . Depression    PP depression, on zoloft presently  . Postpartum depression     Patient Active Problem List   Diagnosis Date Noted  . Preeclampsia in postpartum period 08/25/2016  . Preeclampsia 08/23/2016  . Labor and delivery indication for care or intervention 08/14/2016  . Irregular contractions 08/10/2016  . Pregnancy 06/30/2016  . Vaginal bleeding during pregnancy, antepartum 06/23/2016  . Amniotic fluid leaking 05/11/2016    Past Surgical History:  Procedure Laterality Date  . NO PAST SURGERIES    . WISDOM TOOTH EXTRACTION Bilateral 2013   states she was put to sleep    Prior to Admission medications   Medication Sig Start Date End Date Taking? Authorizing Provider  acetaminophen (TYLENOL) 325 MG  tablet Take 325 mg by mouth every 4 (four) hours as needed. Reported on 06/23/2016    Historical Provider, MD  HYDROcodone-acetaminophen (NORCO/VICODIN) 5-325 MG tablet Take 1 tablet by mouth every 4 (four) hours as needed for moderate pain. 11/17/16   Tommi Rumpshonda L Campbell Agramonte, PA-C  ibuprofen (ADVIL,MOTRIN) 600 MG tablet Take 600 mg by mouth every 6 (six) hours as needed.    Historical Provider, MD  methyldopa (ALDOMET) 250 MG tablet Take 1 tablet (250 mg total) by mouth 3 (three) times daily. 08/27/16   Vena AustriaAndreas Staebler, MD  Prenatal Vit-Fe Fumarate-FA (MULTIVITAMIN-PRENATAL) 27-0.8 MG TABS tablet Take 1 tablet by mouth daily at 12 noon.    Historical Provider, MD  sertraline (ZOLOFT) 50 MG tablet Take 50 mg by mouth daily.    Historical Provider, MD    Allergies Shellfish allergy and Excedrin migraine [aspirin-acetaminophen-caffeine]  Family History  Problem Relation Age of Onset  . Cancer Paternal Aunt     Social History Social History  Substance Use Topics  . Smoking status: Former Smoker    Packs/day: 0.50    Years: 2.00    Types: Cigarettes    Quit date: 03/05/2016  . Smokeless tobacco: Never Used  . Alcohol use No    Review of Systems Constitutional: No fever/chills ENT: No sore throat. Cardiovascular: Denies chest pain. Respiratory: Denies shortness of breath. Gastrointestinal: No abdominal pain.  No nausea, no vomiting.   Genitourinary: Negative for dysuria. Musculoskeletal: Positive for back pain. Skin: Negative for rash. Neurological: Negative for  headaches, focal weakness or numbness.  10-point ROS otherwise negative.  ____________________________________________   PHYSICAL EXAM:  VITAL SIGNS: ED Triage Vitals  Enc Vitals Group     BP 11/17/16 1740 121/81     Pulse Rate 11/17/16 1740 78     Resp 11/17/16 1740 16     Temp 11/17/16 1740 98 F (36.7 C)     Temp Source 11/17/16 1740 Oral     SpO2 11/17/16 1740 99 %     Weight 11/17/16 1737 125 lb (56.7 kg)      Height 11/17/16 1741 4\' 11"  (1.499 m)     Head Circumference --      Peak Flow --      Pain Score 11/17/16 1737 8     Pain Loc --      Pain Edu? --      Excl. in GC? --     Constitutional: Alert and oriented. Well appearing and in no acute distress. Eyes: Conjunctivae are normal. PERRL. EOMI. Head: Atraumatic. Nose: No congestion/rhinnorhea. Neck: No stridor.   Cardiovascular: Normal rate, regular rhythm. Grossly normal heart sounds.  Good peripheral circulation. Respiratory: Normal respiratory effort.  No retractions. Lungs CTAB. Gastrointestinal: Soft and nontender. No distention. Bowel sounds normoactive 4 quadrants. No CVA tenderness. Musculoskeletal: Examination of the back there is no gross deformity. There is no active muscle spasm seen. Range of motion is restricted secondary patient's discomfort. There is generalized tenderness on palpation of the paravertebral muscles bilaterally from approximately L1 to the sacral area bilaterally. Good muscle strength bilaterally. Neurologic:  Normal speech and language. No gross focal neurologic deficits are appreciated. Reflexes are 2+ bilaterally. No gait instability. Skin:  Skin is warm, dry and intact. No rash noted. No ecchymosis, abrasions or erythema was noted. Psychiatric: Mood and affect are normal. Speech and behavior are normal.  ____________________________________________   LABS (all labs ordered are listed, but only abnormal results are displayed)  Labs Reviewed  URINALYSIS COMPLETEWITH MICROSCOPIC ([facility] ONLY) - Abnormal; Notable for the following:       Result Value   Color, Urine YELLOW (*)    APPearance CLEAR (*)    Ketones, ur 2+ (*)    Leukocytes, UA 1+ (*)    Squamous Epithelial / LPF 0-5 (*)    All other components within normal limits  POC URINE PREG, ED  POCT PREGNANCY, URINE    RADIOLOGY  Deferred ____________________________________________   PROCEDURES  Procedure(s) performed:  None  Procedures  Critical Care performed: No  ____________________________________________   INITIAL IMPRESSION / ASSESSMENT AND PLAN / ED COURSE  Pertinent labs & imaging results that were available during my care of the patient were reviewed by me and considered in my medical decision making (see chart for details).    Clinical Course    ----------------------------------------- 8:32 PM on 11/17/2016 ----------------------------------------- Patient voices that she is feeling better. ----------------------------------------- 8:44 PM on 11/17/2016 ----------------------------------------- Patient is now feeling better and feels like eating. She was given something for a snack. Patient began to feel better and was tolerating liquids and eating without any continued problems. Patient was sitting up watching TV. Decreased back pain. Patient was given a note to remain out of work for the next 2 days. She is also to follow-up with [facility] acute-care if any continued problems.   ___________________________________________   FINAL CLINICAL IMPRESSION(S) / ED DIAGNOSES  Final diagnoses:  Strain of lumbar region, initial encounter  Acute midline low back pain without sciatica  NEW MEDICATIONS STARTED DURING THIS VISIT:  New Prescriptions   HYDROCODONE-ACETAMINOPHEN (NORCO/VICODIN) 5-325 MG TABLET    Take 1 tablet by mouth every 4 (four) hours as needed for moderate pain.     Note:  This document was prepared using Dragon voice recognition software and may include unintentional dictation errors.    Tommi Rumpshonda L Jalyah Weinheimer, PA-C 11/17/16 2126    Tommi Rumpshonda L Blayde Bacigalupi, PA-C 11/17/16 2127    Willy EddyPatrick Robinson, MD 11/17/16 2350

## 2016-11-17 NOTE — ED Notes (Signed)
Discharge instructions reviewed with patient. Questions fielded by this RN. Patient verbalizes understanding of instructions. Patient discharged home in stable condition per Summers PA . No acute distress noted at time of discharge.   

## 2016-12-11 ENCOUNTER — Encounter: Payer: Self-pay | Admitting: Emergency Medicine

## 2016-12-11 DIAGNOSIS — Z87891 Personal history of nicotine dependence: Secondary | ICD-10-CM | POA: Diagnosis not present

## 2016-12-11 DIAGNOSIS — O2341 Unspecified infection of urinary tract in pregnancy, first trimester: Secondary | ICD-10-CM | POA: Diagnosis not present

## 2016-12-11 DIAGNOSIS — Z3A01 Less than 8 weeks gestation of pregnancy: Secondary | ICD-10-CM | POA: Insufficient documentation

## 2016-12-11 DIAGNOSIS — J45909 Unspecified asthma, uncomplicated: Secondary | ICD-10-CM | POA: Diagnosis not present

## 2016-12-11 DIAGNOSIS — R102 Pelvic and perineal pain: Secondary | ICD-10-CM | POA: Diagnosis present

## 2016-12-11 LAB — CBC
HCT: 36.1 % (ref 35.0–47.0)
HEMOGLOBIN: 12.7 g/dL (ref 12.0–16.0)
MCH: 28.6 pg (ref 26.0–34.0)
MCHC: 35.1 g/dL (ref 32.0–36.0)
MCV: 81.7 fL (ref 80.0–100.0)
PLATELETS: 296 10*3/uL (ref 150–440)
RBC: 4.42 MIL/uL (ref 3.80–5.20)
RDW: 13.2 % (ref 11.5–14.5)
WBC: 6.4 10*3/uL (ref 3.6–11.0)

## 2016-12-11 LAB — POCT PREGNANCY, URINE: PREG TEST UR: POSITIVE — AB

## 2016-12-11 NOTE — ED Triage Notes (Signed)
Pt to triage, shuffling gait, holding abd, appears uncomfortable; st approx [redacted]wks pregnant, sudden onset hr PTA stabbing pain to left lower abd; denies any bleeding; G3P2

## 2016-12-12 ENCOUNTER — Emergency Department
Admission: EM | Admit: 2016-12-12 | Discharge: 2016-12-12 | Disposition: A | Discharge status: Home / Self Care | Payer: Medicaid Other | Attending: Emergency Medicine | Admitting: Emergency Medicine

## 2016-12-12 ENCOUNTER — Emergency Department: Payer: Medicaid Other

## 2016-12-12 DIAGNOSIS — R102 Pelvic and perineal pain: Secondary | ICD-10-CM

## 2016-12-12 DIAGNOSIS — Z3A01 Less than 8 weeks gestation of pregnancy: Secondary | ICD-10-CM

## 2016-12-12 DIAGNOSIS — N390 Urinary tract infection, site not specified: Secondary | ICD-10-CM

## 2016-12-12 LAB — WET PREP, GENITAL
Clue Cells Wet Prep HPF POC: NONE SEEN
Sperm: NONE SEEN
Trich, Wet Prep: NONE SEEN
YEAST WET PREP: NONE SEEN

## 2016-12-12 LAB — URINALYSIS, COMPLETE (UACMP) WITH MICROSCOPIC
BILIRUBIN URINE: NEGATIVE
Bacteria, UA: NONE SEEN
Glucose, UA: NEGATIVE mg/dL
Hgb urine dipstick: NEGATIVE
Ketones, ur: NEGATIVE mg/dL
NITRITE: NEGATIVE
PH: 6 (ref 5.0–8.0)
Protein, ur: NEGATIVE mg/dL
SPECIFIC GRAVITY, URINE: 1.009 (ref 1.005–1.030)

## 2016-12-12 LAB — CHLAMYDIA/NGC RT PCR (ARMC ONLY)
Chlamydia Tr: NOT DETECTED
N GONORRHOEAE: NOT DETECTED

## 2016-12-12 LAB — HCG, QUANTITATIVE, PREGNANCY: hCG, Beta Chain, Quant, S: 141789 m[IU]/mL — ABNORMAL HIGH (ref <5)

## 2016-12-12 LAB — ABO/RH: ABO/RH(D): B POS

## 2016-12-12 MED ORDER — NITROFURANTOIN MONOHYD MACRO 100 MG PO CAPS: 100.0000 mg | ORAL_CAPSULE | Freq: Two times a day (BID) | ORAL | 0 refills | Status: DC

## 2016-12-12 MED ORDER — NITROFURANTOIN MONOHYD MACRO 100 MG PO CAPS
100.0000 mg | ORAL_CAPSULE | Freq: Once | ORAL | Status: AC
  Administered 2016-12-12: 100 mg via ORAL
  Filled 2016-12-12: qty 1

## 2016-12-12 NOTE — Discharge Instructions (Signed)
1. Take antibiotic as prescribed (Macrobid 100 mg twice daily 5 days). 2. Return to the ER for worsening symptoms, persistent vomiting, vaginal bleeding or other concerns.

## 2016-12-12 NOTE — ED Notes (Signed)
Pt is in good condition; discharge instructions reviewed, follow up care and home care reviewed; prescription medication reviewed; pt verbalized understanding; pt is ambulatory and went home with mother-in-law.

## 2016-12-12 NOTE — ED Provider Notes (Signed)
[facility] Emergency Department Provider Note   ____________________________________________   First MD Initiated Contact with Patient 12/12/16 0112     (approximate)  I have reviewed the triage vital signs and the nursing notes.   HISTORY  Chief Complaint Abdominal Pain    HPI Nancy Ball is a 20 y.o. female who presents to the ED from home with a chief complaint of pelvic pain. Patient is a G3 P2 approximately [redacted] weeks pregnant by dates who presents with pelvic cramping all day with sudden increase in stabbing pain to her left pelvis prior to arrival. Denies associated vaginal bleeding. Last sexual intercourse 2 days ago. Denies fever, chills, chest pain, shortness of breath, nausea, vomiting, diarrhea, dysuria. Denies recent travel or trauma. Nothing makes her symptoms better or worse.   Past Medical History:  Diagnosis Date  . Anemia during pregnancy   . 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 Asthma    well controlled, last episode in high school with running track  . Depression    PP depression, on zoloft presently  . Postpartum depression   Ovarian cysts  Patient Active Problem List   Diagnosis Date Noted  . Preeclampsia in postpartum period 08/25/2016  . Preeclampsia 08/23/2016  . Labor and delivery indication for care or intervention 08/14/2016  . Irregular contractions 08/10/2016  . Pregnancy 06/30/2016  . Vaginal bleeding during pregnancy, antepartum 06/23/2016  . Amniotic fluid leaking 05/11/2016    Past Surgical History:  Procedure Laterality Date  . NO PAST SURGERIES    . WISDOM TOOTH EXTRACTION Bilateral 2013   states she was put to sleep    Prior to Admission medications   Medication Sig Start Date End Date Taking? Authorizing Provider  acetaminophen (TYLENOL) 325 MG tablet Take 325 mg by mouth every 4 (four) hours as needed. Reported on 06/23/2016    Historical  Provider, MD  HYDROcodone-acetaminophen (NORCO/VICODIN) 5-325 MG tablet Take 1 tablet by mouth every 4 (four) hours as needed for moderate pain. 11/17/16   Tommi Rumps, PA-C  ibuprofen (ADVIL,MOTRIN) 600 MG tablet Take 600 mg by mouth every 6 (six) hours as needed.    Historical Provider, MD  methyldopa (ALDOMET) 250 MG tablet Take 1 tablet (250 mg total) by mouth 3 (three) times daily. 08/27/16   Vena Austria, MD  nitrofurantoin, macrocrystal-monohydrate, (MACROBID) 100 MG capsule Take 1 capsule (100 mg total) by mouth 2 (two) times daily. 12/12/16   Irean Hong, MD  Prenatal Vit-Fe Fumarate-FA (MULTIVITAMIN-PRENATAL) 27-0.8 MG TABS tablet Take 1 tablet by mouth daily at 12 noon.    Historical Provider, MD  sertraline (ZOLOFT) 50 MG tablet Take 50 mg by mouth daily.    Historical Provider, MD    Allergies Shellfish allergy and Excedrin migraine [aspirin-acetaminophen-caffeine]  Family History  Problem Relation Age of Onset  . Cancer Paternal Aunt     Social History Social History  Substance Use Topics  . Smoking status: Former Smoker    Packs/day: 0.50    Years: 2.00    Types: Cigarettes    Quit date: 03/05/2016  . Smokeless tobacco: Never Used  . Alcohol use No    Review of Systems  Constitutional: No fever/chills. Eyes: No visual changes. ENT: No sore throat. Cardiovascular: Denies chest pain. Respiratory: Denies shortness of breath. Gastrointestinal: Positive for pelvic cramping. No abdominal pain.  No nausea, no vomiting.  No diarrhea.  No constipation. Genitourinary: Negative for  dysuria. Musculoskeletal: Negative for back pain. Skin: Negative for rash. Neurological: Negative for headaches, focal weakness or numbness.  10-point ROS otherwise negative.  ____________________________________________   PHYSICAL EXAM:  VITAL SIGNS: ED Triage Vitals  Enc Vitals Group     BP 12/11/16 2344 111/67     Pulse Rate 12/11/16 2344 76     Resp 12/11/16 2344 18      Temp 12/11/16 2344 97.8 F (36.6 C)     Temp Source 12/11/16 2344 Oral     SpO2 12/11/16 2344 100 %     Weight 12/11/16 2341 125 lb (56.7 kg)     Height 12/11/16 2341 4\' 11"  (1.499 m)     Head Circumference --      Peak Flow --      Pain Score 12/11/16 2341 10     Pain Loc --      Pain Edu? --      Excl. in GC? --     Constitutional: Alert and oriented. Well appearing and in no acute distress. Eyes: Conjunctivae are normal. PERRL. EOMI. Head: Atraumatic. Nose: No congestion/rhinnorhea. Mouth/Throat: Mucous membranes are moist.  Oropharynx non-erythematous. Neck: No stridor.   Cardiovascular: Normal rate, regular rhythm. Grossly normal heart sounds.  Good peripheral circulation. Respiratory: Normal respiratory effort.  No retractions. Lungs CTAB. Gastrointestinal: Soft and nontender to light and deep palpation. No distention. No abdominal bruits. No CVA tenderness. Musculoskeletal: No lower extremity tenderness nor edema.  No joint effusions. Neurologic:  Normal speech and language. No gross focal neurologic deficits are appreciated. No gait instability. Skin:  Skin is warm, dry and intact. No rash noted. Psychiatric: Mood and affect are normal. Speech and behavior are normal.  ____________________________________________   LABS (all labs ordered are listed, but only abnormal results are displayed)  Labs Reviewed  WET PREP, GENITAL - Abnormal; Notable for the following:       Result Value   WBC, Wet Prep HPF POC MODERATE (*)    All other components within normal limits  URINALYSIS, COMPLETE (UACMP) WITH MICROSCOPIC - Abnormal; Notable for the following:    Color, Urine STRAW (*)    APPearance HAZY (*)    Leukocytes, UA LARGE (*)    Squamous Epithelial / LPF 6-30 (*)    All other components within normal limits  HCG, QUANTITATIVE, PREGNANCY - Abnormal; Notable for the following:    hCG, Beta Chain, Quant, S 141,789 (*)    All other components within normal limits  POCT  PREGNANCY, URINE - Abnormal; Notable for the following:    Preg Test, Ur POSITIVE (*)    All other components within normal limits  CHLAMYDIA/NGC RT PCR ([facility] ONLY)  CBC  ABO/RH   ____________________________________________  EKG  None ____________________________________________  RADIOLOGY  OB ultrasound interpreted per Dr. Clovis RileyMitchell: Single living intrauterine gestation measuring 6 weeks 4 days by  crown-rump length.   ____________________________________________   PROCEDURES  Procedure(s) performed:   Pelvic exam: External exam WNL without rashes, lesions or vesicles. Speculum exam reveals mild white discharge. No vaginal bleeding. Cervical os closed. Bimanual exam WNL.  Procedures  Critical Care performed: No  ____________________________________________   INITIAL IMPRESSION / ASSESSMENT AND PLAN / ED COURSE  Pertinent labs & imaging results that were available during my care of the patient were reviewed by me and considered in my medical decision making (see chart for details).  20 year old female G3 P2 who presents for pelvic cramping. Laboratory urinalysis results demonstrate UTI; pelvic ultrasound reveals IUP approximately  6 weeks and 4 days. Wet prep and DNA obtained; awaiting results.  Clinical Course as of Dec 12 736  Fri Dec 12, 2016  0235 Patient resting in no acute distress. Updated her of wet prep results. She'll be notified of any positive DNA results. Will place on Macrobid for UTI, and patient will follow-up with her OB/GYN closely. Strict return precautions given. Patient verbalizes understanding and plan of care.  [JS]  0736 Addendum on chart review: Chlamydia and gonorrhea are negative.  [JS]    Clinical Course User Index [JS] Irean HongJade J Sung, MD     ____________________________________________   FINAL CLINICAL IMPRESSION(S) / ED DIAGNOSES  Final diagnoses:  Pelvic pain in female  Less than [redacted] weeks gestation of pregnancy  Lower  urinary tract infectious disease      NEW MEDICATIONS STARTED DURING THIS VISIT:  Discharge Medication List as of 12/12/2016  2:38 AM    START taking these medications   Details  nitrofurantoin, macrocrystal-monohydrate, (MACROBID) 100 MG capsule Take 1 capsule (100 mg total) by mouth 2 (two) times daily., Starting Fri 12/12/2016, Print         Note:  This document was prepared using Dragon voice recognition software and may include unintentional dictation errors.    Irean HongJade J Sung, MD 12/12/16 [phone]

## 2016-12-22 NOTE — L&D Delivery Note (Signed)
Delivery Note At 5:24 PM a viable female infant was delivered via Vaginal, Spontaneous Delivery (Presentation: LOA).  APGAR: 8, 9; weight 7 lb 9.7 oz (3450 g).   Placenta status: spontaneously, intact.  Cord: 3VC with the following complications: None.  Cord pH: n/a  Anesthesia:  epidural Episiotomy: None Lacerations: 2nd degree;Vaginal;Perineal Suture Repair: 3.0 vicryl Est. Blood Loss (mL): 400  Mom to postpartum.  Baby to Couplet care / Skin to Skin.  Called to see patient.  Mom pushed to deliver a viable female infant.  The head followed by shoulders, which delivered without difficulty, and the rest of the body.  A single nuchal cord noted and delivered through.  Baby to mom's chest.  Cord clamped and cut after > 1 min delay.  No cord blood obtained.  Placenta delivered spontaneously, intact, with a 3-vessel cord.  Second degree perineal laceration repaired with 3-0 Vicryl in standard fashion.  All counts correct.  Hemostasis obtained with IV pitocin and fundal massage. EBL 400 mL.     Thomasene MohairStephen Jackson, MD 07/29/2017, 5:48 PM

## 2017-01-20 ENCOUNTER — Emergency Department
Admission: EM | Admit: 2017-01-20 | Discharge: 2017-01-20 | Disposition: A | Discharge status: Home / Self Care | Payer: Medicaid Other | Attending: Emergency Medicine | Admitting: Emergency Medicine

## 2017-01-20 ENCOUNTER — Encounter: Payer: Self-pay | Admitting: Emergency Medicine

## 2017-01-20 DIAGNOSIS — Z79899 Other long term (current) drug therapy: Secondary | ICD-10-CM | POA: Diagnosis not present

## 2017-01-20 DIAGNOSIS — Z87891 Personal history of nicotine dependence: Secondary | ICD-10-CM | POA: Insufficient documentation

## 2017-01-20 DIAGNOSIS — Z791 Long term (current) use of non-steroidal anti-inflammatories (NSAID): Secondary | ICD-10-CM | POA: Diagnosis not present

## 2017-01-20 DIAGNOSIS — M79675 Pain in left toe(s): Secondary | ICD-10-CM | POA: Diagnosis present

## 2017-01-20 DIAGNOSIS — L03032 Cellulitis of left toe: Secondary | ICD-10-CM | POA: Insufficient documentation

## 2017-01-20 MED ORDER — CEPHALEXIN 500 MG PO CAPS: 500.0000 mg | ORAL_CAPSULE | Freq: Three times a day (TID) | ORAL | 0 refills | Status: DC

## 2017-01-20 NOTE — ED Provider Notes (Signed)
[facility] Emergency Department Provider Note  ____________________________________________  Time seen: Approximately 9:53 AM  I have reviewed the triage vital signs and the nursing notes.   HISTORY  Chief Complaint Nail Problem    HPI Nancy Ball is a 21 y.o. female , NAD, presents to the emergency department for evaluation of a toe infection. States she noticed mild pain about the left great toe 1 week ago. Has been cleansing regularly and applying antibiotic ointment with no relief. Noticed "green" oozing from the area a few days ago. Denies redness, swelling, abnormal warmth or cold. Denies injuries, traumas or falls. States she is careful when she cuts her nails and tries not to cut her great toenails too short. Groomed her nails approximately 2 weeks ago. Denies fevers, chills, body aches.    Past Medical History:  Diagnosis Date  . Anemia during pregnancy   . 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 Asthma    well controlled, last episode in high school with running track  . Depression    PP depression, on zoloft presently  . Postpartum depression     Patient Active Problem List   Diagnosis Date Noted  . Preeclampsia in postpartum period 08/25/2016  . Preeclampsia 08/23/2016  . Labor and delivery indication for care or intervention 08/14/2016  . Irregular contractions 08/10/2016  . Pregnancy 06/30/2016  . Vaginal bleeding during pregnancy, antepartum 06/23/2016  . Amniotic fluid leaking 05/11/2016    Past Surgical History:  Procedure Laterality Date  . NO PAST SURGERIES    . WISDOM TOOTH EXTRACTION Bilateral 2013   states she was put to sleep    Prior to Admission medications   Medication Sig Start Date End Date Taking? Authorizing Provider  acetaminophen (TYLENOL) 325 MG tablet Take 325 mg by mouth every 4 (four) hours as needed. Reported on 06/23/2016    Historical Provider, MD   cephALEXin (KEFLEX) 500 MG capsule Take 1 capsule (500 mg total) by mouth 3 (three) times daily. 01/20/17   Mykale Gandolfo L Jamicheal Heard, PA-C  HYDROcodone-acetaminophen (NORCO/VICODIN) 5-325 MG tablet Take 1 tablet by mouth every 4 (four) hours as needed for moderate pain. 11/17/16   Tommi Rumpshonda L Summers, PA-C  ibuprofen (ADVIL,MOTRIN) 600 MG tablet Take 600 mg by mouth every 6 (six) hours as needed.    Historical Provider, MD  methyldopa (ALDOMET) 250 MG tablet Take 1 tablet (250 mg total) by mouth 3 (three) times daily. 08/27/16   Vena AustriaAndreas Staebler, MD  nitrofurantoin, macrocrystal-monohydrate, (MACROBID) 100 MG capsule Take 1 capsule (100 mg total) by mouth 2 (two) times daily. 12/12/16   Irean HongJade J Sung, MD  Prenatal Vit-Fe Fumarate-FA (MULTIVITAMIN-PRENATAL) 27-0.8 MG TABS tablet Take 1 tablet by mouth daily at 12 noon.    Historical Provider, MD  sertraline (ZOLOFT) 50 MG tablet Take 50 mg by mouth daily.    Historical Provider, MD    Allergies Shellfish allergy and Excedrin migraine [aspirin-acetaminophen-caffeine]  Family History  Problem Relation Age of Onset  . Cancer Paternal Aunt     Social History Social History  Substance Use Topics  . Smoking status: Former Smoker    Packs/day: 0.50    Years: 2.00    Types: Cigarettes    Quit date: 03/05/2016  . Smokeless tobacco: Never Used  . Alcohol use No     Review of Systems  Constitutional: No fever/chills Musculoskeletal: Negative for left lower leg, ankle, foot, toe pain.  Skin: Positive  oozing and weeping about the left great toenail. Negative for redness, abnormal warmth. Neurological: Negative for numbness, weakness, tingling  ____________________________________________   PHYSICAL EXAM:  VITAL SIGNS: ED Triage Vitals  Enc Vitals Group     BP 01/20/17 0946 113/79     Pulse Rate 01/20/17 0946 80     Resp 01/20/17 0946 18     Temp 01/20/17 0946 98 F (36.7 C)     Temp Source 01/20/17 0946 Oral     SpO2 01/20/17 0946 100 %     Weight  01/20/17 0945 125 lb (56.7 kg)     Height 01/20/17 0946 4\' 11"  (1.499 m)     Head Circumference --      Peak Flow --      Pain Score --      Pain Loc --      Pain Edu? --      Excl. in GC? --      Constitutional: Alert and oriented. Well appearing and in no acute distress. Eyes: Conjunctivae are normal.  Head: Atraumatic. Cardiovascular:  Good peripheral circulation with 2+ pulses noted int he left lower extremity. Capillary refill is brisk in all digits of the left foot. Respiratory: Normal respiratory effort without tachypnea or retractions.  Musculoskeletal: No lower extremity tenderness nor edema.  No joint effusions. Neurologic:  Normal speech and language. No gross focal neurologic deficits are appreciated.  Skin:  Skin is warm, dry and intact. No rash noted. Lateral portion of the left great toe at the junction of the nail and skin with yellow crusting and mild pain to palpation. No active oozing or weeping. No abnormal warmth or redness. Toenail is intact and not ingrown. Psychiatric: Mood and affect are normal. Speech and behavior are normal. Patient exhibits appropriate insight and judgement.   ____________________________________________   LABS  None ____________________________________________  EKG  None ____________________________________________  RADIOLOGY  None ____________________________________________    PROCEDURES  Procedure(s) performed: None   Procedures   Medications - No data to display   ____________________________________________   INITIAL IMPRESSION / ASSESSMENT AND PLAN / ED COURSE  Pertinent labs & imaging results that were available during my care of the patient were reviewed by me and considered in my medical decision making (see chart for details).     Patient's diagnosis is consistent with paronychia of left great toe. Patient will be discharged home with prescriptions for cephalexin to take as directed. Patient also  advised to soak the toe in warm epsom salt bath and pat dry 3 times daily. Patient is to follow up with Dr. Alberteen Spindle in podiatry if symptoms persist past this treatment course. Patient is given ED precautions to return to the ED for any worsening or new symptoms.    ____________________________________________  FINAL CLINICAL IMPRESSION(S) / ED DIAGNOSES  Final diagnoses:  Paronychia of great toe, left      NEW MEDICATIONS STARTED DURING THIS VISIT:  Discharge Medication List as of 01/20/2017  9:54 AM    START taking these medications   Details  cephALEXin (KEFLEX) 500 MG capsule Take 1 capsule (500 mg total) by mouth 3 (three) times daily., Starting Tue 01/20/2017, Print             Ernestene Kiel Waskom, PA-C 01/20/17 1029    Emily Filbert, MD 01/20/17 1130

## 2017-01-20 NOTE — ED Triage Notes (Signed)
Pt c/o split toenail

## 2017-02-18 ENCOUNTER — Other Ambulatory Visit: Payer: Self-pay | Admitting: Certified Nurse Midwife

## 2017-02-18 DIAGNOSIS — O3680X0 Pregnancy with inconclusive fetal viability, not applicable or unspecified: Secondary | ICD-10-CM

## 2017-02-18 DIAGNOSIS — Z3482 Encounter for supervision of other normal pregnancy, second trimester: Secondary | ICD-10-CM

## 2017-02-23 ENCOUNTER — Ambulatory Visit (INDEPENDENT_AMBULATORY_CARE_PROVIDER_SITE_OTHER): Payer: Medicaid Other

## 2017-02-23 ENCOUNTER — Ambulatory Visit (INDEPENDENT_AMBULATORY_CARE_PROVIDER_SITE_OTHER): Payer: Medicaid Other | Admitting: Certified Nurse Midwife

## 2017-02-23 ENCOUNTER — Encounter: Payer: Self-pay | Admitting: Certified Nurse Midwife

## 2017-02-23 VITALS — BP 98/58 | Wt 129.0 lb

## 2017-02-23 DIAGNOSIS — Z139 Encounter for screening, unspecified: Secondary | ICD-10-CM

## 2017-02-23 DIAGNOSIS — Z3A18 18 weeks gestation of pregnancy: Secondary | ICD-10-CM | POA: Diagnosis not present

## 2017-02-23 DIAGNOSIS — O3680X0 Pregnancy with inconclusive fetal viability, not applicable or unspecified: Secondary | ICD-10-CM

## 2017-02-23 DIAGNOSIS — J4520 Mild intermittent asthma, uncomplicated: Secondary | ICD-10-CM

## 2017-02-23 DIAGNOSIS — Z8759 Personal history of other complications of pregnancy, childbirth and the puerperium: Secondary | ICD-10-CM

## 2017-02-23 DIAGNOSIS — O0992 Supervision of high risk pregnancy, unspecified, second trimester: Secondary | ICD-10-CM

## 2017-02-23 DIAGNOSIS — Z3490 Encounter for supervision of normal pregnancy, unspecified, unspecified trimester: Secondary | ICD-10-CM

## 2017-02-23 DIAGNOSIS — J45909 Unspecified asthma, uncomplicated: Secondary | ICD-10-CM | POA: Insufficient documentation

## 2017-02-23 MED ORDER — ASPIRIN EC 81 MG PO TBEC: 81.0000 mg | DELAYED_RELEASE_TABLET | Freq: Every day | ORAL | 1 refills | Status: DC

## 2017-02-23 NOTE — Progress Notes (Signed)
F/u dating scan today. Dates to be adjusted. Pt reports no problems.

## 2017-02-25 ENCOUNTER — Encounter: Payer: Self-pay | Admitting: Certified Nurse Midwife

## 2017-02-25 NOTE — Progress Notes (Signed)
Dating scan today: CGA 18wk changing EDC to 07/27/2017. Feeling flutters NOB labs reviewed. Will start baby ASA daily for history of preeclampsia. RTO in 2 weeks for anatomy scan completion. Quad screen today

## 2017-03-01 LAB — AFP, QUAD SCREEN
DIA Mom Value: 0.84
DIA Value (EIA): 161.78 pg/mL
DSR (By Age)    1 IN: 1148
DSR (Second Trimester) 1 IN: 10000
Gestational Age: 18 WEEKS
MATERNAL AGE AT EDD: 21 a
MSAFP Mom: 0.9
MSAFP: 47.1 ng/mL
MSHCG MOM: 2.4
MSHCG: 73871 m[IU]/mL
Osb Risk: 10000
TEST RESULTS AFP: NEGATIVE
WEIGHT: 129 [lb_av]
uE3 Mom: 1.05
uE3 Value: 1.47 ng/mL

## 2017-03-09 ENCOUNTER — Other Ambulatory Visit: Payer: Self-pay | Admitting: Obstetrics and Gynecology

## 2017-03-09 ENCOUNTER — Ambulatory Visit (INDEPENDENT_AMBULATORY_CARE_PROVIDER_SITE_OTHER): Payer: Medicaid Other

## 2017-03-09 ENCOUNTER — Ambulatory Visit (INDEPENDENT_AMBULATORY_CARE_PROVIDER_SITE_OTHER): Payer: Medicaid Other | Admitting: Obstetrics and Gynecology

## 2017-03-09 VITALS — BP 100/52 | Wt 136.0 lb

## 2017-03-09 DIAGNOSIS — Z3492 Encounter for supervision of normal pregnancy, unspecified, second trimester: Secondary | ICD-10-CM | POA: Diagnosis not present

## 2017-03-09 DIAGNOSIS — O0992 Supervision of high risk pregnancy, unspecified, second trimester: Secondary | ICD-10-CM

## 2017-03-09 DIAGNOSIS — Z3490 Encounter for supervision of normal pregnancy, unspecified, unspecified trimester: Secondary | ICD-10-CM

## 2017-03-09 DIAGNOSIS — Z8759 Personal history of other complications of pregnancy, childbirth and the puerperium: Secondary | ICD-10-CM

## 2017-03-09 NOTE — Progress Notes (Signed)
EDC changed to 08/03/17 based on 6 week CRL done at [facility], normal anatomy per ultrasonographer, unable to pull up images at this time

## 2017-03-09 NOTE — Progress Notes (Signed)
Dizziness/Migraines/Anatomy scan today

## 2017-03-09 NOTE — Patient Instructions (Signed)

## 2017-04-06 ENCOUNTER — Ambulatory Visit (INDEPENDENT_AMBULATORY_CARE_PROVIDER_SITE_OTHER): Payer: Medicaid Other | Admitting: Advanced Practice Midwife

## 2017-04-06 VITALS — BP 100/70 | Wt 139.0 lb

## 2017-04-06 DIAGNOSIS — Z3A23 23 weeks gestation of pregnancy: Secondary | ICD-10-CM

## 2017-04-06 DIAGNOSIS — Z113 Encounter for screening for infections with a predominantly sexual mode of transmission: Secondary | ICD-10-CM

## 2017-04-06 DIAGNOSIS — Z130 Encounter for screening for diseases of the blood and blood-forming organs and certain disorders involving the immune mechanism: Secondary | ICD-10-CM

## 2017-04-06 DIAGNOSIS — Z131 Encounter for screening for diabetes mellitus: Secondary | ICD-10-CM

## 2017-04-06 NOTE — Progress Notes (Signed)
Doing well. Has c/o pelvic pressure. Denies urinary symptoms, ctx's, LOF, VB. Recommended abdominal support band. 28 week labs at [facility].

## 2017-04-14 ENCOUNTER — Observation Stay
Admission: EM | Admit: 2017-04-14 | Discharge: 2017-04-15 | Disposition: A | Discharge status: Home / Self Care | Payer: Medicaid Other | Attending: Obstetrics and Gynecology | Admitting: Obstetrics and Gynecology

## 2017-04-14 DIAGNOSIS — Z91013 Allergy to seafood: Secondary | ICD-10-CM | POA: Insufficient documentation

## 2017-04-14 DIAGNOSIS — Z3A24 24 weeks gestation of pregnancy: Secondary | ICD-10-CM | POA: Insufficient documentation

## 2017-04-14 DIAGNOSIS — O210 Mild hyperemesis gravidarum: Principal | ICD-10-CM | POA: Insufficient documentation

## 2017-04-14 DIAGNOSIS — F329 Major depressive disorder, single episode, unspecified: Secondary | ICD-10-CM | POA: Insufficient documentation

## 2017-04-14 DIAGNOSIS — Z79899 Other long term (current) drug therapy: Secondary | ICD-10-CM | POA: Insufficient documentation

## 2017-04-14 DIAGNOSIS — O26892 Other specified pregnancy related conditions, second trimester: Secondary | ICD-10-CM | POA: Diagnosis not present

## 2017-04-14 DIAGNOSIS — Z888 Allergy status to other drugs, medicaments and biological substances status: Secondary | ICD-10-CM | POA: Insufficient documentation

## 2017-04-14 DIAGNOSIS — R112 Nausea with vomiting, unspecified: Secondary | ICD-10-CM

## 2017-04-14 DIAGNOSIS — J45909 Unspecified asthma, uncomplicated: Secondary | ICD-10-CM | POA: Diagnosis not present

## 2017-04-14 DIAGNOSIS — Z7982 Long term (current) use of aspirin: Secondary | ICD-10-CM | POA: Insufficient documentation

## 2017-04-14 DIAGNOSIS — Z87891 Personal history of nicotine dependence: Secondary | ICD-10-CM | POA: Diagnosis not present

## 2017-04-14 DIAGNOSIS — R030 Elevated blood-pressure reading, without diagnosis of hypertension: Secondary | ICD-10-CM | POA: Diagnosis not present

## 2017-04-14 DIAGNOSIS — O99342 Other mental disorders complicating pregnancy, second trimester: Secondary | ICD-10-CM | POA: Diagnosis not present

## 2017-04-14 DIAGNOSIS — R1011 Right upper quadrant pain: Secondary | ICD-10-CM | POA: Diagnosis not present

## 2017-04-14 MED ORDER — ONDANSETRON 4 MG PO TBDP
ORAL_TABLET | ORAL | Status: AC
  Administered 2017-04-14: 8 mg via ORAL
  Filled 2017-04-14: qty 2

## 2017-04-14 MED ORDER — ONDANSETRON 4 MG PO TBDP
8.0000 mg | ORAL_TABLET | Freq: Once | ORAL | Status: AC
  Administered 2017-04-14: 8 mg via ORAL

## 2017-04-14 NOTE — OB Triage Note (Signed)
Patient came in for observation for nausea, vomiting, and increased blood pressure at home of 200/100 that started around 2045. Patient ate a hot dog and threw that up and 20 minutes later she ate steak and that came back up as well. Patient states she has not been up to keep anything down since then. Patient denies uterine contractions at this time. Patient states she has been feeling the baby move fine and no other complaints. Patient denies leaking of fluid, denies vaginal bleeding and spotting. Vital signs stable and patient afebrile. FHR 140. Significant other at bedside. Will continue to monitor.

## 2017-04-14 NOTE — Final Progress Note (Signed)
Physician Final Progress Note  Patient ID: ALEXY BRINGLE MRN: [id number] DOB/AGE: 09/15/1996 21 y.o.  Admit date: 04/14/2017 Admitting provider: Conard Novak, MD Discharge date: 04/15/2017   Admission Diagnoses:  1) intrauterine pregnancy at [redacted]w[redacted]d  2) nausea and vomiting 3) elevated BP at home  Discharge Diagnoses:  1) intrauterine pregnancy at [redacted]w[redacted]d  2) nausea and vomiting 3) elevated BP at home, normal BP here.  History of Present Illness: The patient is a 21 y.o. female G3P2002 at [redacted]w[redacted]d who presents for nausea with onset at 830 pm tonight.  She had a hot dog.  She then had some steak.  After this she began having nausea with emesis. She has been unable to keep any food down or drink since then.  She also took her BP and states that it was elevated at home, her numbers were 200/100. She denies HA, visual changes, and RUQ pain. She notes +FM, no LOF, no vagina bleeding. She also denies contractions.   Hospital Course: admitted for observation.  Given PO zofran and was able to tolerate clear liquids and some solids. Normal tracing for fetus given GA.  All vitals well within the normal rage.  Once feeling better she was discharged with a prescription for zofran with usual follow up recommended.  Past Medical History:  Diagnosis Date  . Anemia during pregnancy   . 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 Asthma    well controlled, last episode in high school with running track  . Depression    PP depression, on zoloft presently  . Postpartum depression     Past Surgical History:  Procedure Laterality Date  . WISDOM TOOTH EXTRACTION Bilateral 2013   states she was put to sleep    No current facility-administered medications on file prior to encounter.    Current Outpatient Prescriptions on File Prior to Encounter  Medication Sig Dispense Refill  . aspirin EC 81 MG tablet Take 1 tablet (81 mg total) by mouth daily. Take  after 12 weeks for prevention of preeclampssia later in pregnancy 90 tablet 1  . Prenatal Vit-Fe Fumarate-FA (MULTIVITAMIN-PRENATAL) 27-0.8 MG TABS tablet Take 1 tablet by mouth daily at 12 noon.    Marland Kitchen acetaminophen (TYLENOL) 325 MG tablet Take 325 mg by mouth every 4 (four) hours as needed. Reported on 06/23/2016    . albuterol (PROVENTIL HFA;VENTOLIN HFA) 108 (90 Base) MCG/ACT inhaler Inhale into the lungs.    . beclomethasone (QVAR) 40 MCG/ACT inhaler Inhale into the lungs.    Marland Kitchen EPINEPHrine 0.3 mg/0.3 mL IJ SOAJ injection Inject contents of entire pen into anterior thigh as needed for swelling of lips, tongue, airway, difficulty breathing, and vomiting.      Allergies  Allergen Reactions  . Shellfish Allergy Anaphylaxis  . Excedrin Migraine [Aspirin-Acetaminophen-Caffeine] Rash    Social History   Social History  . Marital status: Married    Spouse name: N/A  . Number of children: N/A  . Years of education: N/A   Occupational History  . Not on file.   Social History Main Topics  . Smoking status: Former Smoker    Packs/day: 0.50    Years: 2.00    Types: Cigarettes    Quit date: 03/05/2016  . Smokeless tobacco: Never Used  . Alcohol use No  . Drug use: No  . Sexual activity: No   Other Topics Concern  . Not on file   Social History Narrative  .  No narrative on file    Physical Exam: BP (!) 107/58 (BP Location: Right Arm)   Pulse 72   Temp 98.2 F (36.8 C) (Oral)   Resp 18   Ht  (1.499 m)   Wt 145 lb (65.8 kg)   LMP 11/03/2016   BMI 29.29 kg/m    Physical Exam  Constitutional: She is oriented to person, place, and time. She appears distressed.  HENT:  Head: Normocephalic and atraumatic.  Eyes: Conjunctivae are normal. No scleral icterus.  Cardiovascular: Normal rate and regular rhythm.   Pulmonary/Chest: Effort normal and breath sounds normal.  Abdominal:  Gravid, nontender  Genitourinary:  Genitourinary Comments: deferred  Musculoskeletal: Normal  range of motion. She exhibits no edema.  Neurological: She is alert and oriented to person, place, and time. No cranial nerve deficit.  Skin: Skin is warm and dry. No erythema.  Psychiatric: Mood, affect and judgment normal.     Consults: None  Significant Findings/ Diagnostic Studies: none  Procedures: NST Fetal heart tones present and normal  Discharge Condition: improved  Disposition: 01-Home or Self Care  Diet: fluids, increasing to bland foods  Discharge Activity: Activity as tolerated   Allergies as of 04/15/2017      Reactions   Shellfish Allergy Anaphylaxis   Excedrin Migraine [aspirin-acetaminophen-caffeine] Rash      Medication List    STOP taking these medications   EPINEPHrine 0.3 mg/0.3 mL Soaj injection Commonly known as:  EPI-PEN   QVAR 40 MCG/ACT inhaler Generic drug:  beclomethasone     TAKE these medications   acetaminophen 325 MG tablet Commonly known as:  TYLENOL Take 325 mg by mouth every 4 (four) hours as needed. Reported on 06/23/2016   albuterol 108 (90 Base) MCG/ACT inhaler Commonly known as:  PROVENTIL HFA;VENTOLIN HFA Inhale into the lungs.   aspirin EC 81 MG tablet Take 1 tablet (81 mg total) by mouth daily. Take after 12 weeks for prevention of preeclampssia later in pregnancy   multivitamin-prenatal 27-0.8 MG Tabs tablet Take 1 tablet by mouth daily at 12 noon.   ondansetron 8 MG disintegrating tablet Commonly known as:  ZOFRAN ODT Take 1 tablet (8 mg total) by mouth every 8 (eight) hours as needed for nausea or vomiting.      Follow-up Information    [facility] Follow up.   Why:  please keep scheduled OB appointment Contact information: [address] [place] [address] [phone]          Total time spent taking care of this patient: 25 minutes  Signed: Thomasene Mohair, MD  04/15/2017, 1:10 AM

## 2017-04-15 DIAGNOSIS — O210 Mild hyperemesis gravidarum: Secondary | ICD-10-CM | POA: Diagnosis not present

## 2017-04-15 MED ORDER — ONDANSETRON 8 MG PO TBDP: 8.0000 mg | ORAL_TABLET | Freq: Three times a day (TID) | ORAL | 0 refills | Status: DC | PRN

## 2017-04-15 NOTE — Discharge Summary (Signed)
Patient discharged with instructions on oral hydration, discussed new prescription for PO Zofran, oral hydration, follow up appointment, and when to seek medical attention. Patient ambulatory at discharge with steady gait and no complaints. Patient discharged with significant other. Patient verbalized understanding of discharge instructions.

## 2017-04-21 ENCOUNTER — Encounter: Payer: Self-pay | Admitting: Certified Nurse Midwife

## 2017-04-27 ENCOUNTER — Observation Stay
Admission: EM | Admit: 2017-04-27 | Discharge: 2017-04-27 | Disposition: A | Discharge status: Home / Self Care | Payer: Medicaid Other | Attending: Certified Nurse Midwife | Admitting: Certified Nurse Midwife

## 2017-04-27 DIAGNOSIS — R109 Unspecified abdominal pain: Secondary | ICD-10-CM

## 2017-04-27 DIAGNOSIS — Z3A26 26 weeks gestation of pregnancy: Secondary | ICD-10-CM | POA: Insufficient documentation

## 2017-04-27 DIAGNOSIS — O26892 Other specified pregnancy related conditions, second trimester: Principal | ICD-10-CM | POA: Diagnosis present

## 2017-04-27 DIAGNOSIS — Z87891 Personal history of nicotine dependence: Secondary | ICD-10-CM | POA: Diagnosis not present

## 2017-04-27 DIAGNOSIS — O99891 Other specified diseases and conditions complicating pregnancy: Secondary | ICD-10-CM | POA: Diagnosis present

## 2017-04-27 DIAGNOSIS — N760 Acute vaginitis: Secondary | ICD-10-CM | POA: Diagnosis present

## 2017-04-27 DIAGNOSIS — R1010 Upper abdominal pain, unspecified: Secondary | ICD-10-CM | POA: Diagnosis not present

## 2017-04-27 DIAGNOSIS — O23592 Infection of other part of genital tract in pregnancy, second trimester: Secondary | ICD-10-CM | POA: Diagnosis not present

## 2017-04-27 DIAGNOSIS — M549 Dorsalgia, unspecified: Secondary | ICD-10-CM | POA: Insufficient documentation

## 2017-04-27 DIAGNOSIS — R1030 Lower abdominal pain, unspecified: Secondary | ICD-10-CM | POA: Diagnosis not present

## 2017-04-27 DIAGNOSIS — B9689 Other specified bacterial agents as the cause of diseases classified elsewhere: Secondary | ICD-10-CM | POA: Diagnosis not present

## 2017-04-27 DIAGNOSIS — O9989 Other specified diseases and conditions complicating pregnancy, childbirth and the puerperium: Secondary | ICD-10-CM

## 2017-04-27 HISTORY — DX: Essential (primary) hypertension: I10

## 2017-04-27 LAB — URINALYSIS, COMPLETE (UACMP) WITH MICROSCOPIC
Bilirubin Urine: NEGATIVE
GLUCOSE, UA: NEGATIVE mg/dL
Hgb urine dipstick: NEGATIVE
KETONES UR: 20 mg/dL — AB
Nitrite: NEGATIVE
PH: 6 (ref 5.0–8.0)
Protein, ur: NEGATIVE mg/dL
SPECIFIC GRAVITY, URINE: 1.009 (ref 1.005–1.030)

## 2017-04-27 LAB — CHLAMYDIA/NGC RT PCR (ARMC ONLY)
Chlamydia Tr: NOT DETECTED
N GONORRHOEAE: NOT DETECTED

## 2017-04-27 MED ORDER — METRONIDAZOLE 500 MG PO TABS: 500.0000 mg | ORAL_TABLET | Freq: Two times a day (BID) | ORAL | 0 refills | Status: AC

## 2017-04-27 MED ORDER — LACTATED RINGERS IV SOLN: 500.0000 mL | INTRAVENOUS | Status: DC | PRN

## 2017-04-27 MED ORDER — CYCLOBENZAPRINE HCL 5 MG PO TABS: 5.0000 mg | ORAL_TABLET | Freq: Three times a day (TID) | ORAL | 0 refills | Status: DC | PRN

## 2017-04-27 NOTE — OB Triage Note (Signed)
Pt presents c/o lower abdominal and back pain that started yesterday. Pt has been nauseous since yesterday. Pt rates her pain an 8/10. Describes her pain ans "achey, sharp, and cramping" based on whether she is moving or sitting still.  Pt reports positive fetal movement. Pt states she has been drinking plenty of fluids but has not ate today. Pt states her last date of intercourse was 3 days ago. . Denies any burning or hurting when she voids. Last BM was yesterday.

## 2017-04-27 NOTE — Final Progress Note (Addendum)
Physician Final Progress Note  Patient ID: Nancy Ball MRN: [id number] DOB/AGE: 1996-01-14 21 y.o.  Admit date: 04/27/2017 Admitting provider: Nadara Mustard, MD Discharge date: 04/27/2017   Admission Diagnoses:IUP at 26 weeks with lower abdominal pain and left sided back pain.  Discharge Diagnoses:  Active Problems:   Abdominal pain in pregnancy, second trimester Back pain. R/O muscular strain Bacterial vaginosis R/O UTI    Consults: None  Significant Findings/ Diagnostic Studies: 21 year old G3 P23 with EDC=08/03/2017 presented at 26 weeks with c/o lower abdominal pain and back pain that started yesterday evening. Also complains of some intermittent nausea that preceded the onset of pain. Lower abdominal pain in midline and back pain is sacral in location. The pain is constant and worsened by movement. It feels"achy". Baby active. No vaginal bleeding. Has an occasional contraction. No dysuria. +vaginal discharge. No vulvar itching. Has been carrying her 30 mos old daughter around on her left hip. Denies any recent trauma. Tylenol not helpful in decreasing pain. Heat did help her back. Prenatal care remarkable for close interconceptual spacing, asthma, and preeclampsia pp with G2. Hx of depression. PNC at [facility] also remarkable for late onset of care.  Social history: former smoker/ no alcohol  Review of Systems  Constitutional: Positive for chills (last night). Negative for fever.  HENT: Negative for sinus pain and sore throat.   Respiratory: Negative for cough and shortness of breath.   Gastrointestinal: Positive for abdominal pain and nausea. Negative for blood in stool, constipation, diarrhea and vomiting.  Genitourinary: Negative for dysuria, frequency and hematuria.  Musculoskeletal: Positive for back pain.  Skin: Negative for rash.    Exam: BP (!) 109/59 (BP Location: Right Arm)   Pulse 84   Temp 98.3 F (36.8 C) (Oral)   Resp 18   Ht 4\' 11"  (1.499 m)   Wt 68 kg  (150 lb)   LMP 11/03/2016   SpO2 100%   BMI 30.30 kg/m   General: BF in NAD, grunted when turned to side for exam. Tolerated eating a cheese burger and fries after arrival Heart: RRR without murmur Lungs: CTAB Abdomen: tenderness when palpating over lower uterine segment and the right and left lower uterine borders (pain when displacing the uterus to the right or left.), BS active, soft, no contractions palpated. FHR: 135 baseline with accelerations to 145 (10x10), moderate variability Toco: acontractile Back: mild tenderness over left SI joint;  +left CVAT, +/- right CVAT Vulva: no lesions, mild inflammation at introitus Vagina: thick, off white discharge with odor Cervix: baby dipping into pelvis/ closed/ 25-50%/ ballotable Wet prep positive for clue cells and bacteria and WBCs TNTC, negative for hyphae and Trich  Bedside ultrasound: LOP/ anterior placenta Results for orders placed or performed during the hospital encounter of 04/27/17 (from the past 24 hour(s))  Urinalysis, Complete w Microscopic     Status: Abnormal   Collection Time: 04/27/17  1:50 PM  Result Value Ref Range   Color, Urine YELLOW (A) YELLOW   APPearance HAZY (A) CLEAR   Specific Gravity, Urine 1.009 1.005 - 1.030   pH 6.0 5.0 - 8.0   Glucose, UA NEGATIVE NEGATIVE mg/dL   Hgb urine dipstick NEGATIVE NEGATIVE   Bilirubin Urine NEGATIVE NEGATIVE   Ketones, ur 20 (A) NEGATIVE mg/dL   Protein, ur NEGATIVE NEGATIVE mg/dL   Nitrite NEGATIVE NEGATIVE   Leukocytes, UA MODERATE (A) NEGATIVE   RBC / HPF 0-5 0 - 5 RBC/hpf   WBC, UA 0-5 0 -  5 WBC/hpf   Bacteria, UA RARE (A) NONE SEEN   Squamous Epithelial / LPF 0-5 (A) NONE SEEN   Mucous PRESENT   Chlamydia/NGC rt PCR ([facility] only)     Status: None   Collection Time: 04/27/17  1:50 PM  Result Value Ref Range   Specimen source GC/Chlam URINE, CLEAN CATCH    Chlamydia Tr NOT DETECTED NOT DETECTED   N gonorrhoeae NOT DETECTED NOT DETECTED   A: IUP at 26 weeks with  lower abdominal pain and back pain: probably musculoskeletal in etiology. R/O UTI Bacterial vaginosis  P: Discharge home Heat or ice to back Maternal support garment may be helpful Flexeril 5 mgm tid prn. Advised of sedative effects . Flagyl 500 mgm BID with food x 7 days. Urine culture pending ROB visit in 1 week as scheduled  Procedures: none  Discharge Condition: stable  Disposition: 01-Home or Self Care  Diet: Regular diet  Discharge Activity: No heavy lifting -avoid lifting daughter No driving after taking Flexeril  Discharge Instructions    Discharge patient    Complete by:  As directed    Discharge disposition:  01-Home or Self Care   Discharge patient date:  04/27/2017     Allergies as of 04/27/2017      Reactions   Shellfish Allergy Anaphylaxis   Excedrin Migraine [aspirin-acetaminophen-caffeine] Rash      Medication List    TAKE these medications   acetaminophen 325 MG tablet Commonly known as:  TYLENOL Take 325 mg by mouth every 4 (four) hours as needed. Reported on 06/23/2016   albuterol 108 (90 Base) MCG/ACT inhaler Commonly known as:  PROVENTIL HFA;VENTOLIN HFA Inhale into the lungs.   aspirin EC 81 MG tablet Take 1 tablet (81 mg total) by mouth daily. Take after 12 weeks for prevention of preeclampssia later in pregnancy   cyclobenzaprine 5 MG tablet Commonly known as:  FLEXERIL Take 1 tablet (5 mg total) by mouth 3 (three) times daily as needed for muscle spasms.   multivitamin-prenatal 27-0.8 MG Tabs tablet Take 1 tablet by mouth daily at 12 noon.   ondansetron 8 MG disintegrating tablet Commonly known as:  ZOFRAN ODT Take 1 tablet (8 mg total) by mouth every 8 (eight) hours as needed for nausea or vomiting.        Total time spent taking care of this patient: 20 minutes  Signed: Farrel Connersolleen Frieda Arnall 04/27/2017, 3:27 PM

## 2017-04-27 NOTE — Discharge Instructions (Signed)
Abdominal Pain During Pregnancy °Belly (abdominal) pain is common during pregnancy. Most of the time, it is not a serious problem. Other times, it can be a sign that something is wrong with the pregnancy. Always tell your doctor if you have belly pain. °Follow these instructions at home: °Monitor your belly pain for any changes. The following actions may help you feel better: °· Do not have sex (intercourse) or put anything in your vagina until you feel better. °· Rest until your pain stops. °· Drink clear fluids if you feel sick to your stomach (nauseous). Do not eat solid food until you feel better. °· Only take medicine as told by your doctor. °· Keep all doctor visits as told. °Get help right away if: °· You are bleeding, leaking fluid, or pieces of tissue come out of your vagina. °· You have more pain or cramping. °· You keep throwing up (vomiting). °· You have pain when you pee (urinate) or have blood in your pee. °· You have a fever. °· You do not feel your baby moving as much. °· You feel very weak or feel like passing out. °· You have trouble breathing, with or without belly pain. °· You have a very bad headache and belly pain. °· You have fluid leaking from your vagina and belly pain. °· You keep having watery poop (diarrhea). °· Your belly pain does not go away after resting, or the pain gets worse. °This information is not intended to replace advice given to you by your health care provider. Make sure you discuss any questions you have with your health care provider. °Document Released: 11/26/2009 Document Revised: 07/16/2016 Document Reviewed: 07/07/2013 °Elsevier Interactive Patient Education © 2017 [employer] ° °

## 2017-04-28 LAB — URINE CULTURE

## 2017-05-04 ENCOUNTER — Ambulatory Visit (INDEPENDENT_AMBULATORY_CARE_PROVIDER_SITE_OTHER): Payer: Medicaid Other | Admitting: Obstetrics and Gynecology

## 2017-05-04 ENCOUNTER — Other Ambulatory Visit: Payer: Medicaid Other

## 2017-05-04 VITALS — BP 110/60 | Wt 139.0 lb

## 2017-05-04 DIAGNOSIS — Z3A28 28 weeks gestation of pregnancy: Secondary | ICD-10-CM

## 2017-05-04 DIAGNOSIS — O0992 Supervision of high risk pregnancy, unspecified, second trimester: Secondary | ICD-10-CM

## 2017-05-04 DIAGNOSIS — Z131 Encounter for screening for diabetes mellitus: Secondary | ICD-10-CM

## 2017-05-04 DIAGNOSIS — Z113 Encounter for screening for infections with a predominantly sexual mode of transmission: Secondary | ICD-10-CM

## 2017-05-04 DIAGNOSIS — Z130 Encounter for screening for diseases of the blood and blood-forming organs and certain disorders involving the immune mechanism: Secondary | ICD-10-CM

## 2017-05-04 NOTE — Progress Notes (Signed)
Pos PNVs. NO VB, LOF. Cont to have LBP, Recommended stretching/ice/heat. Breast/IUD. 28 wk labs today. RTO in 2 wks.

## 2017-05-05 LAB — 28 WEEK RH+PANEL
BASOS ABS: 0 10*3/uL (ref 0.0–0.2)
BASOS: 0 %
EOS (ABSOLUTE): 0.1 10*3/uL (ref 0.0–0.4)
EOS: 2 %
Gestational Diabetes Screen: 116 mg/dL (ref 65–139)
HIV Screen 4th Generation wRfx: NONREACTIVE
Hematocrit: 29.4 % — ABNORMAL LOW (ref 34.0–46.6)
Hemoglobin: 9.7 g/dL — ABNORMAL LOW (ref 11.1–15.9)
IMMATURE GRANULOCYTES: 1 %
Immature Grans (Abs): 0.1 10*3/uL (ref 0.0–0.1)
LYMPHS ABS: 1.9 10*3/uL (ref 0.7–3.1)
Lymphs: 30 %
MCH: 27.7 pg (ref 26.6–33.0)
MCHC: 33 g/dL (ref 31.5–35.7)
MCV: 84 fL (ref 79–97)
MONOS ABS: 0.5 10*3/uL (ref 0.1–0.9)
Monocytes: 8 %
NEUTROS PCT: 59 %
Neutrophils Absolute: 3.8 10*3/uL (ref 1.4–7.0)
Platelets: 185 10*3/uL (ref 150–379)
RBC: 3.5 x10E6/uL — AB (ref 3.77–5.28)
RDW: 13.4 % (ref 12.3–15.4)
RPR: NONREACTIVE
WBC: 6.4 10*3/uL (ref 3.4–10.8)

## 2017-05-19 ENCOUNTER — Ambulatory Visit (INDEPENDENT_AMBULATORY_CARE_PROVIDER_SITE_OTHER): Payer: Medicaid Other | Admitting: Advanced Practice Midwife

## 2017-05-19 VITALS — BP 110/60 | Wt 142.0 lb

## 2017-05-19 DIAGNOSIS — Z3A29 29 weeks gestation of pregnancy: Secondary | ICD-10-CM

## 2017-05-19 DIAGNOSIS — Z23 Encounter for immunization: Secondary | ICD-10-CM | POA: Diagnosis not present

## 2017-05-19 NOTE — Addendum Note (Signed)
Addended by: Cornelius MorasPATTERSON, Elridge Stemm D on: 05/19/2017 02:44 PM   Modules accepted: Orders

## 2017-05-19 NOTE — Progress Notes (Signed)
Doing well. C/o hemorrhoids. Reviewed comfort measures and otc remedies. She has tried witch hazel with some success. No LOF, VB, Ctx's.

## 2017-05-21 ENCOUNTER — Observation Stay
Admission: EM | Admit: 2017-05-21 | Discharge: 2017-05-21 | Disposition: A | Discharge status: Home / Self Care | Payer: Medicaid Other | Attending: Obstetrics and Gynecology | Admitting: Obstetrics and Gynecology

## 2017-05-21 DIAGNOSIS — Z3A29 29 weeks gestation of pregnancy: Secondary | ICD-10-CM | POA: Diagnosis not present

## 2017-05-21 DIAGNOSIS — O4703 False labor before 37 completed weeks of gestation, third trimester: Principal | ICD-10-CM | POA: Insufficient documentation

## 2017-05-21 DIAGNOSIS — O26893 Other specified pregnancy related conditions, third trimester: Secondary | ICD-10-CM | POA: Diagnosis not present

## 2017-05-21 MED ORDER — ACETAMINOPHEN 325 MG PO TABS
650.0000 mg | ORAL_TABLET | ORAL | Status: DC | PRN
  Administered 2017-05-21: 650 mg via ORAL
  Filled 2017-05-21: qty 2

## 2017-05-21 NOTE — Discharge Summary (Signed)
Patient discharged with instructions on follow up appointment, labor precautions, pain management, and when to seek medical attention. Patient ambulatory at discharge with steady gait and no complaints. Patient verbalized understanding of discharge instructions. Patient discharged with significant other.

## 2017-05-21 NOTE — Final Progress Note (Signed)
Physician Final Progress Note  Patient ID: Nancy Ball MRN: [id number] DOB/AGE: [birth year] 21 y.o.  Admit date: 05/21/2017 Admitting provider: Vena Austria, MD Discharge date: 05/21/2017   Admission Diagnoses: Contractions  Discharge Diagnoses:  Active Problems:   Labor and delivery indication for care or intervention  21 year old G3P2002 at [redacted]w[redacted]d presenting with postcoital contractions.   Consults: None  Significant Findings/ Diagnostic Studies: none  Procedures:  Baseline: 130 Variability: moderate Accelerations: present Decelerations: absent Tocometry: one single contractions The patient was monitored for 30 minutes, fetal heart rate tracing was deemed reactive, category I tracing,   Discharge Condition: good  Disposition: 01-Home or Self Care  Diet: Regular diet  Discharge Activity: Activity as tolerated  Discharge Instructions    Discharge activity:  No Restrictions    Complete by:  As directed    Discharge diet:  No restrictions    Complete by:  As directed    No sexual activity restrictions    Complete by:  As directed    Notify physician for a general feeling that "something is not right"    Complete by:  As directed    Notify physician for increase or change in vaginal discharge    Complete by:  As directed    Notify physician for intestinal cramps, with or without diarrhea, sometimes described as "gas pain"    Complete by:  As directed    Notify physician for leaking of fluid    Complete by:  As directed    Notify physician for low, dull backache, unrelieved by heat or Tylenol    Complete by:  As directed    Notify physician for menstrual like cramps    Complete by:  As directed    Notify physician for pelvic pressure    Complete by:  As directed    Notify physician for uterine contractions.  These may be painless and feel like the uterus is tightening or the baby is  "balling up"    Complete by:  As directed    Notify physician for vaginal  bleeding    Complete by:  As directed    PRETERM LABOR:  Includes any of the follwing symptoms that occur between 20 - [redacted] weeks gestation.  If these symptoms are not stopped, preterm labor can result in preterm delivery, placing your baby at risk    Complete by:  As directed      Allergies as of 05/21/2017      Reactions   Shellfish Allergy Anaphylaxis   Excedrin Migraine [aspirin-acetaminophen-caffeine] Rash      Medication List    TAKE these medications   acetaminophen 325 MG tablet Commonly known as:  TYLENOL Take 325 mg by mouth every 4 (four) hours as needed. Reported on 06/23/2016   albuterol 108 (90 Base) MCG/ACT inhaler Commonly known as:  PROVENTIL HFA;VENTOLIN HFA Inhale into the lungs.   aspirin EC 81 MG tablet Take 1 tablet (81 mg total) by mouth daily. Take after 12 weeks for prevention of preeclampssia later in pregnancy   cyclobenzaprine 5 MG tablet Commonly known as:  FLEXERIL Take 1 tablet (5 mg total) by mouth 3 (three) times daily as needed for muscle spasms.   multivitamin-prenatal 27-0.8 MG Tabs tablet Take 1 tablet by mouth daily at 12 noon.   ondansetron 8 MG disintegrating tablet Commonly known as:  ZOFRAN ODT Take 1 tablet (8 mg total) by mouth every 8 (eight) hours as needed for nausea or vomiting.  Total time spent taking care of this patient: 10 minutes, patient triaged over phone NST reviewed  Signed: Vena Austriandreas Darcia Lampi 05/21/2017, 3:17 AM

## 2017-05-21 NOTE — OB Triage Note (Signed)
Patient came in for observation for labor evaluation. Patient reports irregular uterine contractions every fifteen minutes. Patient rate pain 8 out of 10. Patient denies leaking of fluid, denies vaginal bleeding and spotting. Vital signs stable and patient afebrile. FHR baseline 135 with moderate variability with accelerations 15 x 15 and no decelerations. Significant other at bedside. Will continue to monitor.

## 2017-05-21 NOTE — Discharge Summary (Signed)
See final progress note. 

## 2017-05-27 ENCOUNTER — Ambulatory Visit (INDEPENDENT_AMBULATORY_CARE_PROVIDER_SITE_OTHER): Payer: Medicaid Other | Admitting: Obstetrics & Gynecology

## 2017-05-27 VITALS — BP 100/70 | Wt 142.0 lb

## 2017-05-27 DIAGNOSIS — M545 Low back pain: Secondary | ICD-10-CM

## 2017-05-27 MED ORDER — CYCLOBENZAPRINE HCL 10 MG PO TABS: 10.0000 mg | ORAL_TABLET | Freq: Three times a day (TID) | ORAL | 1 refills | Status: DC | PRN

## 2017-05-27 NOTE — Progress Notes (Signed)
  Pt has low back pain worsening the last week severe today no radiation lying down helps and l;ifting hurts, no neuro changes, no bowel or bladder changes.  Flexeril nightly not helping.  Tylrnol abnd heat treied, aslo wears brace.    PMHx: She  has a past medical history of Anemia during pregnancy; Arthritis; Asthma; Depression; Hypertension; and Postpartum depression. Also,  has a past surgical history that includes Wisdom tooth extraction (Bilateral, 2013)., family history includes Cancer in her paternal aunt.,  reports that she quit smoking about 14 months ago. Her smoking use included Cigarettes. She has a 1.00 pack-year smoking history. She has never used smokeless tobacco. She reports that she does not drink alcohol or use drugs.  She has a current medication list which includes the following prescription(s): acetaminophen, albuterol, aspirin ec, cyclobenzaprine, cyclobenzaprine, ondansetron, and multivitamin-prenatal. Also, is allergic to shellfish allergy and excedrin migraine [aspirin-acetaminophen-caffeine].  Review of Systems  Constitutional: Negative for chills, fever and malaise/fatigue.  HENT: Negative for congestion, sinus pain and sore throat.   Eyes: Negative for blurred vision and pain.  Respiratory: Negative for cough and wheezing.   Cardiovascular: Negative for chest pain and leg swelling.  Gastrointestinal: Negative for abdominal pain, constipation, diarrhea, heartburn, nausea and vomiting.  Genitourinary: Negative for dysuria, frequency, hematuria and urgency.  Musculoskeletal: Negative for back pain, joint pain, myalgias and neck pain.  Skin: Negative for itching and rash.  Neurological: Negative for dizziness, tremors and weakness.  Endo/Heme/Allergies: Does not bruise/bleed easily.  Psychiatric/Behavioral: Negative for depression. The patient is not nervous/anxious and does not have insomnia.     Objective: BP 100/70   Wt 142 lb (64.4 kg)   LMP 11/03/2016   BMI  28.68 kg/m  Physical Exam  Constitutional: She is oriented to person, place, and time. She appears well-developed and well-nourished. No distress.  Musculoskeletal: Normal range of motion.  Neurological: She is alert and oriented to person, place, and time.  Skin: Skin is warm and dry.  Psychiatric: She has a normal mood and affect.  Vitals reviewed. Back- midline T, no CVAT Neuro- no LE deficits  ASSESSMENT/PLAN:   Problem List Items Addressed This Visit    None    Visit Diagnoses    Acute low back pain, unspecified back pain laterality, with sciatica presence unspecified    -  Primary   Relevant Medications   cyclobenzaprine (FLEXERIL) 10 MG tablet    Heat, rest, tylenol, keep prenatal appt  Annamarie MajorPaul Najai Waszak, MD, Merlinda FrederickFACOG [facility], [facility] 05/27/2017  2:11 PM

## 2017-05-27 NOTE — Patient Instructions (Signed)
Cyclobenzaprine tablets What is this medicine? CYCLOBENZAPRINE (sye kloe BEN za preen) is a muscle relaxer. It is used to treat muscle pain, spasms, and stiffness. This medicine may be used for other purposes; ask your health care provider or pharmacist if you have questions. COMMON BRAND NAME(S): Fexmid, Flexeril What should I tell my health care provider before I take this medicine? They need to know if you have any of these conditions: -heart disease, irregular heartbeat, or previous heart attack -liver disease -thyroid problem -an unusual or allergic reaction to cyclobenzaprine, tricyclic antidepressants, lactose, other medicines, foods, dyes, or preservatives -pregnant or trying to get pregnant -breast-feeding How should I use this medicine? Take this medicine by mouth with a glass of water. Follow the directions on the prescription label. If this medicine upsets your stomach, take it with food or milk. Take your medicine at regular intervals. Do not take it more often than directed. Talk to your pediatrician regarding the use of this medicine in children. Special care may be needed. Overdosage: If you think you have taken too much of this medicine contact a poison control center or emergency room at once. NOTE: This medicine is only for you. Do not share this medicine with others. What if I miss a dose? If you miss a dose, take it as soon as you can. If it is almost time for your next dose, take only that dose. Do not take double or extra doses. What may interact with this medicine? Do not take this medicine with any of the following medications: -certain medicines for fungal infections like fluconazole, itraconazole, ketoconazole, posaconazole, voriconazole -cisapride -dofetilide -dronedarone -halofantrine -levomethadyl -MAOIs like Carbex, Eldepryl, Marplan, Nardil, and Parnate -narcotic medicines for cough -pimozide -thioridazine -ziprasidone This medicine may also interact  with the following medications: -alcohol -antihistamines for allergy, cough and cold -certain medicines for anxiety or sleep -certain medicines for cancer -certain medicines for depression like amitriptyline, fluoxetine, sertraline -certain medicines for infection like alfuzosin, chloroquine, clarithromycin, levofloxacin, mefloquine, pentamidine, troleandomycin -certain medicines for irregular heart beat -certain medicines for seizures like phenobarbital, primidone -contrast dyes -general anesthetics like halothane, isoflurane, methoxyflurane, propofol -local anesthetics like lidocaine, pramoxine, tetracaine -medicines that relax muscles for surgery -narcotic medicines for pain -other medicines that prolong the QT interval (cause an abnormal heart rhythm) -phenothiazines like chlorpromazine, mesoridazine, prochlorperazine This list may not describe all possible interactions. Give your health care provider a list of all the medicines, herbs, non-prescription drugs, or dietary supplements you use. Also tell them if you smoke, drink alcohol, or use illegal drugs. Some items may interact with your medicine. What should I watch for while using this medicine? Tell your doctor or health care professional if your symptoms do not start to get better or if they get worse. You may get drowsy or dizzy. Do not drive, use machinery, or do anything that needs mental alertness until you know how this medicine affects you. Do not stand or sit up quickly, especially if you are an older patient. This reduces the risk of dizzy or fainting spells. Alcohol may interfere with the effect of this medicine. Avoid alcoholic drinks. If you are taking another medicine that also causes drowsiness, you may have more side effects. Give your health care provider a list of all medicines you use. Your doctor will tell you how much medicine to take. Do not take more medicine than directed. Call emergency for help if you have  problems breathing or unusual sleepiness. Your mouth may get dry. Chewing   sugarless gum or sucking hard candy, and drinking plenty of water may help. Contact your doctor if the problem does not go away or is severe. What side effects may I notice from receiving this medicine? Side effects that you should report to your doctor or health care professional as soon as possible: -allergic reactions like skin rash, itching or hives, swelling of the face, lips, or tongue -breathing problems -chest pain -fast, irregular heartbeat -hallucinations -seizures -unusually weak or tired Side effects that usually do not require medical attention (report to your doctor or health care professional if they continue or are bothersome): -headache -nausea, vomiting This list may not describe all possible side effects. Call your doctor for medical advice about side effects. You may report side effects to FDA at 1-800-FDA-1088. Where should I keep my medicine? Keep out of the reach of children. Store at room temperature between 15 and 30 degrees C (59 and 86 degrees F). Keep container tightly closed. Throw away any unused medicine after the expiration date. NOTE: This sheet is a summary. It may not cover all possible information. If you have questions about this medicine, talk to your doctor, pharmacist, or health care provider.  2018 Elsevier/Gold Standard (2015-09-18 12:05:46)  

## 2017-06-03 ENCOUNTER — Ambulatory Visit (INDEPENDENT_AMBULATORY_CARE_PROVIDER_SITE_OTHER): Payer: Medicaid Other | Admitting: Obstetrics and Gynecology

## 2017-06-03 VITALS — BP 104/70 | Wt 142.0 lb

## 2017-06-03 DIAGNOSIS — Z8759 Personal history of other complications of pregnancy, childbirth and the puerperium: Secondary | ICD-10-CM

## 2017-06-03 DIAGNOSIS — M549 Dorsalgia, unspecified: Secondary | ICD-10-CM

## 2017-06-03 DIAGNOSIS — O0992 Supervision of high risk pregnancy, unspecified, second trimester: Secondary | ICD-10-CM

## 2017-06-03 DIAGNOSIS — O99013 Anemia complicating pregnancy, third trimester: Secondary | ICD-10-CM | POA: Insufficient documentation

## 2017-06-03 DIAGNOSIS — Z3A31 31 weeks gestation of pregnancy: Secondary | ICD-10-CM

## 2017-06-03 DIAGNOSIS — O9989 Other specified diseases and conditions complicating pregnancy, childbirth and the puerperium: Secondary | ICD-10-CM

## 2017-06-03 DIAGNOSIS — J4520 Mild intermittent asthma, uncomplicated: Secondary | ICD-10-CM

## 2017-06-03 DIAGNOSIS — O26892 Other specified pregnancy related conditions, second trimester: Secondary | ICD-10-CM

## 2017-06-03 HISTORY — DX: Anemia complicating pregnancy, third trimester: O99.013

## 2017-06-03 NOTE — Progress Notes (Signed)
Pt having painful contractions. States that she has had none today and these are quite sporadic and short-lived. No vb. No lof. Improved back symptoms, but not resolved.  Denies VB and LOF. Iron sample provided for anemia.

## 2017-06-06 ENCOUNTER — Observation Stay
Admission: EM | Admit: 2017-06-06 | Discharge: 2017-06-06 | Disposition: A | Discharge status: Home / Self Care | Payer: Medicaid Other | Attending: Obstetrics & Gynecology | Admitting: Obstetrics & Gynecology

## 2017-06-06 ENCOUNTER — Encounter: Payer: Self-pay | Admitting: *Deleted

## 2017-06-06 DIAGNOSIS — Z91013 Allergy to seafood: Secondary | ICD-10-CM | POA: Diagnosis not present

## 2017-06-06 DIAGNOSIS — O26893 Other specified pregnancy related conditions, third trimester: Principal | ICD-10-CM | POA: Insufficient documentation

## 2017-06-06 DIAGNOSIS — Z79899 Other long term (current) drug therapy: Secondary | ICD-10-CM | POA: Diagnosis not present

## 2017-06-06 DIAGNOSIS — R51 Headache: Secondary | ICD-10-CM | POA: Diagnosis present

## 2017-06-06 DIAGNOSIS — O99013 Anemia complicating pregnancy, third trimester: Secondary | ICD-10-CM

## 2017-06-06 DIAGNOSIS — Z3A31 31 weeks gestation of pregnancy: Secondary | ICD-10-CM | POA: Diagnosis not present

## 2017-06-06 DIAGNOSIS — R609 Edema, unspecified: Secondary | ICD-10-CM | POA: Diagnosis not present

## 2017-06-06 DIAGNOSIS — Z3490 Encounter for supervision of normal pregnancy, unspecified, unspecified trimester: Secondary | ICD-10-CM

## 2017-06-06 NOTE — Discharge Summary (Signed)
Physician Discharge Summary  Patient ID: Nancy Ball MRN: [id number] DOB/AGE: 21/12/1995 20 y.o.  Admit date: 06/06/2017 Discharge date: 06/06/2017  Admission Diagnoses: Headache, Edema, Pregnancy 31 weeks  Discharge Diagnoses: same  Discharged Condition: good  Hospital Course: Patient presented for evaluation of labor.  Patient had cervical exam by RN and this was reported to me. I reviewed her vital signs and fetal tracing, both of which were reassuring.  Patient was discharge as she was not laboring. Also seen for headache and edema.  Edema mild.  Normal blood pressures.  Plan Tylenol and hydration.  Consults: None  Significant Diagnostic Studies: Good FHTs and no ctxs.  Treatments: none  Discharge Exam: Blood pressure (!) 113/57, pulse 87, temperature 98.5 F (36.9 C), temperature source Oral, resp. rate 14, height 4\' 11"  (1.499 m), weight 146 lb (66.2 kg), last menstrual period 11/03/2016, not currently breastfeeding. General appearance: alert and cooperative Seen by triage.  Disposition: 01-Home or Self Care   Allergies as of 06/06/2017      Reactions   Shellfish Allergy Anaphylaxis   Excedrin Migraine [aspirin-acetaminophen-caffeine] Rash      Medication List    TAKE these medications   acetaminophen 325 MG tablet Commonly known as:  TYLENOL Take 325 mg by mouth every 4 (four) hours as needed. Reported on 06/23/2016   albuterol 108 (90 Base) MCG/ACT inhaler Commonly known as:  PROVENTIL HFA;VENTOLIN HFA Inhale into the lungs.   aspirin EC 81 MG tablet Take 1 tablet (81 mg total) by mouth daily. Take after 12 weeks for prevention of preeclampssia later in pregnancy   cyclobenzaprine 5 MG tablet Commonly known as:  FLEXERIL Take 1 tablet (5 mg total) by mouth 3 (three) times daily as needed for muscle spasms.   cyclobenzaprine 10 MG tablet Commonly known as:  FLEXERIL Take 1 tablet (10 mg total) by mouth every 8 (eight) hours as needed for muscle spasms.    multivitamin-prenatal 27-0.8 MG Tabs tablet Take 1 tablet by mouth daily at 12 noon.   ondansetron 8 MG disintegrating tablet Commonly known as:  ZOFRAN ODT Take 1 tablet (8 mg total) by mouth every 8 (eight) hours as needed for nausea or vomiting.        Signed: Letitia Libraobert Paul Braniyah Besse 06/06/2017, 6:13 PM

## 2017-06-06 NOTE — Discharge Instructions (Signed)
Please keep your next scheduled appointment.  If you have any questions or concerns you may call the on call provider through the office answering service.  You may also call the [facility] Nurse's desk at [phone].  If you have urgent concerns or needs please go to the nearest emergency room for further evaluation.

## 2017-06-18 ENCOUNTER — Ambulatory Visit (INDEPENDENT_AMBULATORY_CARE_PROVIDER_SITE_OTHER): Payer: Medicaid Other | Admitting: Advanced Practice Midwife

## 2017-06-18 VITALS — BP 106/74 | Wt 148.0 lb

## 2017-06-18 DIAGNOSIS — Z3A33 33 weeks gestation of pregnancy: Secondary | ICD-10-CM

## 2017-06-18 NOTE — Progress Notes (Signed)
No vb no lof.  

## 2017-06-18 NOTE — Progress Notes (Signed)
Back pain is not as bad as it has been. Still having about 5-6 painful contractions per day. Reminded to stay hydrated. No LOF, VB. Taking Fe as directed but still feeling tired. Encouraged to continue supplement and to nap as needed.

## 2017-06-20 ENCOUNTER — Observation Stay
Admission: EM | Admit: 2017-06-20 | Discharge: 2017-06-21 | Disposition: A | Discharge status: Home / Self Care | Payer: Medicaid Other | Attending: Obstetrics and Gynecology | Admitting: Obstetrics and Gynecology

## 2017-06-20 DIAGNOSIS — Z87891 Personal history of nicotine dependence: Secondary | ICD-10-CM | POA: Insufficient documentation

## 2017-06-20 DIAGNOSIS — F329 Major depressive disorder, single episode, unspecified: Secondary | ICD-10-CM | POA: Insufficient documentation

## 2017-06-20 DIAGNOSIS — Z91013 Allergy to seafood: Secondary | ICD-10-CM | POA: Insufficient documentation

## 2017-06-20 DIAGNOSIS — Z79899 Other long term (current) drug therapy: Secondary | ICD-10-CM | POA: Insufficient documentation

## 2017-06-20 DIAGNOSIS — M0890 Juvenile arthritis, unspecified, unspecified site: Secondary | ICD-10-CM | POA: Insufficient documentation

## 2017-06-20 DIAGNOSIS — O4703 False labor before 37 completed weeks of gestation, third trimester: Principal | ICD-10-CM | POA: Insufficient documentation

## 2017-06-20 DIAGNOSIS — O9989 Other specified diseases and conditions complicating pregnancy, childbirth and the puerperium: Secondary | ICD-10-CM | POA: Insufficient documentation

## 2017-06-20 DIAGNOSIS — O99343 Other mental disorders complicating pregnancy, third trimester: Secondary | ICD-10-CM | POA: Insufficient documentation

## 2017-06-20 DIAGNOSIS — Z886 Allergy status to analgesic agent status: Secondary | ICD-10-CM | POA: Insufficient documentation

## 2017-06-20 DIAGNOSIS — J45909 Unspecified asthma, uncomplicated: Secondary | ICD-10-CM | POA: Insufficient documentation

## 2017-06-20 DIAGNOSIS — O99513 Diseases of the respiratory system complicating pregnancy, third trimester: Secondary | ICD-10-CM | POA: Insufficient documentation

## 2017-06-20 DIAGNOSIS — Z3A33 33 weeks gestation of pregnancy: Secondary | ICD-10-CM | POA: Insufficient documentation

## 2017-06-20 DIAGNOSIS — Z7982 Long term (current) use of aspirin: Secondary | ICD-10-CM | POA: Insufficient documentation

## 2017-06-21 DIAGNOSIS — Z7982 Long term (current) use of aspirin: Secondary | ICD-10-CM | POA: Diagnosis not present

## 2017-06-21 DIAGNOSIS — O99513 Diseases of the respiratory system complicating pregnancy, third trimester: Secondary | ICD-10-CM | POA: Diagnosis not present

## 2017-06-21 DIAGNOSIS — F329 Major depressive disorder, single episode, unspecified: Secondary | ICD-10-CM | POA: Diagnosis not present

## 2017-06-21 DIAGNOSIS — Z91013 Allergy to seafood: Secondary | ICD-10-CM | POA: Diagnosis not present

## 2017-06-21 DIAGNOSIS — Z79899 Other long term (current) drug therapy: Secondary | ICD-10-CM | POA: Diagnosis not present

## 2017-06-21 DIAGNOSIS — Z886 Allergy status to analgesic agent status: Secondary | ICD-10-CM | POA: Diagnosis not present

## 2017-06-21 DIAGNOSIS — O99343 Other mental disorders complicating pregnancy, third trimester: Secondary | ICD-10-CM | POA: Diagnosis not present

## 2017-06-21 DIAGNOSIS — O4703 False labor before 37 completed weeks of gestation, third trimester: Secondary | ICD-10-CM

## 2017-06-21 DIAGNOSIS — Z3A33 33 weeks gestation of pregnancy: Secondary | ICD-10-CM | POA: Diagnosis not present

## 2017-06-21 DIAGNOSIS — O9989 Other specified diseases and conditions complicating pregnancy, childbirth and the puerperium: Secondary | ICD-10-CM | POA: Diagnosis present

## 2017-06-21 DIAGNOSIS — J45909 Unspecified asthma, uncomplicated: Secondary | ICD-10-CM | POA: Diagnosis not present

## 2017-06-21 DIAGNOSIS — Z87891 Personal history of nicotine dependence: Secondary | ICD-10-CM | POA: Diagnosis not present

## 2017-06-21 DIAGNOSIS — M0890 Juvenile arthritis, unspecified, unspecified site: Secondary | ICD-10-CM | POA: Diagnosis not present

## 2017-06-21 LAB — URINALYSIS, COMPLETE (UACMP) WITH MICROSCOPIC
BACTERIA UA: NONE SEEN
Bilirubin Urine: NEGATIVE
GLUCOSE, UA: NEGATIVE mg/dL
Hgb urine dipstick: NEGATIVE
KETONES UR: NEGATIVE mg/dL
Leukocytes, UA: NEGATIVE
NITRITE: NEGATIVE
PROTEIN: NEGATIVE mg/dL
Specific Gravity, Urine: 1.013 (ref 1.005–1.030)
pH: 6 (ref 5.0–8.0)

## 2017-06-21 LAB — CHLAMYDIA/NGC RT PCR (ARMC ONLY)
CHLAMYDIA TR: NOT DETECTED
N GONORRHOEAE: NOT DETECTED

## 2017-06-21 LAB — WET PREP, GENITAL
CLUE CELLS WET PREP: NONE SEEN
Sperm: NONE SEEN
Trich, Wet Prep: NONE SEEN
Yeast Wet Prep HPF POC: NONE SEEN

## 2017-06-21 NOTE — OB Triage Note (Signed)
Patient came in for observation for labor evaluation. Patient reports irregular uterine contractions every ten minutes. Patient rate [place] 10 out of 10. Patient denies leaking  of fluid, denies vaginal bleeding and spotting. Patient states she has been feeling the baby move fine. Vital signs stable and patient afebrile. FHR baseline 130 with moderate variability with accelerations 15 x 15 and no decelerations. Significant other at bedside. Will continue to monitor.

## 2017-06-21 NOTE — Discharge Summary (Signed)
Patient discharged with instructions on follow up appointments, labor precautions, pain management, fetal kick count, and when to seek medical attention. Patient verbalized understanding of discharge instructions. Patient discharged in wheelchair back to Emergency Room lobby with her husband. Patient was stable at discharge.

## 2017-06-21 NOTE — Final Progress Note (Signed)
Physician Final Progress Note  Patient ID: Nancy Ball MRN: [id number] DOB/AGE: 07/15/1996 21 y.o.  Admit date: 06/20/2017 Admitting provider: Conard Novak, MD Discharge date: 06/21/2017   Admission Diagnoses:  1) intrauterine pregnancy at [redacted]w[redacted]d  2) abdominal pain  Discharge Diagnoses:  1) intrauterine pregnancy at [redacted]w[redacted]d  2) abdominal pain, false labor   History of Present Illness: The patient is a 21 y.o. female G3P2002 at [redacted]w[redacted]d who presents for regular uterine contractions. The contractions began at 7pm last night and become stronger and closer together.  They were 5 minutes apart when she decided to come in.  She denies vaginal bleeding, leakage of fluid. She notes +FM. She tried relaxing, which did not help. She was moving around a lot today, but tried to stay hydrated.   She denies urinary symptoms, vaginal symptoms. Denies fevers, chills, nausea, emesis.   Hospital Course: She was admitted to labor and delivery observation.  She had reassuring fetal testing.  Her cervix was closed.  Her testing showed no evidence of bladder infection and no vaginitis.  Her vitals were normal and stable. She was discharged to home in stable condition.    Past Medical History:  Diagnosis Date  . Anemia during pregnancy   . 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 Asthma    well controlled, last episode in high school with running track  . Depression    PP depression, on zoloft presently  . Hypertension   . Postpartum depression     Past Surgical History:  Procedure Laterality Date  . WISDOM TOOTH EXTRACTION Bilateral 2013   states she was put to sleep    No current facility-administered medications on file prior to encounter.    Current Outpatient Prescriptions on File Prior to Encounter  Medication Sig Dispense Refill  . acetaminophen (TYLENOL) 325 MG tablet Take 325 mg by mouth every 4 (four) hours as needed. Reported on  06/23/2016    . cyclobenzaprine (FLEXERIL) 10 MG tablet Take 1 tablet (10 mg total) by mouth every 8 (eight) hours as needed for muscle spasms. 30 tablet 1  . Prenatal Vit-Fe Fumarate-FA (MULTIVITAMIN-PRENATAL) 27-0.8 MG TABS tablet Take 1 tablet by mouth daily at 12 noon.    Marland Kitchen albuterol (PROVENTIL HFA;VENTOLIN HFA) 108 (90 Base) MCG/ACT inhaler Inhale into the lungs.    Marland Kitchen aspirin EC 81 MG tablet Take 1 tablet (81 mg total) by mouth daily. Take after 12 weeks for prevention of preeclampssia later in pregnancy (Patient not taking: Reported on 06/06/2017) 90 tablet 1  . cyclobenzaprine (FLEXERIL) 5 MG tablet Take 1 tablet (5 mg total) by mouth 3 (three) times daily as needed for muscle spasms. (Patient not taking: Reported on 06/06/2017) 30 tablet 0  . ondansetron (ZOFRAN ODT) 8 MG disintegrating tablet Take 1 tablet (8 mg total) by mouth every 8 (eight) hours as needed for nausea or vomiting. (Patient not taking: Reported on 04/27/2017) 20 tablet 0    Allergies  Allergen Reactions  . Shellfish Allergy Anaphylaxis  . Excedrin Migraine [Aspirin-Acetaminophen-Caffeine] Rash    Social History   Social History  . Marital status: Married    Spouse name: N/A  . Number of children: N/A  . Years of education: N/A   Occupational History  . Not on file.   Social History Main Topics  . Smoking status: Former Smoker    Packs/day: 0.50    Years: 2.00    Types: Cigarettes  Quit date: 12/05/2016  . Smokeless tobacco: Never Used  . Alcohol use No  . Drug use: No  . Sexual activity: Yes   Other Topics Concern  . Not on file   Social History Narrative  . No narrative on file    Physical Exam: Ht 4\' 11"  (1.499 m)   Wt 148 lb (67.1 kg)   LMP 11/03/2016   BMI 29.89 kg/m   Physical Exam  Constitutional: She is oriented to person, place, and time. She appears well-developed and well-nourished. No distress.  HENT:  Head: Normocephalic and atraumatic.  Eyes: Conjunctivae are normal.  Neck:  Normal range of motion. Neck supple.  Cardiovascular: Normal rate, regular rhythm and normal heart sounds.   Pulmonary/Chest: Effort normal and breath sounds normal.  Abdominal: Soft. Hernia confirmed negative in the right inguinal area and confirmed negative in the left inguinal area.  Gravid, NT  Genitourinary: Pelvic exam was performed with patient supine. There is no rash, tenderness or lesion on the right labia. There is no rash, tenderness or lesion on the left labia.  Genitourinary Comments: SSE: normal BUS, vaginal normal without lesions, cervix is without lesions. SVE: cvx: closed, thick, high  Musculoskeletal: Normal range of motion.  Lymphadenopathy:       Right: No inguinal adenopathy present.       Left: No inguinal adenopathy present.  Neurological: She is alert and oriented to person, place, and time.  Skin: Skin is warm and dry. No rash noted.  Psychiatric: She has a normal mood and affect. Her behavior is normal. Judgment normal.    Female chaperone present for pelvic exam.   Consults: None  Significant Findings/ Diagnostic Studies: u/a negative, wet prep negative, GC/CT- pending at discharge.   Procedures: NST Baseline FHR: 130 beats/min Variability: moderate Accelerations: present Decelerations: absent Tocometry: 1-2 q 10 min  Interpretation:  INDICATIONS: rule out uterine contractions RESULTS:  A NST procedure was performed with FHR monitoring and a normal baseline established, appropriate time of 20-40 minutes of evaluation, and accels >2 seen w 15x15 characteristics.  Results show a REACTIVE NST.    Discharge Condition: stable  Disposition: 01-Home or Self Care  Diet: Regular diet  Discharge Activity: Activity as tolerated   Allergies as of 06/21/2017      Reactions   Shellfish Allergy Anaphylaxis   Excedrin Migraine [aspirin-acetaminophen-caffeine] Rash      Medication List    STOP taking these medications   aspirin EC 81 MG tablet    ondansetron 8 MG disintegrating tablet Commonly known as:  ZOFRAN ODT     TAKE these medications   acetaminophen 325 MG tablet Commonly known as:  TYLENOL Take 325 mg by mouth every 4 (four) hours as needed. Reported on 06/23/2016   albuterol 108 (90 Base) MCG/ACT inhaler Commonly known as:  PROVENTIL HFA;VENTOLIN HFA Inhale into the lungs.   cyclobenzaprine 10 MG tablet Commonly known as:  FLEXERIL Take 1 tablet (10 mg total) by mouth every 8 (eight) hours as needed for muscle spasms. What changed:  Another medication with the same name was removed. Continue taking this medication, and follow the directions you see here.   multivitamin-prenatal 27-0.8 MG Tabs tablet Take 1 tablet by mouth daily at 12 noon.      Follow-up Information    [facility] 2 week(s).   Why:  Keep previously scheduled appointment Contact information: [address] [place] [phone]  Total time spent taking care of this patient: 30 minutes  Signed: Thomasene Mohair, MD  06/21/2017, 3:20 AM

## 2017-07-02 ENCOUNTER — Ambulatory Visit (INDEPENDENT_AMBULATORY_CARE_PROVIDER_SITE_OTHER): Payer: Medicaid Other | Admitting: Advanced Practice Midwife

## 2017-07-02 VITALS — BP 114/70 | Wt 149.0 lb

## 2017-07-02 DIAGNOSIS — Z3A35 35 weeks gestation of pregnancy: Secondary | ICD-10-CM

## 2017-07-02 NOTE — Progress Notes (Signed)
No vb. No lof.  

## 2017-07-02 NOTE — Progress Notes (Signed)
Doing better with hydration. Taking Fe and feeling a little more energy. Occasional contractions but nothing regular. No LOF, VB. Labor precautions reviewed.

## 2017-07-10 ENCOUNTER — Ambulatory Visit (INDEPENDENT_AMBULATORY_CARE_PROVIDER_SITE_OTHER): Payer: Medicaid Other | Admitting: Advanced Practice Midwife

## 2017-07-10 VITALS — BP 118/74 | Wt 150.0 lb

## 2017-07-10 DIAGNOSIS — Z113 Encounter for screening for infections with a predominantly sexual mode of transmission: Secondary | ICD-10-CM

## 2017-07-10 DIAGNOSIS — Z3A36 36 weeks gestation of pregnancy: Secondary | ICD-10-CM

## 2017-07-10 DIAGNOSIS — Z3685 Encounter for antenatal screening for Streptococcus B: Secondary | ICD-10-CM

## 2017-07-10 NOTE — Progress Notes (Signed)
Having more frequent contractions but nothing closer than every 15 minutes. Good fetal movement. No LOF, VB. Labor precautions reviewed. GBS/aptima today. Cervix is visually .5 to 1 cm

## 2017-07-10 NOTE — Progress Notes (Signed)
GBS/Aptiama today

## 2017-07-12 LAB — STREP GP B NAA: Strep Gp B NAA: NEGATIVE

## 2017-07-15 LAB — GC/CHLAMYDIA PROBE AMP
Chlamydia trachomatis, NAA: NEGATIVE
Neisseria gonorrhoeae by PCR: NEGATIVE

## 2017-07-17 ENCOUNTER — Ambulatory Visit (INDEPENDENT_AMBULATORY_CARE_PROVIDER_SITE_OTHER): Payer: Medicaid Other | Admitting: Obstetrics and Gynecology

## 2017-07-17 VITALS — BP 122/62 | Wt 149.0 lb

## 2017-07-17 DIAGNOSIS — Z8759 Personal history of other complications of pregnancy, childbirth and the puerperium: Secondary | ICD-10-CM

## 2017-07-17 DIAGNOSIS — O99013 Anemia complicating pregnancy, third trimester: Secondary | ICD-10-CM

## 2017-07-17 DIAGNOSIS — Z3A37 37 weeks gestation of pregnancy: Secondary | ICD-10-CM

## 2017-07-17 DIAGNOSIS — O0992 Supervision of high risk pregnancy, unspecified, second trimester: Secondary | ICD-10-CM

## 2017-07-17 NOTE — Progress Notes (Signed)
Leaking fluid/wet panties Hurting in legs/feet

## 2017-07-17 NOTE — Progress Notes (Signed)
Negative nitrazine

## 2017-07-20 ENCOUNTER — Other Ambulatory Visit: Payer: Self-pay | Admitting: Obstetrics & Gynecology

## 2017-07-22 NOTE — Telephone Encounter (Signed)
Please advise on refill.

## 2017-07-22 NOTE — Telephone Encounter (Signed)
Pt has an appointment w/[facility] on 8/3. Will refill at that time.

## 2017-07-24 ENCOUNTER — Encounter: Payer: Medicaid Other | Admitting: Obstetrics and Gynecology

## 2017-07-29 ENCOUNTER — Inpatient Hospital Stay: Payer: Medicaid Other | Admitting: Anesthesiology

## 2017-07-29 ENCOUNTER — Encounter: Payer: Self-pay | Admitting: *Deleted

## 2017-07-29 ENCOUNTER — Inpatient Hospital Stay
Admission: EM | Admit: 2017-07-29 | Discharge: 2017-07-31 | DRG: 775 | Disposition: A | Discharge status: Home / Self Care | Payer: Medicaid Other | Attending: Obstetrics and Gynecology | Admitting: Obstetrics and Gynecology

## 2017-07-29 DIAGNOSIS — Z3A39 39 weeks gestation of pregnancy: Secondary | ICD-10-CM

## 2017-07-29 DIAGNOSIS — O99013 Anemia complicating pregnancy, third trimester: Secondary | ICD-10-CM | POA: Diagnosis present

## 2017-07-29 DIAGNOSIS — D62 Acute posthemorrhagic anemia: Secondary | ICD-10-CM | POA: Diagnosis not present

## 2017-07-29 DIAGNOSIS — O9962 Diseases of the digestive system complicating childbirth: Secondary | ICD-10-CM | POA: Diagnosis present

## 2017-07-29 DIAGNOSIS — Z87891 Personal history of nicotine dependence: Secondary | ICD-10-CM | POA: Diagnosis not present

## 2017-07-29 DIAGNOSIS — O9081 Anemia of the puerperium: Secondary | ICD-10-CM | POA: Diagnosis not present

## 2017-07-29 DIAGNOSIS — O99344 Other mental disorders complicating childbirth: Secondary | ICD-10-CM | POA: Diagnosis present

## 2017-07-29 DIAGNOSIS — O0992 Supervision of high risk pregnancy, unspecified, second trimester: Secondary | ICD-10-CM

## 2017-07-29 DIAGNOSIS — F329 Major depressive disorder, single episode, unspecified: Secondary | ICD-10-CM | POA: Diagnosis present

## 2017-07-29 DIAGNOSIS — K529 Noninfective gastroenteritis and colitis, unspecified: Secondary | ICD-10-CM | POA: Diagnosis present

## 2017-07-29 DIAGNOSIS — J45909 Unspecified asthma, uncomplicated: Secondary | ICD-10-CM | POA: Diagnosis present

## 2017-07-29 DIAGNOSIS — Z8759 Personal history of other complications of pregnancy, childbirth and the puerperium: Secondary | ICD-10-CM

## 2017-07-29 DIAGNOSIS — Z3490 Encounter for supervision of normal pregnancy, unspecified, unspecified trimester: Secondary | ICD-10-CM

## 2017-07-29 DIAGNOSIS — Z3493 Encounter for supervision of normal pregnancy, unspecified, third trimester: Secondary | ICD-10-CM | POA: Diagnosis present

## 2017-07-29 LAB — COMPREHENSIVE METABOLIC PANEL
ALT: 9 U/L — AB (ref 14–54)
AST: 18 U/L (ref 15–41)
Albumin: 3.1 g/dL — ABNORMAL LOW (ref 3.5–5.0)
Alkaline Phosphatase: 112 U/L (ref 38–126)
Anion gap: 7 (ref 5–15)
BILIRUBIN TOTAL: 0.5 mg/dL (ref 0.3–1.2)
BUN: 6 mg/dL (ref 6–20)
CHLORIDE: 110 mmol/L (ref 101–111)
CO2: 22 mmol/L (ref 22–32)
Calcium: 8.6 mg/dL — ABNORMAL LOW (ref 8.9–10.3)
Creatinine, Ser: 0.56 mg/dL (ref 0.44–1.00)
Glucose, Bld: 84 mg/dL (ref 65–99)
POTASSIUM: 3.2 mmol/L — AB (ref 3.5–5.1)
Sodium: 139 mmol/L (ref 135–145)
Total Protein: 6.4 g/dL — ABNORMAL LOW (ref 6.5–8.1)

## 2017-07-29 LAB — CBC
HEMATOCRIT: 29.3 % — AB (ref 35.0–47.0)
HEMOGLOBIN: 9.7 g/dL — AB (ref 12.0–16.0)
MCH: 24.6 pg — ABNORMAL LOW (ref 26.0–34.0)
MCHC: 33.2 g/dL (ref 32.0–36.0)
MCV: 74.1 fL — AB (ref 80.0–100.0)
Platelets: 220 10*3/uL (ref 150–440)
RBC: 3.96 MIL/uL (ref 3.80–5.20)
RDW: 14.5 % (ref 11.5–14.5)
WBC: 8.9 10*3/uL (ref 3.6–11.0)

## 2017-07-29 LAB — LIPASE, BLOOD: LIPASE: 28 U/L (ref 11–51)

## 2017-07-29 LAB — TYPE AND SCREEN
ABO/RH(D): B POS
Antibody Screen: NEGATIVE

## 2017-07-29 MED ORDER — LIDOCAINE-EPINEPHRINE (PF) 1.5 %-1:200000 IJ SOLN
INTRAMUSCULAR | Status: DC | PRN
  Administered 2017-07-29: 3 mL via PERINEURAL

## 2017-07-29 MED ORDER — LACTATED RINGERS IV SOLN: 500.0000 mL | Freq: Once | INTRAVENOUS | Status: DC

## 2017-07-29 MED ORDER — WITCH HAZEL-GLYCERIN EX PADS: 1.0000 "application " | MEDICATED_PAD | CUTANEOUS | Status: DC | PRN

## 2017-07-29 MED ORDER — PRENATAL MULTIVITAMIN CH
1.0000 | ORAL_TABLET | Freq: Every day | ORAL | Status: DC
  Administered 2017-07-30 – 2017-07-31 (×2): 1 via ORAL
  Filled 2017-07-29 (×2): qty 1

## 2017-07-29 MED ORDER — COCONUT OIL OIL: 1.0000 "application " | TOPICAL_OIL | Status: DC | PRN

## 2017-07-29 MED ORDER — HYDROCODONE-ACETAMINOPHEN 5-325 MG PO TABS
1.0000 | ORAL_TABLET | Freq: Four times a day (QID) | ORAL | Status: DC | PRN
  Administered 2017-07-30 – 2017-07-31 (×5): 1 via ORAL
  Filled 2017-07-29 (×5): qty 1

## 2017-07-29 MED ORDER — SIMETHICONE 80 MG PO CHEW: 80.0000 mg | CHEWABLE_TABLET | ORAL | Status: DC | PRN

## 2017-07-29 MED ORDER — TERBUTALINE SULFATE 1 MG/ML IJ SOLN: 0.2500 mg | Freq: Once | INTRAMUSCULAR | Status: DC | PRN

## 2017-07-29 MED ORDER — ONDANSETRON 4 MG PO TBDP
8.0000 mg | ORAL_TABLET | Freq: Once | ORAL | Status: AC
  Administered 2017-07-29: 8 mg via ORAL

## 2017-07-29 MED ORDER — IBUPROFEN 600 MG PO TABS: 600.0000 mg | ORAL_TABLET | Freq: Four times a day (QID) | ORAL | Status: DC

## 2017-07-29 MED ORDER — LIDOCAINE HCL (PF) 1 % IJ SOLN: INTRAMUSCULAR | Status: DC | PRN

## 2017-07-29 MED ORDER — BENZOCAINE-MENTHOL 20-0.5 % EX AERO: 1.0000 "application " | INHALATION_SPRAY | CUTANEOUS | Status: DC | PRN

## 2017-07-29 MED ORDER — PHENYLEPHRINE 40 MCG/ML (10ML) SYRINGE FOR IV PUSH (FOR BLOOD PRESSURE SUPPORT)
80.0000 ug | PREFILLED_SYRINGE | INTRAVENOUS | Status: DC | PRN
  Filled 2017-07-29: qty 5

## 2017-07-29 MED ORDER — FERROUS SULFATE 325 (65 FE) MG PO TABS
325.0000 mg | ORAL_TABLET | Freq: Two times a day (BID) | ORAL | Status: DC
  Administered 2017-07-30 – 2017-07-31 (×3): 325 mg via ORAL
  Filled 2017-07-29 (×3): qty 1

## 2017-07-29 MED ORDER — EPHEDRINE 5 MG/ML INJ
10.0000 mg | INTRAVENOUS | Status: DC | PRN
  Filled 2017-07-29: qty 2

## 2017-07-29 MED ORDER — LACTATED RINGERS IV SOLN: 500.0000 mL | INTRAVENOUS | Status: DC | PRN

## 2017-07-29 MED ORDER — BUPIVACAINE HCL (PF) 0.25 % IJ SOLN
INTRAMUSCULAR | Status: DC | PRN
Start: 2017-07-29 — End: 2017-07-29
  Administered 2017-07-29: 2 mL via EPIDURAL

## 2017-07-29 MED ORDER — OXYTOCIN 40 UNITS IN LACTATED RINGERS INFUSION - SIMPLE MED
1.0000 m[IU]/min | INTRAVENOUS | Status: DC
  Administered 2017-07-29: 2 m[IU]/min via INTRAVENOUS

## 2017-07-29 MED ORDER — SOD CITRATE-CITRIC ACID 500-334 MG/5ML PO SOLN: 30.0000 mL | ORAL | Status: DC | PRN

## 2017-07-29 MED ORDER — OXYTOCIN 10 UNIT/ML IJ SOLN: 10.0000 [IU] | Freq: Once | INTRAMUSCULAR | Status: DC

## 2017-07-29 MED ORDER — OXYTOCIN BOLUS FROM INFUSION: 500.0000 mL | Freq: Once | INTRAVENOUS | Status: DC

## 2017-07-29 MED ORDER — ACETAMINOPHEN 325 MG PO TABS
650.0000 mg | ORAL_TABLET | ORAL | Status: DC | PRN
Start: 2017-07-29 — End: 2017-07-31
  Administered 2017-07-29 (×2): 650 mg via ORAL
  Administered 2017-07-30: 325 mg via ORAL
  Filled 2017-07-29 (×2): qty 2

## 2017-07-29 MED ORDER — ONDANSETRON HCL 4 MG/2ML IJ SOLN: 4.0000 mg | INTRAMUSCULAR | Status: DC | PRN

## 2017-07-29 MED ORDER — DIPHENHYDRAMINE HCL 50 MG/ML IJ SOLN: 12.5000 mg | INTRAMUSCULAR | Status: DC | PRN

## 2017-07-29 MED ORDER — IBUPROFEN 600 MG PO TABS
600.0000 mg | ORAL_TABLET | Freq: Four times a day (QID) | ORAL | Status: DC | PRN
  Administered 2017-07-29 – 2017-07-31 (×6): 600 mg via ORAL
  Filled 2017-07-29 (×6): qty 1

## 2017-07-29 MED ORDER — LIDOCAINE HCL (PF) 1 % IJ SOLN
INTRAMUSCULAR | Status: DC | PRN
  Administered 2017-07-29: 3 mL

## 2017-07-29 MED ORDER — ONDANSETRON HCL 4 MG/2ML IJ SOLN: 4.0000 mg | Freq: Four times a day (QID) | INTRAMUSCULAR | Status: DC | PRN

## 2017-07-29 MED ORDER — FENTANYL 2.5 MCG/ML W/ROPIVACAINE 0.15% IN NS 100 ML EPIDURAL (ARMC)
12.0000 mL/h | EPIDURAL | Status: DC
  Administered 2017-07-29: 10 mL/h via EPIDURAL

## 2017-07-29 MED ORDER — DIBUCAINE 1 % RE OINT: 1.0000 "application " | TOPICAL_OINTMENT | RECTAL | Status: DC | PRN

## 2017-07-29 MED ORDER — LACTATED RINGERS IV SOLN
INTRAVENOUS | Status: DC
  Administered 2017-07-29: 11:00:00 via INTRAVENOUS

## 2017-07-29 MED ORDER — LIDOCAINE HCL (PF) 2 % IJ SOLN
INTRAMUSCULAR | Status: AC
  Filled 2017-07-29: qty 4

## 2017-07-29 MED ORDER — SENNOSIDES-DOCUSATE SODIUM 8.6-50 MG PO TABS
2.0000 | ORAL_TABLET | ORAL | Status: DC
  Administered 2017-07-30 – 2017-07-31 (×2): 2 via ORAL
  Filled 2017-07-29 (×2): qty 2

## 2017-07-29 MED ORDER — OXYTOCIN 40 UNITS IN LACTATED RINGERS INFUSION - SIMPLE MED: 2.5000 [IU]/h | INTRAVENOUS | Status: DC

## 2017-07-29 MED ORDER — LIDOCAINE HCL (PF) 1 % IJ SOLN: 30.0000 mL | INTRAMUSCULAR | Status: DC | PRN

## 2017-07-29 MED ORDER — DIPHENHYDRAMINE HCL 25 MG PO CAPS: 25.0000 mg | ORAL_CAPSULE | Freq: Four times a day (QID) | ORAL | Status: DC | PRN

## 2017-07-29 MED ORDER — ONDANSETRON HCL 4 MG PO TABS: 4.0000 mg | ORAL_TABLET | ORAL | Status: DC | PRN

## 2017-07-29 NOTE — Discharge Summary (Signed)
OB Discharge Summary     Patient Name: Nancy DikesCrystal L Ross DOB: 11/07/1996 MRN: [id number]  Date of admission: 07/29/2017 Delivering MD: Thomasene MohairStephen Jackson, MD  Date of Delivery: 07/29/2017  Date of discharge: 07/31/2017  Admitting diagnosis: normal labor Intrauterine pregnancy: 6859w2d     Secondary diagnosis: Anemia     Discharge diagnosis: Term Pregnancy Delivered                                                                                                Post partum procedures:none  Augmentation: AROM and Pitocin  Complications: None  Hospital course:  Onset of Labor With Vaginal Delivery     21 y.o. yo [id number] at 1859w2d was admitted in Active Labor on 07/29/2017. Patient had an uncomplicated labor course as follows:  Membrane Rupture Time/Date:   ,    Intrapartum Procedures: Episiotomy: None [1]                                         Lacerations:  2nd degree [3];Vaginal [6];Perineal [11]  Patient had a delivery of a Viable infant. 07/29/2017  Information for the patient's newborn:  Tiburcio PeaRoss, Boy Kataleena [[id number]][[id number]]  Delivery Method: Vag-Spont    Pateint had an uncomplicated postpartum course.  She is ambulating, tolerating a regular diet, passing flatus, and urinating well. Patient is discharged home in stable condition on 07/31/17.   Physical exam  Vitals:   07/30/17 1717 07/30/17 1912 07/31/17 0001 07/31/17 0720  BP: 120/68 132/90  128/73  Pulse: 60 67  67  Resp: 18 20  18   Temp: 98 F (36.7 C) 98.6 F (37 C) 98.2 F (36.8 C) 98.5 F (36.9 C)  TempSrc: Oral Oral Oral Oral  SpO2:  98%  100%  Weight:      Height:       General: alert, cooperative and no distress Lochia: appropriate Uterine Fundus: firm Incision: N/A DVT Evaluation: No evidence of DVT seen on physical exam.  Labs: Lab Results  Component Value Date   WBC 12.5 (H) 07/30/2017   HGB 9.1 (L) 07/30/2017   HCT 27.8 (L) 07/30/2017   MCV 74.6 (L) 07/30/2017   PLT 215 07/30/2017    Discharge instruction: per  After Visit Summary.  Medications:  Allergies as of 07/31/2017      Reactions   Shellfish Allergy Anaphylaxis   Excedrin Migraine [aspirin-acetaminophen-caffeine] Rash      Medication List    TAKE these medications   acetaminophen 325 MG tablet Commonly known as:  TYLENOL Take 325 mg by mouth every 4 (four) hours as needed. Reported on 06/23/2016   cyclobenzaprine 10 MG tablet Commonly known as:  FLEXERIL Take 1 tablet (10 mg total) by mouth every 8 (eight) hours as needed for muscle spasms.   HYDROcodone-acetaminophen 5-325 MG tablet Commonly known as:  NORCO/VICODIN Take 1 tablet by mouth every 6 (six) hours as needed (breakthrough pain).   ibuprofen 600 MG tablet Commonly known as:  ADVIL,MOTRIN Take 1 tablet (600 mg total) by mouth  every 6 (six) hours as needed for mild pain or moderate pain.   multivitamin-prenatal 27-0.8 MG Tabs tablet Take 1 tablet by mouth daily at 12 noon.       Diet: routine diet  Activity: Advance as tolerated. Pelvic rest for 6 weeks.   Outpatient follow up: Follow-up Information    Conard Novak, MD Follow up in 1 week(s).   Specialty:  Obstetrics and Gynecology Why:  postpartum depression check and BP check (hx postpartum preeclampsia) Contact information: [address] [place] [address] [phone]             Postpartum contraception: IUD Paragard Rhogam Given postpartum: no Rubella vaccine given postpartum: no Varicella vaccine given postpartum: no TDaP given antepartum or postpartum: given AP  Newborn Data: Live born female Birth Weight: 7 lb 9.7 oz (3450 g) APGAR: 8, 9   Baby Feeding: Bottle  Disposition:home with mother  SIGNED: Tresea Mall, CNM

## 2017-07-29 NOTE — OB Triage Note (Signed)
Recvd pt from ED. Pt c/o vomiting and diarrhea since 0415 this morning. Says she threw up 2-3 time and has been to the bathroom at least 4 times. States contractions started around 0415 and then was able to fall back asleep. Woke up again at 0500 and came into the hospital. Says she had pork chops for dinner last night. Feeling baby move ok.

## 2017-07-29 NOTE — Anesthesia Procedure Notes (Signed)
Performed by: Nickoli Bagheri       

## 2017-07-29 NOTE — Anesthesia Procedure Notes (Signed)
Epidural Patient location during procedure: OB Start time: 07/29/2017 12:18 PM End time: 07/29/2017 12:42 PM  Staffing Anesthesiologist: Rosaria FerriesPISCITELLO, JOSEPH K Resident/CRNA: Ginger CarneMICHELET, Sou Nohr Performed: resident/CRNA   Preanesthetic Checklist Completed: patient identified, site marked, surgical consent, pre-op evaluation, timeout performed, IV checked, risks and benefits discussed and monitors and equipment checked  Epidural Patient position: sitting Prep: Betadine Patient monitoring: heart rate, continuous pulse ox and blood pressure Approach: midline Location: L4-L5 Injection technique: LOR saline  Needle:  Needle type: Tuohy  Needle gauge: 17 G Needle length: 9 cm and 9 Needle insertion depth: 7 cm Catheter type: closed end flexible Catheter size: 19 Gauge Catheter at skin depth: 11 cm Test dose: negative and 1.5% lidocaine with Epi 1:200 K  Assessment Sensory level: T10 Events: blood not aspirated, injection not painful, no injection resistance, negative IV test and no paresthesia  Additional Notes Pt. Evaluated and documentation done after procedure finished. Patient identified. Risks/Benefits/Options discussed with patient including but not limited to bleeding, infection, nerve damage, paralysis, failed block, incomplete pain control, headache, blood pressure changes, nausea, vomiting, reactions to medication both or allergic, itching and postpartum back pain. Confirmed with bedside nurse the patient's most recent platelet count. Confirmed with patient that they are not currently taking any anticoagulation, have any bleeding history or any family history of bleeding disorders. Patient expressed understanding and wished to proceed. All questions were answered. Sterile technique was used throughout the entire procedure. Please see nursing notes for vital signs. Test dose was given through epidural catheter and negative prior to continuing to dose epidural or start infusion.  Warning signs of high block given to the patient including shortness of breath, tingling/numbness in hands, complete motor block, or any concerning symptoms with instructions to call for help. Patient was given instructions on fall risk and not to get out of bed. All questions and concerns addressed with instructions to call with any issues or inadequate analgesia.   Patient tolerated the insertion well without immediate complications.Reason for block:procedure for pain

## 2017-07-29 NOTE — Anesthesia Preprocedure Evaluation (Addendum)
Anesthesia Evaluation  Patient identified by MRN, date of birth, ID band Patient awake    Reviewed: Allergy & Precautions, H&P , NPO status   History of Anesthesia Complications Negative for: history of anesthetic complications  Airway Mallampati: II   Neck ROM: full    Dental no notable dental hx.    Pulmonary asthma , former smoker,    Pulmonary exam normal        Cardiovascular Exercise Tolerance: Good hypertension, negative cardio ROS Normal cardiovascular exam     Neuro/Psych PSYCHIATRIC DISORDERS negative neurological ROS     GI/Hepatic negative GI ROS, Neg liver ROS,   Endo/Other  negative endocrine ROS  Renal/GU negative Renal ROS     Musculoskeletal   Abdominal   Peds  Hematology  (+) anemia ,   Anesthesia Other Findings   Reproductive/Obstetrics (+) Pregnancy                             Anesthesia Physical Anesthesia Plan  ASA: II  Anesthesia Plan: Epidural   Post-op Pain Management:    Induction:   PONV Risk Score and Plan: 0 and Ondansetron, Dexamethasone and Treatment may vary due to age  Airway Management Planned:   Additional Equipment:   Intra-op Plan:   Post-operative Plan:   Informed Consent: I have reviewed the patients History and Physical, chart, labs and discussed the procedure including the risks, benefits and alternatives for the proposed anesthesia with the patient or authorized representative who has indicated his/her understanding and acceptance.   Dental Advisory Given  Plan Discussed with: Anesthesiologist  Anesthesia Plan Comments:        Anesthesia Quick Evaluation

## 2017-07-29 NOTE — H&P (Signed)
OB History & Physical   History of Present Illness:  Chief Complaint: nausea, vomiting, diarrhea, contractions   HPI:  Nancy Ball is a 21 y.o. 143P2002 female at 2065w2d dated by a 6 week ultrasound.  Her pregnancy has been complicated by history of preeclampsia postpartum in prior pregnancy, anemia, astham, history of postpartum depression.    She reports contractions.   She denies leakage of fluid.   She denies vaginal bleeding.   She reports fetal movement.   She began having nausea, vomiting, and diarrhea early this morning.  She also began having contractions around the same time.    Maternal Medical History:   Past Medical History:  Diagnosis Date  . Anemia during pregnancy   . 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 Asthma    well controlled, last episode in high school with running track  . Depression    PP depression, on zoloft presently  . Hypertension   . Postpartum depression     Past Surgical History:  Procedure Laterality Date  . WISDOM TOOTH EXTRACTION Bilateral 2013   states she was put to sleep    Allergies  Allergen Reactions  . Shellfish Allergy Anaphylaxis  . Excedrin Migraine [Aspirin-Acetaminophen-Caffeine] Rash    Prior to Admission medications   Medication Sig Start Date End Date Taking? Authorizing Provider  acetaminophen (TYLENOL) 325 MG tablet Take 325 mg by mouth every 4 (four) hours as needed. Reported on 06/23/2016   Yes [provider]  cyclobenzaprine (FLEXERIL) 10 MG tablet Take 1 tablet (10 mg total) by mouth every 8 (eight) hours as needed for muscle spasms. 05/27/17  Yes Nadara MustardHarris, Robert P, MD  Prenatal Vit-Fe Fumarate-FA (MULTIVITAMIN-PRENATAL) 27-0.8 MG TABS tablet Take 1 tablet by mouth daily at 12 noon.   Yes [provider]    OB History  Gravida Para Term Preterm AB Living  3 2 2     2   SAB TAB Ectopic Multiple Live Births        0 2    # Outcome Date GA Lbr  Len/2nd Weight Sex Delivery Anes PTL Lv  3 Current           2 Term 08/14/16 5432w5d  7 lb 6.2 oz (3.35 kg) F Vag-Spont EPI  LIV  1 Term 07/25/14 9560w1d   F Vag-Spont EPI  LIV     Complications: Low iron    Obstetric Comments  States low Iron with previous labor and with tis pregnancy    Prenatal care site: [facility]  Social History: She  reports that she quit smoking about 7 months ago. Her smoking use included Cigarettes. She has a 1.00 pack-year smoking history. She has never used smokeless tobacco. She reports that she does not drink alcohol or use drugs.  Family History: family history includes Cancer in her paternal aunt.   Review of Systems: Negative x 10 systems reviewed except as noted in the HPI.    Physical Exam:  Vital Signs: BP 117/72 (BP Location: Left Arm)   Pulse (!) 103   Temp 98.2 F (36.8 C) (Oral)   Resp 16   Ht 4\' 11"  (1.499 m)   Wt 150 lb (68 kg)   LMP 11/03/2016   BMI 30.30 kg/m  Physical Exam  Constitutional: She is oriented to person, place, and time. She appears well-developed and well-nourished. No distress.  HENT:  Head: Normocephalic and atraumatic.  Eyes: Conjunctivae are normal.  Neck: Normal range of motion. Neck supple.  Cardiovascular: Normal rate and regular rhythm.   Murmur (II/VI SEM) heard. Pulmonary/Chest: Effort normal and breath sounds normal. She has no wheezes.  Abdominal: Soft. She exhibits no distension. There is no guarding. Hernia confirmed negative in the right inguinal area and confirmed negative in the left inguinal area.  Gravid uterus, EFW 8 pounds, fetus palpates cephalic  Genitourinary: Pelvic exam was performed with patient supine. There is no rash, tenderness or lesion on the right labia. There is no rash, tenderness or lesion on the left labia.  Genitourinary Comments: 5 cm per RN (change from 4 cm earlier)  Musculoskeletal: Normal range of motion.  Lymphadenopathy:       Right: No inguinal adenopathy present.        Left: No inguinal adenopathy present.  Neurological: She is alert and oriented to person, place, and time.  Skin: Skin is warm and dry. No rash noted.  Psychiatric: She has a normal mood and affect. Her behavior is normal. Judgment normal.    Pertinent Results:  Prenatal Labs: Blood type/Rh B positive  Antibody screen negative  Rubella Immune  Varicella Immune    RPR NR  HBsAg negative  HIV negative  GC negative  Chlamydia negative  Genetic screening Quad screen negative  1 hour GTT 116  3 hour GTT n/a  GBS negative on 07/10/17   Baseline FHR: 120 beats/min   Variability: moderate   Accelerations: present   Decelerations: absent Contractions: present frequency: 2 q 10 min Overall assessment: cat 1  Assessment:  Nancy Ball is a 65 y.o. G66P2002 female at [redacted]w[redacted]d with active labor. Also with symptoms of nausea, vomiting, diarrhea..   Plan:  1. Admit to Labor & Delivery  2. CBC, T&S, Clrs, IVF 3. GBS negative.   4. Fetwal well-being: reassuring, cat 1 5. Gastroenteritis: continue to monitor.  May be labor related.  Will treat symptomatically at this time.   Thomasene Mohair, MD 07/29/2017 10:17 AM

## 2017-07-30 LAB — CBC
HEMATOCRIT: 27.8 % — AB (ref 35.0–47.0)
Hemoglobin: 9.1 g/dL — ABNORMAL LOW (ref 12.0–16.0)
MCH: 24.4 pg — ABNORMAL LOW (ref 26.0–34.0)
MCHC: 32.7 g/dL (ref 32.0–36.0)
MCV: 74.6 fL — AB (ref 80.0–100.0)
Platelets: 215 10*3/uL (ref 150–440)
RBC: 3.73 MIL/uL — AB (ref 3.80–5.20)
RDW: 14.4 % (ref 11.5–14.5)
WBC: 12.5 10*3/uL — AB (ref 3.6–11.0)

## 2017-07-30 LAB — RPR: RPR: NONREACTIVE

## 2017-07-30 NOTE — Progress Notes (Signed)
Subjective:  Doing well, minimal lochia, no fever, no chills  Objective:   Blood pressure 128/72, pulse 64, temperature (!) 97.5 F (36.4 C), temperature source Oral, resp. rate 18, height 4' 11"  (1.499 m), weight 150 lb (68 kg), last menstrual period 11/03/2016, SpO2 99 %, unknown if currently breastfeeding.  General: NAD Pulmonary: no increased work of breathing Abdomen: non-distended, non-tender, fundus firm at level of umbilicus Extremities: no edema, no erythema, no tenderness  Results for orders placed or performed during the hospital encounter of 07/29/17 (from the past 72 hour(s))  Comprehensive metabolic panel     Status: Abnormal   Collection Time: 07/29/17  7:08 AM  Result Value Ref Range   Sodium 139 135 - 145 mmol/L   Potassium 3.2 (L) 3.5 - 5.1 mmol/L   Chloride 110 101 - 111 mmol/L   CO2 22 22 - 32 mmol/L   Glucose, Bld 84 65 - 99 mg/dL   BUN 6 6 - 20 mg/dL   Creatinine, Ser 0.56 0.44 - 1.00 mg/dL   Calcium 8.6 (L) 8.9 - 10.3 mg/dL   Total Protein 6.4 (L) 6.5 - 8.1 g/dL   Albumin 3.1 (L) 3.5 - 5.0 g/dL   AST 18 15 - 41 U/L   ALT 9 (L) 14 - 54 U/L   Alkaline Phosphatase 112 38 - 126 U/L   Total Bilirubin 0.5 0.3 - 1.2 mg/dL   GFR calc non Af Amer >60 >60 mL/min   GFR calc Af Amer >60 >60 mL/min    Comment: (NOTE) The eGFR has been calculated using the CKD EPI equation. This calculation has not been validated in all clinical situations. eGFR's persistently <60 mL/min signify possible Chronic Kidney Disease.    Anion gap 7 5 - 15  Lipase, blood     Status: None   Collection Time: 07/29/17  7:08 AM  Result Value Ref Range   Lipase 28 11 - 51 U/L  CBC     Status: Abnormal   Collection Time: 07/29/17  7:08 AM  Result Value Ref Range   WBC 8.9 3.6 - 11.0 K/uL   RBC 3.96 3.80 - 5.20 MIL/uL   Hemoglobin 9.7 (L) 12.0 - 16.0 g/dL   HCT 29.3 (L) 35.0 - 47.0 %   MCV 74.1 (L) 80.0 - 100.0 fL   MCH 24.6 (L) 26.0 - 34.0 pg   MCHC 33.2 32.0 - 36.0 g/dL   RDW 14.5  11.5 - 14.5 %   Platelets 220 150 - 440 K/uL  Type and screen [place]     Status: None   Collection Time: 07/29/17 10:30 AM  Result Value Ref Range   ABO/RH(D) B POS    Antibody Screen NEG    Sample Expiration 08/01/2017   RPR     Status: None   Collection Time: 07/29/17 10:30 AM  Result Value Ref Range   RPR Ser Ql Non Reactive Non Reactive    Comment: (NOTE) Performed At: [facility] [place], [place] [id number] Lindon Romp MD WU:[id number]   CBC     Status: Abnormal   Collection Time: 07/30/17  4:02 AM  Result Value Ref Range   WBC 12.5 (H) 3.6 - 11.0 K/uL   RBC 3.73 (L) 3.80 - 5.20 MIL/uL   Hemoglobin 9.1 (L) 12.0 - 16.0 g/dL   HCT 27.8 (L) 35.0 - 47.0 %   MCV 74.6 (L) 80.0 - 100.0 fL   MCH 24.4 (L) 26.0 - 34.0 pg  MCHC 32.7 32.0 - 36.0 g/dL   RDW 14.4 11.5 - 14.5 %   Platelets 215 150 - 440 K/uL    Assessment:   21 y.o. G3P3003 postpartum day # 1 TSVD  Plan:    1) Acute blood loss anemia - hemodynamically stable and asymptomatic - po ferrous sulfate  2) Blood Type --/--/B POS (08/08 1030) / Rubella Immune / Varicella Immune  3) TDAP status 05/19/17  4) Bottle/IUD  5) Postpartum preeclampsia - 1 week BP check  6) Disposition - anticipate discharge PPD2

## 2017-07-30 NOTE — Anesthesia Postprocedure Evaluation (Signed)
Anesthesia Post Note  Patient: Nancy Ball  Procedure(s) Performed: * No procedures listed *  Patient location during evaluation: Mother Baby Anesthesia Type: Epidural Level of consciousness: awake and alert Pain management: pain level controlled Vital Signs Assessment: post-procedure vital signs reviewed and stable Respiratory status: spontaneous breathing Cardiovascular status: stable Postop Assessment: no backache and no headache Anesthetic complications: no     Last Vitals:  Vitals:   07/30/17 0344 07/30/17 0839  BP: 129/64 128/72  Pulse: 69 64  Resp: 18 18  Temp: 36.7 C (!) 36.4 C  SpO2:  99%    Last Pain:  Vitals:   07/30/17 0839  TempSrc: Oral  PainSc:                  [name],  [name]

## 2017-07-31 ENCOUNTER — Encounter: Payer: Medicaid Other | Admitting: Obstetrics and Gynecology

## 2017-07-31 MED ORDER — IBUPROFEN 600 MG PO TABS: 600.0000 mg | ORAL_TABLET | Freq: Four times a day (QID) | ORAL | 0 refills | Status: DC | PRN

## 2017-07-31 MED ORDER — HYDROCODONE-ACETAMINOPHEN 5-325 MG PO TABS: 1.0000 | ORAL_TABLET | Freq: Four times a day (QID) | ORAL | 0 refills | Status: DC | PRN

## 2017-07-31 NOTE — Progress Notes (Signed)
Patient discharged home with infant. Discharge instructions, prescriptions and follow up appointment given to and reviewed with patient. Patient verbalized understanding. Pt wheeled out by auxiliary.

## 2017-08-06 ENCOUNTER — Encounter: Payer: Self-pay | Admitting: Obstetrics and Gynecology

## 2017-08-06 ENCOUNTER — Ambulatory Visit (INDEPENDENT_AMBULATORY_CARE_PROVIDER_SITE_OTHER): Payer: Medicaid Other | Admitting: Obstetrics and Gynecology

## 2017-08-06 DIAGNOSIS — Z8759 Personal history of other complications of pregnancy, childbirth and the puerperium: Secondary | ICD-10-CM

## 2017-08-06 DIAGNOSIS — Z8659 Personal history of other mental and behavioral disorders: Secondary | ICD-10-CM | POA: Insufficient documentation

## 2017-08-06 NOTE — Progress Notes (Signed)
Obstetrics & Gynecology Office Visit   Chief Complaint  Patient presents with  . PostPartum BP Check  . Follow-up    History of Present Illness: 21 y.o. G75P3003 female who is postpartum day #8 who presents for BP and postpartum depression check.  Denies any symptoms as they relate to depression. States she has occasional headaches and sees spots. None today.  Has a BP cuff at home, but has not check her blood pressure.  Her infant is doing well.   Past Medical History:  Diagnosis Date  . Anemia during pregnancy   . 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 Asthma    well controlled, last episode in high school with running track  . Depression    PP depression, on zoloft presently  . Hypertension   . Postpartum depression     Past Surgical History:  Procedure Laterality Date  . WISDOM TOOTH EXTRACTION Bilateral 2013   states she was put to sleep    Gynecologic History: No LMP recorded.  Obstetric History: [id number]  Family History  Problem Relation Age of Onset  . Cancer Paternal Aunt     Social History   Social History  . Marital status: Married    Spouse name: N/A  . Number of children: N/A  . Years of education: N/A   Occupational History  . Not on file.   Social History Main Topics  . Smoking status: Former Smoker    Packs/day: 0.50    Years: 2.00    Types: Cigarettes    Quit date: 12/05/2016  . Smokeless tobacco: Never Used  . Alcohol use No  . Drug use: No  . Sexual activity: Yes   Other Topics Concern  . Not on file   Social History Narrative  . No narrative on file    Allergies  Allergen Reactions  . Shellfish Allergy Anaphylaxis  . Excedrin Migraine [Aspirin-Acetaminophen-Caffeine] Rash    Prior to Admission medications   Medication Sig Start Date End Date Taking? Authorizing Provider  acetaminophen (TYLENOL) 325 MG tablet Take 325 mg by mouth every 4 (four) hours as needed.  Reported on 06/23/2016   Yes [provider]  cyclobenzaprine (FLEXERIL) 10 MG tablet Take 1 tablet (10 mg total) by mouth every 8 (eight) hours as needed for muscle spasms. 05/27/17  Yes Nadara Mustard, MD  HYDROcodone-acetaminophen (NORCO/VICODIN) 5-325 MG tablet Take 1 tablet by mouth every 6 (six) hours as needed (breakthrough pain). 07/31/17  Yes Tresea Mall, CNM  ibuprofen (ADVIL,MOTRIN) 600 MG tablet Take 1 tablet (600 mg total) by mouth every 6 (six) hours as needed for mild pain or moderate pain. 07/31/17  Yes Tresea Mall, CNM  Prenatal Vit-Fe Fumarate-FA (MULTIVITAMIN-PRENATAL) 27-0.8 MG TABS tablet Take 1 tablet by mouth daily at 12 noon.   Yes [provider]    Review of Systems  Constitutional: Negative.   HENT: Negative.   Eyes: Negative.   Respiratory: Negative.   Cardiovascular: Negative.   Gastrointestinal: Negative.   Genitourinary: Negative.   Musculoskeletal: Negative.   Skin: Negative.   Neurological: Negative.   Psychiatric/Behavioral: Negative.      Physical Exam BP (!) 142/88   Ht 4\' 11"  (1.499 m)   Wt 135 lb (61.2 kg)   BMI 27.27 kg/m  No LMP recorded. Physical Exam  Constitutional: She is oriented to person, place, and time. She appears well-developed and well-nourished. No distress.  HENT:  Head: Normocephalic and atraumatic.  Eyes: EOM are normal. No scleral icterus.  Neck: Normal range of motion. Neck supple.  Cardiovascular: Normal rate and regular rhythm.   Pulmonary/Chest: Effort normal and breath sounds normal. No respiratory distress. She has no wheezes. She has no rales.  Musculoskeletal: Normal range of motion. She exhibits no edema.  Neurological: She is alert and oriented to person, place, and time. She displays normal reflexes. No cranial nerve deficit.  Skin: Skin is warm and dry. No erythema.  Psychiatric: She has a normal mood and affect. Her behavior is normal. Judgment normal.   EPDS: 2 (zero on  #10)  Assessment: 21 y.o. 813P3003 female PPD#8 here for BP check and depression check  Plan: Problem List Items Addressed This Visit    History of pre-eclampsia   Postpartum care following vaginal delivery - Primary   History of depression    Continue to monitor BPs at home. If headaches worsen and do not remit with Tylenol, let us know. If BPs increase and symptoms develop, let clinic know.  Discussed other concerning symptoms. She voiced understanding.   She has no signs/symptoms of postpartum depression today.  Return in about 5 weeks (around 09/10/2017).   Thomasene MohairStephen Hulbert Branscome, MD 08/06/2017 5:20 PM

## 2017-09-11 ENCOUNTER — Ambulatory Visit: Payer: Self-pay | Admitting: Obstetrics and Gynecology

## 2017-09-17 ENCOUNTER — Other Ambulatory Visit: Payer: Self-pay | Admitting: Advanced Practice Midwife

## 2017-09-18 ENCOUNTER — Other Ambulatory Visit: Payer: Self-pay | Admitting: Advanced Practice Midwife

## 2017-09-18 MED ORDER — IBUPROFEN 600 MG PO TABS: 600.0000 mg | ORAL_TABLET | Freq: Four times a day (QID) | ORAL | 0 refills | Status: DC | PRN

## 2017-11-03 ENCOUNTER — Encounter: Payer: Self-pay | Admitting: *Deleted

## 2017-11-03 ENCOUNTER — Emergency Department
Admission: EM | Admit: 2017-11-03 | Discharge: 2017-11-03 | Disposition: A | Discharge status: Home / Self Care | Payer: Medicaid Other | Attending: Emergency Medicine | Admitting: Emergency Medicine

## 2017-11-03 DIAGNOSIS — R0981 Nasal congestion: Secondary | ICD-10-CM | POA: Insufficient documentation

## 2017-11-03 DIAGNOSIS — Z87891 Personal history of nicotine dependence: Secondary | ICD-10-CM | POA: Insufficient documentation

## 2017-11-03 DIAGNOSIS — I10 Essential (primary) hypertension: Secondary | ICD-10-CM | POA: Diagnosis not present

## 2017-11-03 DIAGNOSIS — Z791 Long term (current) use of non-steroidal anti-inflammatories (NSAID): Secondary | ICD-10-CM | POA: Insufficient documentation

## 2017-11-03 DIAGNOSIS — J029 Acute pharyngitis, unspecified: Secondary | ICD-10-CM | POA: Diagnosis present

## 2017-11-03 DIAGNOSIS — J45909 Unspecified asthma, uncomplicated: Secondary | ICD-10-CM | POA: Insufficient documentation

## 2017-11-03 DIAGNOSIS — Z79899 Other long term (current) drug therapy: Secondary | ICD-10-CM | POA: Insufficient documentation

## 2017-11-03 DIAGNOSIS — J069 Acute upper respiratory infection, unspecified: Secondary | ICD-10-CM | POA: Insufficient documentation

## 2017-11-03 LAB — POCT RAPID STREP A: STREPTOCOCCUS, GROUP A SCREEN (DIRECT): NEGATIVE

## 2017-11-03 MED ORDER — FLUTICASONE PROPIONATE 50 MCG/ACT NA SUSP: 2.0000 | Freq: Every day | NASAL | 0 refills | Status: DC

## 2017-11-03 MED ORDER — BENZONATATE 100 MG PO CAPS: ORAL_CAPSULE | ORAL | 0 refills | Status: DC

## 2017-11-03 MED ORDER — PREDNISONE 20 MG PO TABS: 40.0000 mg | ORAL_TABLET | Freq: Once | ORAL | Status: DC

## 2017-11-03 NOTE — Discharge Instructions (Signed)
Your rapid strep test was negative. You have a throat culture which is pending. You will be notified by phone, if treatment with antibiotics is warranted. Take the prescription meds as directed. Follow-up with your provider for continued symptoms.

## 2017-11-03 NOTE — ED Notes (Signed)
Pt dc with family

## 2017-11-03 NOTE — ED Provider Notes (Signed)
[facility] Emergency Department Provider Note ____________________________________________  Time seen: 1316  I have reviewed the triage vital signs and the nursing notes.  HISTORY  Chief Complaint  Sore Throat  HPI Nancy Ball is a 21 y.o. female presents to the ED for evaluation of sore throat that began last night.  Patient denies any other symptoms including fevers, chills, or sweats.  She does note some sinus congestion and postnasal drainage.  She denies any sick contacts, recent travel, or other exposures.  Past Medical History:  Diagnosis Date  . Anemia during pregnancy   . 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 Asthma    well controlled, last episode in high school with running track  . Depression    PP depression, on zoloft presently  . Hypertension   . Postpartum depression     Patient Active Problem List   Diagnosis Date Noted  . History of depression 08/06/2017  . Postpartum care following vaginal delivery 07/31/2017  . Anemia complicating pregnancy, third trimester 06/03/2017  . History of pre-eclampsia 02/23/2017  . Asthma 02/23/2017    Past Surgical History:  Procedure Laterality Date  . WISDOM TOOTH EXTRACTION Bilateral 2013   states she was put to sleep    Prior to Admission medications   Medication Sig Start Date End Date Taking? Authorizing Provider  acetaminophen (TYLENOL) 325 MG tablet Take 325 mg by mouth every 4 (four) hours as needed. Reported on 06/23/2016    [provider]  benzonatate (TESSALON PERLES) 100 MG capsule Take 1-2 tabs TID prn cough 11/03/17   Manda Holstad, Charlesetta IvoryJenise V Bacon, PA-C  cyclobenzaprine (FLEXERIL) 10 MG tablet Take 1 tablet (10 mg total) by mouth every 8 (eight) hours as needed for muscle spasms. 05/27/17   Nadara MustardHarris, Robert P, MD  fluticasone (FLONASE) 50 MCG/ACT nasal spray Place 2 sprays daily into both nostrils. 11/03/17   Teara Duerksen, Charlesetta IvoryJenise V  Bacon, PA-C  HYDROcodone-acetaminophen (NORCO/VICODIN) 5-325 MG tablet Take 1 tablet by mouth every 6 (six) hours as needed (breakthrough pain). 07/31/17   Tresea MallGledhill, Jane, CNM  ibuprofen (ADVIL,MOTRIN) 600 MG tablet Take 1 tablet (600 mg total) by mouth every 6 (six) hours as needed for mild pain or moderate pain. 09/18/17   Tresea MallGledhill, Jane, CNM  Prenatal Vit-Fe Fumarate-FA (MULTIVITAMIN-PRENATAL) 27-0.8 MG TABS tablet Take 1 tablet by mouth daily at 12 noon.    [provider]    Allergies Shellfish allergy and Excedrin migraine [aspirin-acetaminophen-caffeine]  Family History  Problem Relation Age of Onset  . Cancer Paternal Aunt     Social History Social History   Tobacco Use  . Smoking status: Former Smoker    Packs/day: 0.50    Years: 2.00    Pack years: 1.00    Types: Cigarettes    Last attempt to quit: 12/05/2016    Years since quitting: 0.9  . Smokeless tobacco: Never Used  Substance Use Topics  . Alcohol use: No  . Drug use: No    Review of Systems  Constitutional: Negative for fever. Eyes: Negative for visual changes. ENT: Positive for sore throat. Cardiovascular: Negative for chest pain. Respiratory: Negative for shortness of breath. Gastrointestinal: Negative for abdominal pain, vomiting and diarrhea. Genitourinary: Negative for dysuria. Musculoskeletal: Negative for back pain. Skin: Negative for rash. Neurological: Negative for headaches, focal weakness or numbness. ____________________________________________  PHYSICAL EXAM:  VITAL SIGNS: ED Triage Vitals  Enc Vitals Group     BP 11/03/17  1112 122/75     Pulse Rate 11/03/17 1112 (!) 107     Resp 11/03/17 1112 18     Temp 11/03/17 1112 99.2 F (37.3 C)     Temp Source 11/03/17 1112 Oral     SpO2 11/03/17 1112 97 %     Weight 11/03/17 1112 135 lb (61.2 kg)     Height 11/03/17 1112 4\' 11"  (1.499 m)     Head Circumference --      Peak Flow --      Pain Score 11/03/17 1111 10     Pain Loc  --      Pain Edu? --      Excl. in GC? --     Constitutional: Alert and oriented. Well appearing and in no distress. Head: Normocephalic and atraumatic. Eyes: Conjunctivae are normal. PERRL. Normal extraocular movements Ears: Canals clear. TMs intact bilaterally. Nose: No congestion/rhinorrhea/epistaxis.  Nasal turbinates are slightly edematous and enlarged. Mouth/Throat: Mucous membranes are moist.  Uvula is midline and tonsils are flat.  No oropharyngeal lesions are appreciated.  There is generalized erythema to the entire oropharynx. Neck: Supple. No thyromegaly. Hematological/Lymphatic/Immunological: No cervical lymphadenopathy. Cardiovascular: Normal rate, regular rhythm. Normal distal pulses. Respiratory: Normal respiratory effort. No wheezes/rales/rhonchi. Gastrointestinal: Soft and nontender. No distention. ___________________________________________   LABS (pertinent positives/negatives)  Labs Reviewed  CULTURE, GROUP A STREP [facility]  POCT RAPID STREP A  ____________________________________________  PROCEDURES  Prednisone 40 mg PO ____________________________________________  INITIAL IMPRESSION / ASSESSMENT AND PLAN / ED COURSE  Patient with ED evaluation of sudden sore throat.  Her exam is overall benign and she has a negative rapid strep test at this time.  Patient has a throat culture pending at the time of discharge.  She will be discharged [facility] and Flonase.  She will continue to dose over-the-counter allergy medicine and decongestant for symptom relief.  Symptoms likely represent a viral URI or viral pharyngitis this time.  Return precautions are reviewed.  ____________________________________________  FINAL CLINICAL IMPRESSION(S) / ED DIAGNOSES  Final diagnoses:  Viral upper respiratory tract infection      Lissa HoardMenshew, Martine Trageser V Bacon, PA-C 11/03/17 1642    Jeanmarie PlantMcShane, James A, MD 11/06/17 [phone]

## 2017-11-03 NOTE — ED Triage Notes (Signed)
States sore throat that began last night, awake and alert in no acute distress

## 2017-11-03 NOTE — ED Notes (Signed)
Pt verbalizes understanding of d/c instructions, medications and follow up 

## 2017-11-04 LAB — CULTURE, GROUP A STREP (THRC)

## 2017-11-13 ENCOUNTER — Emergency Department
Admission: EM | Admit: 2017-11-13 | Discharge: 2017-11-13 | Disposition: A | Discharge status: Home / Self Care | Payer: Medicaid Other | Attending: Emergency Medicine | Admitting: Emergency Medicine

## 2017-11-13 ENCOUNTER — Encounter: Payer: Self-pay | Admitting: Emergency Medicine

## 2017-11-13 DIAGNOSIS — I10 Essential (primary) hypertension: Secondary | ICD-10-CM | POA: Diagnosis not present

## 2017-11-13 DIAGNOSIS — M25562 Pain in left knee: Secondary | ICD-10-CM

## 2017-11-13 DIAGNOSIS — Z87891 Personal history of nicotine dependence: Secondary | ICD-10-CM | POA: Insufficient documentation

## 2017-11-13 DIAGNOSIS — S80212A Abrasion, left knee, initial encounter: Secondary | ICD-10-CM

## 2017-11-13 DIAGNOSIS — Y929 Unspecified place or not applicable: Secondary | ICD-10-CM | POA: Insufficient documentation

## 2017-11-13 DIAGNOSIS — Y9301 Activity, walking, marching and hiking: Secondary | ICD-10-CM | POA: Insufficient documentation

## 2017-11-13 DIAGNOSIS — J45909 Unspecified asthma, uncomplicated: Secondary | ICD-10-CM | POA: Diagnosis not present

## 2017-11-13 DIAGNOSIS — W228XXA Striking against or struck by other objects, initial encounter: Secondary | ICD-10-CM | POA: Insufficient documentation

## 2017-11-13 DIAGNOSIS — Y990 Civilian activity done for income or pay: Secondary | ICD-10-CM | POA: Diagnosis not present

## 2017-11-13 DIAGNOSIS — S8992XA Unspecified injury of left lower leg, initial encounter: Secondary | ICD-10-CM | POA: Diagnosis present

## 2017-11-13 DIAGNOSIS — Z79899 Other long term (current) drug therapy: Secondary | ICD-10-CM | POA: Diagnosis not present

## 2017-11-13 DIAGNOSIS — L03116 Cellulitis of left lower limb: Secondary | ICD-10-CM

## 2017-11-13 MED ORDER — SULFAMETHOXAZOLE-TRIMETHOPRIM 800-160 MG PO TABS
1.0000 | ORAL_TABLET | Freq: Once | ORAL | Status: AC
  Administered 2017-11-13: 1 via ORAL
  Filled 2017-11-13: qty 1

## 2017-11-13 MED ORDER — SULFAMETHOXAZOLE-TRIMETHOPRIM 800-160 MG PO TABS: 1.0000 | ORAL_TABLET | Freq: Two times a day (BID) | ORAL | 0 refills | Status: DC

## 2017-11-13 MED ORDER — IBUPROFEN 800 MG PO TABS: 800.0000 mg | ORAL_TABLET | Freq: Three times a day (TID) | ORAL | 0 refills | Status: DC | PRN

## 2017-11-13 NOTE — ED Provider Notes (Signed)
[facility] EMERGENCY DEPARTMENT Provider Note   CSN: [id number] Arrival date & time: 11/13/17  2218     History   Chief Complaint Chief Complaint  Patient presents with  . Knee Pain    HPI Nancy Ball is a 21 y.o. female presents to the emergency department for evaluation of left knee pain.  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  She did not think anything about it at the time.  Today she noticed a wound, abrasion to the anterior aspect of the left knee with mild redness and increasing discomfort.  She has been limping today.  She denies any pain after the accident 3 days ago.  She has not been walking with a limp over the last 3 days.  She is currently ambulatory with no assistive devices.  She has been taking ibuprofen with mild improvement.  She has continued to work.  Denies any swelling throughout the knee.  HPI  Past Medical History:  Diagnosis Date  . Anemia during pregnancy   . 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 Asthma    well controlled, last episode in high school with running track  . Depression    PP depression, on zoloft presently  . Hypertension   . Postpartum depression     Patient Active Problem List   Diagnosis Date Noted  . History of depression 08/06/2017  . Postpartum care following vaginal delivery 07/31/2017  . Anemia complicating pregnancy, third trimester 06/03/2017  . History of pre-eclampsia 02/23/2017  . Asthma 02/23/2017    Past Surgical History:  Procedure Laterality Date  . WISDOM TOOTH EXTRACTION Bilateral 2013   states she was put to sleep    OB History    Gravida Para Term Preterm AB Living   3 3 3     3    SAB TAB Ectopic Multiple Live Births         0 3      Obstetric Comments   States low Iron with previous labor and with tis pregnancy       Home Medications      Prior to Admission medications   Medication Sig Start Date End Date Taking? Authorizing Provider  acetaminophen (TYLENOL) 325 MG tablet Take 325 mg by mouth every 4 (four) hours as needed. Reported on 06/23/2016    [provider]  benzonatate (TESSALON PERLES) 100 MG capsule Take 1-2 tabs TID prn cough 11/03/17   Menshew, Charlesetta IvoryJenise V Bacon, PA-C  cyclobenzaprine (FLEXERIL) 10 MG tablet Take 1 tablet (10 mg total) by mouth every 8 (eight) hours as needed for muscle spasms. 05/27/17   Nadara MustardHarris, Robert P, MD  fluticasone (FLONASE) 50 MCG/ACT nasal spray Place 2 sprays daily into both nostrils. 11/03/17   Menshew, Charlesetta IvoryJenise V Bacon, PA-C  HYDROcodone-acetaminophen (NORCO/VICODIN) 5-325 MG tablet Take 1 tablet by mouth every 6 (six) hours as needed (breakthrough pain). 07/31/17   Tresea MallGledhill, Jane, CNM  ibuprofen (ADVIL,MOTRIN) 800 MG tablet Take 1 tablet (800 mg total) by mouth every 8 (eight) hours as needed. 11/13/17   Evon SlackGaines, Thomas C, PA-C  Prenatal Vit-Fe Fumarate-FA (MULTIVITAMIN-PRENATAL) 27-0.8 MG TABS tablet Take 1 tablet by mouth daily at 12 noon.    [provider]  sulfamethoxazole-trimethoprim (BACTRIM DS,SEPTRA DS) 800-160 MG tablet Take 1 tablet by mouth 2 (two) times daily. 11/13/17   Amador CunasGaines, Thomas  C, PA-C    Family History Family History  Problem Relation Age of Onset  . Cancer Paternal Aunt     Social History Social History   Tobacco Use  . Smoking status: Former Smoker    Packs/day: 0.50    Years: 2.00    Pack years: 1.00    Types: Cigarettes    Last attempt to quit: 12/05/2016    Years since quitting: 0.9  . Smokeless tobacco: Never Used  Substance Use Topics  . Alcohol use: No  . Drug use: No     Allergies   Shellfish allergy and Excedrin migraine [aspirin-acetaminophen-caffeine]   Review of Systems Review of Systems  Constitutional: Negative for fever.  Respiratory: Negative for shortness of breath.   Cardiovascular: Negative for chest pain.   Gastrointestinal: Negative for abdominal pain.  Genitourinary: Negative for difficulty urinating, dysuria and urgency.  Musculoskeletal: Negative for arthralgias, back pain, joint swelling and myalgias.  Skin: Positive for wound. Negative for rash.  Neurological: Negative for dizziness and headaches.     Physical Exam Updated Vital Signs BP 124/79 (BP Location: Right Arm)   Pulse 86   Temp 98.5 F (36.9 C) (Oral)   Resp 18   LMP 10/26/2017 (Exact Date)   SpO2 100%   Physical Exam  Constitutional: She is oriented to person, place, and time. She appears well-developed and well-nourished. No distress.  HENT:  Head: Normocephalic and atraumatic.  Mouth/Throat: Oropharynx is clear and moist.  Eyes: Conjunctivae are normal. Right eye exhibits no discharge. Left eye exhibits no discharge.  Neck: Normal range of motion.  Cardiovascular: Normal rate.  Pulmonary/Chest: No respiratory distress.  Musculoskeletal: Normal range of motion. She exhibits no deformity.  Examination of the left lower extremity shows a 2 cm abrasion that has mild erythema and mild that is superficial.  There is no effusion.  She has full range of motion with flexion and extension of the left knee.  She is able to straight leg raise.  Abrasion is along the superior medial aspect of the patella.  There is no quads tendon defect or patellar tendon defect.  Knee is stable with valgus and varus stress testing.  She has mild tenderness to touch.  There is no fluctuance or induration.  Neurological: She is alert and oriented to person, place, and time. She has normal reflexes.  Skin: Skin is warm and dry.  Psychiatric: She has a normal mood and affect. Her behavior is normal. Thought content normal.     ED Treatments / Results  Labs (all labs ordered are listed, but only abnormal results are displayed) Labs Reviewed - No data to display  EKG  EKG Interpretation None       Radiology No results  found.  Procedures Procedures (including critical care time)  Medications Ordered in ED Medications  sulfamethoxazole-trimethoprim (BACTRIM DS,SEPTRA DS) 800-160 MG per tablet 1 tablet (not administered)     Initial Impression / Assessment and Plan / ED Course  I have reviewed the triage vital signs and the nursing notes.  Pertinent labs & imaging results that were available during my care of the patient were reviewed by me and considered in my medical decision making (see chart for details).     21 year old female with abrasion and mild superficial cellulitis to the left knee.  No sign of septic joint.  Patient has no effusion and full active and passive range of motion of the left knee.  I do not believe the patient suffered a  fracture due to having no pain in the first 2 days after the accident.  Pain is most likely coming from superficial cellulitis.  She will start ibuprofen, Bactrim, rest ice and elevate the knee.  She is given a note off from work tomorrow.  She is educated on signs and symptoms return to the ED for.  Final Clinical Impressions(s) / ED Diagnoses   Final diagnoses:  Acute pain of left knee  Cellulitis of left knee  Abrasion, left knee, initial encounter    ED Discharge Orders        Ordered    sulfamethoxazole-trimethoprim (BACTRIM DS,SEPTRA DS) 800-160 MG tablet  2 times daily     11/13/17 2304    ibuprofen (ADVIL,MOTRIN) 800 MG tablet  Every 8 hours PRN     11/13/17 2304       Evon SlackGaines, Thomas C, PA-C 11/13/17 2311    Phineas SemenGoodman, Graydon, MD 11/13/17 2315

## 2017-11-13 NOTE — ED Triage Notes (Signed)
Pt his knee at work 3 days ago and didn't think anything about it, but discovered her skin is split at the impact point and tonight has a serous discharge with some reddening, but site does not feel warm to touch.  She has increased pain and she is starting to have trouble walking.  Pt in NAD at this time.

## 2017-11-13 NOTE — Discharge Instructions (Signed)
Please keep the left knee abrasion clean and dry.  Apply antibiotic ointment daily.  Take antibiotics as prescribed.  Rest ice and elevate the left knee.  Follow-up with orthopedics if no improvement in 5-7 days.  Return to the emergency department for any increase in pain, swelling redness or drainage.

## 2017-12-11 ENCOUNTER — Ambulatory Visit: Payer: Medicaid Other | Admitting: Advanced Practice Midwife

## 2017-12-22 NOTE — L&D Delivery Note (Signed)
Delivery N

## 2017-12-22 NOTE — L&D Delivery Note (Signed)
Operative Delivery Note Verbal consent: obtained from patient.  Risks and benefits discussed in brief because of fetal bradycardia.  Risks include, but are not limited to the risks of anesthesia, bleeding, infection, damage to maternal tissues, fetal cephalhematoma.  There is also the risk of inability to effect vaginal delivery of the head, or shoulder dystocia that cannot be resolved by established maneuvers, leading to the need for emergency cesarean section.  At 9:44 AM a viable and healthy boy "Jackelyn Hoehn" was delivered via Vaginal, Vacuum (Extractor) (Presentation: OA ).  APGAR: 7,9 ; weight pending .   Placenta status: spontaneous.  Cord: 3vc with the following complications: none.  Cord pH: pending  Anesthesia:  epidural Episiotomy:  none Lacerations:  none Suture Repair: n/a Est. Blood Loss (mL):  100  Mom to postpartum.  Baby to Couplet care / Skin to Skin.  22yo [id number] at 40+2wks admitted with contractions and found to have late decels on admission and 6cm cervix. She was augmented with AROM for clear fluid and pitocin given. She received an epidural and entered the second stage of labor. She pushed over an intact perineum and FHT dropped to the 60s. They did not resolved and vacuum applied. Three pulls with no popoffs, and the FHT rose to the 90s. Vacuum released, and she pushed again for delivery. No tears, minimal bleeding. Postpartum pitocin was given.  Baby placed on maternal abdomen and vigorous and crying. Cord clamped and cut, placenta delivered spontaneously intact. Mother and baby tolerated the procedure well.  Christeen Douglas 09/23/2018, 11:45 PM

## 2018-01-03 ENCOUNTER — Other Ambulatory Visit: Payer: Self-pay

## 2018-01-03 ENCOUNTER — Encounter: Payer: Self-pay | Admitting: Emergency Medicine

## 2018-01-03 ENCOUNTER — Emergency Department
Admission: EM | Admit: 2018-01-03 | Discharge: 2018-01-03 | Disposition: A | Discharge status: Home / Self Care | Payer: Medicaid Other | Attending: Emergency Medicine | Admitting: Emergency Medicine

## 2018-01-03 DIAGNOSIS — I10 Essential (primary) hypertension: Secondary | ICD-10-CM | POA: Diagnosis not present

## 2018-01-03 DIAGNOSIS — J45909 Unspecified asthma, uncomplicated: Secondary | ICD-10-CM | POA: Diagnosis not present

## 2018-01-03 DIAGNOSIS — B9789 Other viral agents as the cause of diseases classified elsewhere: Secondary | ICD-10-CM | POA: Diagnosis not present

## 2018-01-03 DIAGNOSIS — Z87891 Personal history of nicotine dependence: Secondary | ICD-10-CM | POA: Insufficient documentation

## 2018-01-03 DIAGNOSIS — J069 Acute upper respiratory infection, unspecified: Secondary | ICD-10-CM | POA: Insufficient documentation

## 2018-01-03 DIAGNOSIS — Z79899 Other long term (current) drug therapy: Secondary | ICD-10-CM | POA: Insufficient documentation

## 2018-01-03 DIAGNOSIS — R05 Cough: Secondary | ICD-10-CM | POA: Diagnosis present

## 2018-01-03 MED ORDER — METHYLPREDNISOLONE 4 MG PO TBPK: ORAL_TABLET | ORAL | 0 refills | Status: DC

## 2018-01-03 MED ORDER — PSEUDOEPH-BROMPHEN-DM 30-2-10 MG/5ML PO SYRP: 5.0000 mL | ORAL_SOLUTION | Freq: Four times a day (QID) | ORAL | 0 refills | Status: DC | PRN

## 2018-01-03 NOTE — ED Notes (Signed)

## 2018-01-03 NOTE — ED Provider Notes (Signed)
[facility] Emergency Department Provider Note   ____________________________________________   First MD Initiated Contact with Patient 01/03/18 1512     (approximate)  I have reviewed the triage vital signs and the nursing notes.   HISTORY  Chief Complaint Cough    HPI Nancy Ball is a 22 y.o. female is complaining 1 week of nasal congestion, and mild wheezing.  Patient states cough is worse at night.  Intermitting productive cough.  No pulses measured for complaint.  Patient denies fever, nausea/vomiting, or diarrhea.  No pelvis measured for complaint.  Patient denies pain.  Past Medical History:  Diagnosis Date  . Anemia during pregnancy   . 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 Asthma    well controlled, last episode in high school with running track  . Depression    PP depression, on zoloft presently  . Hypertension   . Postpartum depression     Patient Active Problem List   Diagnosis Date Noted  . History of depression 08/06/2017  . Postpartum care following vaginal delivery 07/31/2017  . Anemia complicating pregnancy, third trimester 06/03/2017  . History of pre-eclampsia 02/23/2017  . Asthma 02/23/2017    Past Surgical History:  Procedure Laterality Date  . WISDOM TOOTH EXTRACTION Bilateral 2013   states she was put to sleep    Prior to Admission medications   Medication Sig Start Date End Date Taking? Authorizing Provider  acetaminophen (TYLENOL) 325 MG tablet Take 325 mg by mouth every 4 (four) hours as needed. Reported on 06/23/2016    [provider]  benzonatate (TESSALON PERLES) 100 MG capsule Take 1-2 tabs TID prn cough 11/03/17   Menshew, Charlesetta Ivory, PA-C  brompheniramine-pseudoephedrine-DM 30-2-10 MG/5ML syrup Take 5 mLs by mouth 4 (four) times daily as needed. 01/03/18   Joni Reining, PA-C  cyclobenzaprine (FLEXERIL) 10 MG tablet Take 1 tablet (10  mg total) by mouth every 8 (eight) hours as needed for muscle spasms. 05/27/17   Nadara Mustard, MD  fluticasone (FLONASE) 50 MCG/ACT nasal spray Place 2 sprays daily into both nostrils. 11/03/17   Menshew, Charlesetta Ivory, PA-C  HYDROcodone-acetaminophen (NORCO/VICODIN) 5-325 MG tablet Take 1 tablet by mouth every 6 (six) hours as needed (breakthrough pain). 07/31/17   Tresea Mall, CNM  ibuprofen (ADVIL,MOTRIN) 800 MG tablet Take 1 tablet (800 mg total) by mouth every 8 (eight) hours as needed. 11/13/17   Evon Slack, PA-C  methylPREDNISolone (MEDROL DOSEPAK) 4 MG TBPK tablet Take Tapered dose as directed 01/03/18   Joni Reining, PA-C  Prenatal Vit-Fe Fumarate-FA (MULTIVITAMIN-PRENATAL) 27-0.8 MG TABS tablet Take 1 tablet by mouth daily at 12 noon.    [provider]  sulfamethoxazole-trimethoprim (BACTRIM DS,SEPTRA DS) 800-160 MG tablet Take 1 tablet by mouth 2 (two) times daily. 11/13/17   Evon Slack, PA-C    Allergies Shellfish allergy and Excedrin migraine [aspirin-acetaminophen-caffeine]  Family History  Problem Relation Age of Onset  . Cancer Paternal Aunt     Social History Social History   Tobacco Use  . Smoking status: Former Smoker    Packs/day: 0.50    Years: 2.00    Pack years: 1.00    Types: Cigarettes    Last attempt to quit: 12/05/2016    Years since quitting: 1.0  . Smokeless tobacco: Never Used  Substance Use Topics  . Alcohol use: No  . Drug use: No    Review  of Systems Constitutional: No fever/chills Eyes: No visual changes. ENT: No sore throat. Cardiovascular: Denies chest pain. Respiratory: Denies shortness of breath.  Cough Gastrointestinal: No abdominal pain.  No nausea, no vomiting.  No diarrhea.  No constipation. Genitourinary: Negative for dysuria. Musculoskeletal: Negative for back pain. Skin: Negative for rash. Neurological: Negative for headaches, focal weakness or  numbness. Psychiatric:Depression Allergic/Immunilogical: Shellfish and Excedrin ____________________________________________   PHYSICAL EXAM:  VITAL SIGNS: ED Triage Vitals  Enc Vitals Group     BP 01/03/18 1444 130/81     Pulse Rate 01/03/18 1444 62     Resp 01/03/18 1444 16     Temp 01/03/18 1444 98 F (36.7 C)     Temp Source 01/03/18 1444 Oral     SpO2 01/03/18 1444 100 %     Weight 01/03/18 1442 135 lb (61.2 kg)     Height 01/03/18 1442 5\' 1"  (1.549 m)     Head Circumference --      Peak Flow --      Pain Score 01/03/18 1442 0     Pain Loc --      Pain Edu? --      Excl. in GC? --    Constitutional: Alert and oriented. Well appearing and in no acute distress. Nose: Edematous nasal turbinates clear rhinorrhea Mouth/Throat: Mucous membranes are moist.  Oropharynx non-erythematous.  Postnasal drainage Neck: No stridor.  Cardiovascular: Normal rate, regular rhythm. Grossly normal heart sounds.  Good peripheral circulation. Respiratory: Normal respiratory effort.  No retractions. Lungs inspiratory rales  Neurologic:  Normal speech and language. No gross focal neurologic deficits are appreciated. No gait instability. Skin:  Skin is warm, dry and intact. No rash noted. Psychiatric: Mood and affect are normal. Speech and behavior are normal.  ____________________________________________   LABS (all labs ordered are listed, but only abnormal results are displayed)  Labs Reviewed - No data to display ____________________________________________  EKG   ____________________________________________  RADIOLOGY  No results found.  ____________________________________________   PROCEDURES  Procedure(s) performed:   Procedures  Critical Care performed: No  ____________________________________________   INITIAL IMPRESSION / ASSESSMENT AND PLAN / ED COURSE  As part of my medical decision making, I reviewed the following data within the electronic medical  record:     Cough secondary to viral respiratory infection and bronchospasms.  Patient given discharge care instruction advised take medication as directed.  [facility].      ____________________________________________   FINAL CLINICAL IMPRESSION(S) / ED DIAGNOSES  Final diagnoses:  Viral URI with cough     ED Discharge Orders        Ordered    brompheniramine-pseudoephedrine-DM 30-2-10 MG/5ML syrup  4 times daily PRN     01/03/18 1518    methylPREDNISolone (MEDROL DOSEPAK) 4 MG TBPK tablet     01/03/18 1518       Note:  This document was prepared using Dragon voice recognition software and may include unintentional dictation errors.    Joni ReiningSmith, Rhealyn Cullen K, PA-C 01/03/18 1526    Sharman CheekStafford, Phillip, MD 01/05/18 0010

## 2018-01-03 NOTE — ED Triage Notes (Signed)
Arrives with C/O cough x 1 week. Denies fever.  No SOB/DOE.  Skin warm and dry. NAD

## 2018-02-28 ENCOUNTER — Emergency Department
Admission: EM | Admit: 2018-02-28 | Discharge: 2018-02-28 | Disposition: A | Discharge status: Home / Self Care | Payer: Medicaid Other | Attending: Emergency Medicine | Admitting: Emergency Medicine

## 2018-02-28 ENCOUNTER — Other Ambulatory Visit: Payer: Self-pay

## 2018-02-28 DIAGNOSIS — O2341 Unspecified infection of urinary tract in pregnancy, first trimester: Secondary | ICD-10-CM | POA: Insufficient documentation

## 2018-02-28 DIAGNOSIS — J45909 Unspecified asthma, uncomplicated: Secondary | ICD-10-CM | POA: Diagnosis not present

## 2018-02-28 DIAGNOSIS — Z79899 Other long term (current) drug therapy: Secondary | ICD-10-CM | POA: Insufficient documentation

## 2018-02-28 DIAGNOSIS — O26891 Other specified pregnancy related conditions, first trimester: Secondary | ICD-10-CM | POA: Diagnosis present

## 2018-02-28 DIAGNOSIS — Z3A11 11 weeks gestation of pregnancy: Secondary | ICD-10-CM | POA: Insufficient documentation

## 2018-02-28 DIAGNOSIS — O10011 Pre-existing essential hypertension complicating pregnancy, first trimester: Secondary | ICD-10-CM | POA: Diagnosis not present

## 2018-02-28 DIAGNOSIS — Z87891 Personal history of nicotine dependence: Secondary | ICD-10-CM | POA: Insufficient documentation

## 2018-02-28 DIAGNOSIS — Z3491 Encounter for supervision of normal pregnancy, unspecified, first trimester: Secondary | ICD-10-CM

## 2018-02-28 DIAGNOSIS — O99511 Diseases of the respiratory system complicating pregnancy, first trimester: Secondary | ICD-10-CM | POA: Insufficient documentation

## 2018-02-28 DIAGNOSIS — N390 Urinary tract infection, site not specified: Secondary | ICD-10-CM

## 2018-02-28 LAB — URINALYSIS, COMPLETE (UACMP) WITH MICROSCOPIC
BACTERIA UA: NONE SEEN
Bilirubin Urine: NEGATIVE
GLUCOSE, UA: NEGATIVE mg/dL
Hgb urine dipstick: NEGATIVE
Ketones, ur: NEGATIVE mg/dL
Nitrite: NEGATIVE
PH: 6 (ref 5.0–8.0)
Protein, ur: NEGATIVE mg/dL
SPECIFIC GRAVITY, URINE: 1.013 (ref 1.005–1.030)

## 2018-02-28 LAB — POCT PREGNANCY, URINE: Preg Test, Ur: POSITIVE — AB

## 2018-02-28 MED ORDER — CEPHALEXIN 500 MG PO CAPS: 500.0000 mg | ORAL_CAPSULE | Freq: Two times a day (BID) | ORAL | 0 refills | Status: AC

## 2018-02-28 NOTE — ED Provider Notes (Signed)
[facility] Emergency Department Provider Note  ____________________________________________  Time seen: Approximately 8:01 PM  I have reviewed the triage vital signs and the nursing notes.   HISTORY  Chief Complaint Urinary Tract Infection    HPI Nancy Ball is a 22 y.o. female that presents emergency department for evaluation of urinary frequency and dysuria for 3 days.  Patient states that she has had urinary tract infections in the past and this feels the same. She is [redacted] weeks pregnant. This is her fourth pregnancy and has no pregnancy concerns at this time. She has an appointment with ob/gyn on March 18th. She had some minimal cramping earlier in the pregnancy, but none recent. She had this same sensation with her previous pregnancies. She denies fever, nausea, vomiting, back pain vaginal bleeding.  Past Medical History:  Diagnosis Date  . Anemia during pregnancy   . 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 Asthma    well controlled, last episode in high school with running track  . Depression    PP depression, on zoloft presently  . Hypertension   . Postpartum depression     Patient Active Problem List   Diagnosis Date Noted  . History of depression 08/06/2017  . Postpartum care following vaginal delivery 07/31/2017  . Anemia complicating pregnancy, third trimester 06/03/2017  . History of pre-eclampsia 02/23/2017  . Asthma 02/23/2017    Past Surgical History:  Procedure Laterality Date  . WISDOM TOOTH EXTRACTION Bilateral 2013   states she was put to sleep    Prior to Admission medications   Medication Sig Start Date End Date Taking? Authorizing Provider  acetaminophen (TYLENOL) 325 MG tablet Take 325 mg by mouth every 4 (four) hours as needed. Reported on 06/23/2016    [provider]  benzonatate (TESSALON PERLES) 100 MG capsule Take 1-2 tabs TID prn cough 11/03/17    Menshew, Charlesetta Ivory, PA-C  brompheniramine-pseudoephedrine-DM 30-2-10 MG/5ML syrup Take 5 mLs by mouth 4 (four) times daily as needed. 01/03/18   Joni Reining, PA-C  cephALEXin (KEFLEX) 500 MG capsule Take 1 capsule (500 mg total) by mouth 2 (two) times daily for 10 days. 02/28/18 03/10/18  Enid Derry, PA-C  cyclobenzaprine (FLEXERIL) 10 MG tablet Take 1 tablet (10 mg total) by mouth every 8 (eight) hours as needed for muscle spasms. 05/27/17   Nadara Mustard, MD  fluticasone (FLONASE) 50 MCG/ACT nasal spray Place 2 sprays daily into both nostrils. 11/03/17   Menshew, Charlesetta Ivory, PA-C  HYDROcodone-acetaminophen (NORCO/VICODIN) 5-325 MG tablet Take 1 tablet by mouth every 6 (six) hours as needed (breakthrough pain). 07/31/17   Tresea Mall, CNM  ibuprofen (ADVIL,MOTRIN) 800 MG tablet Take 1 tablet (800 mg total) by mouth every 8 (eight) hours as needed. 11/13/17   Evon Slack, PA-C  methylPREDNISolone (MEDROL DOSEPAK) 4 MG TBPK tablet Take Tapered dose as directed 01/03/18   Joni Reining, PA-C  Prenatal Vit-Fe Fumarate-FA (MULTIVITAMIN-PRENATAL) 27-0.8 MG TABS tablet Take 1 tablet by mouth daily at 12 noon.    [provider]  sulfamethoxazole-trimethoprim (BACTRIM DS,SEPTRA DS) 800-160 MG tablet Take 1 tablet by mouth 2 (two) times daily. 11/13/17   Evon Slack, PA-C    Allergies Shellfish allergy and Excedrin migraine [aspirin-acetaminophen-caffeine]  Family History  Problem Relation Age of Onset  . Cancer Paternal Aunt     Social History Social History   Tobacco Use  . Smoking status:  Former Smoker    Packs/day: 0.50    Years: 2.00    Pack years: 1.00    Types: Cigarettes    Last attempt to quit: 12/05/2016    Years since quitting: 1.2  . Smokeless tobacco: Never Used  Substance Use Topics  . Alcohol use: No  . Drug use: No     Review of Systems  Constitutional: No fever/chills Cardiovascular: No chest pain. Respiratory: No  SOB. Gastrointestinal: No nausea, no vomiting.  Musculoskeletal: Negative for musculoskeletal pain. Skin: Negative for rash, abrasions, lacerations, ecchymosis.   ____________________________________________   PHYSICAL EXAM:  VITAL SIGNS: ED Triage Vitals  Enc Vitals Group     BP 02/28/18 1817 115/61     Pulse Rate 02/28/18 1817 (!) 58     Resp 02/28/18 1817 16     Temp 02/28/18 1817 98.5 F (36.9 C)     Temp Source 02/28/18 1817 Oral     SpO2 02/28/18 1817 98 %     Weight 02/28/18 1818 135 lb (61.2 kg)     Height 02/28/18 1818 4\' 11"  (1.499 m)     Head Circumference --      Peak Flow --      Pain Score 02/28/18 1825 6     Pain Loc --      Pain Edu? --      Excl. in GC? --      Constitutional: Alert and oriented. Well appearing and in no acute distress. Eyes: Conjunctivae are normal. PERRL. EOMI. Head: Atraumatic. ENT:      Ears:      Nose: No congestion/rhinnorhea.      Mouth/Throat: Mucous membranes are moist.  Neck: No stridor.   Cardiovascular: Normal rate, regular rhythm.  Good peripheral circulation. Respiratory: Normal respiratory effort without tachypnea or retractions. Lungs CTAB. Good air entry to the bases with no decreased or absent breath sounds. Gastrointestinal: Bowel sounds 4 quadrants. Soft and nontender to palpation. No guarding or rigidity. No palpable masses. No distention. No CVA tenderness. Musculoskeletal: Full range of motion to all extremities. No gross deformities appreciated. Neurologic:  Normal speech and language. No gross focal neurologic deficits are appreciated.  Skin:  Skin is warm, dry and intact. No rash noted. Psychiatric: Mood and affect are normal. Speech and behavior are normal. Patient exhibits appropriate insight and judgement.   ____________________________________________   LABS (all labs ordered are listed, but only abnormal results are displayed)  Labs Reviewed  URINALYSIS, COMPLETE (UACMP) WITH MICROSCOPIC -  Abnormal; Notable for the following components:      Result Value   Color, Urine YELLOW (*)    APPearance CLOUDY (*)    Leukocytes, UA LARGE (*)    Squamous Epithelial / LPF 6-30 (*)    All other components within normal limits  POCT PREGNANCY, URINE - Abnormal; Notable for the following components:   Preg Test, Ur POSITIVE (*)    All other components within normal limits  POC URINE PREG, ED   ____________________________________________  EKG   ____________________________________________  RADIOLOGY   No results found.  ____________________________________________    PROCEDURES  Procedure(s) performed:    Procedures    Medications - No data to display   ____________________________________________   INITIAL IMPRESSION / ASSESSMENT AND PLAN / ED COURSE  Pertinent labs & imaging results that were available during my care of the patient were reviewed by me and considered in my medical decision making (see chart for details).  Review of the [place] CSRS was performed  in accordance of the [facility] prior to dispensing any controlled drugs.   Patient's diagnosis is consistent with UTI in pregnancy. Urinalysis and symptoms are consistent with infection. Patient is to follow up with Ob/gyn as directed. She has an appointment on the 18th. Patient will be discharged home with prescriptions for keflex. No pregnancy concerns. Patient is given ED precautions to return to the ED for any worsening or new symptoms.     ____________________________________________  FINAL CLINICAL IMPRESSION(S) / ED DIAGNOSES  Final diagnoses:  Lower urinary tract infectious disease  First trimester pregnancy      NEW MEDICATIONS STARTED DURING THIS VISIT:  ED Discharge Orders        Ordered    cephALEXin (KEFLEX) 500 MG capsule  2 times daily     02/28/18 2035          This chart was dictated using voice recognition software/Dragon. Despite best efforts to proofread, errors can  occur which can change the meaning. Any change was purely unintentional.    Enid DerryWagner, Jathniel Smeltzer, PA-C 02/28/18 2333    Nita SickleVeronese, Hagerman, MD 03/03/18 [phone]

## 2018-02-28 NOTE — ED Triage Notes (Signed)
Pt states burning and frequency with urination x 3 days. Denies fever. Alert, oriented, ambulatory. States [redacted] weeks pregnant. No pregnancy complaints. No distress noted.

## 2018-02-28 NOTE — ED Notes (Signed)

## 2018-02-28 NOTE — ED Notes (Signed)
FIRST NURSE NOTE:  Pt states "I think I have a UTI"

## 2018-04-09 ENCOUNTER — Other Ambulatory Visit: Payer: Self-pay

## 2018-04-09 ENCOUNTER — Encounter: Payer: Self-pay | Admitting: Emergency Medicine

## 2018-04-09 DIAGNOSIS — Z3A16 16 weeks gestation of pregnancy: Secondary | ICD-10-CM | POA: Diagnosis not present

## 2018-04-09 DIAGNOSIS — M109 Gout, unspecified: Secondary | ICD-10-CM | POA: Insufficient documentation

## 2018-04-09 DIAGNOSIS — J45909 Unspecified asthma, uncomplicated: Secondary | ICD-10-CM | POA: Diagnosis not present

## 2018-04-09 DIAGNOSIS — O9989 Other specified diseases and conditions complicating pregnancy, childbirth and the puerperium: Secondary | ICD-10-CM | POA: Insufficient documentation

## 2018-04-09 DIAGNOSIS — Z87891 Personal history of nicotine dependence: Secondary | ICD-10-CM | POA: Diagnosis not present

## 2018-04-09 DIAGNOSIS — I10 Essential (primary) hypertension: Secondary | ICD-10-CM | POA: Diagnosis not present

## 2018-04-09 DIAGNOSIS — Z79899 Other long term (current) drug therapy: Secondary | ICD-10-CM | POA: Diagnosis not present

## 2018-04-09 DIAGNOSIS — M79674 Pain in right toe(s): Secondary | ICD-10-CM | POA: Diagnosis present

## 2018-04-09 NOTE — ED Triage Notes (Signed)
Pt arrives ambulatory to triage with c/o gout during pregnancy. Pt reports that the pain is unbearable at this time. Pt is in NAD.

## 2018-04-09 NOTE — ED Notes (Signed)
Pt states that the "main thing is if I could have a doctors note that states that I need a chair to sit down at work".

## 2018-04-10 ENCOUNTER — Emergency Department
Admission: EM | Admit: 2018-04-10 | Discharge: 2018-04-10 | Disposition: A | Discharge status: Home / Self Care | Payer: Medicaid Other | Attending: Emergency Medicine | Admitting: Emergency Medicine

## 2018-04-10 DIAGNOSIS — M109 Gout, unspecified: Secondary | ICD-10-CM

## 2018-04-10 DIAGNOSIS — Z3A16 16 weeks gestation of pregnancy: Secondary | ICD-10-CM

## 2018-04-10 MED ORDER — HYDROCODONE-ACETAMINOPHEN 5-325 MG PO TABS
1.0000 | ORAL_TABLET | Freq: Once | ORAL | Status: AC
  Administered 2018-04-10: 1 via ORAL
  Filled 2018-04-10: qty 1

## 2018-04-10 MED ORDER — HYDROCODONE-ACETAMINOPHEN 5-325 MG PO TABS: 1.0000 | ORAL_TABLET | Freq: Four times a day (QID) | ORAL | 0 refills | Status: DC | PRN

## 2018-04-10 NOTE — ED Provider Notes (Signed)
[facility] Emergency Department Provider Note   ____________________________________________   First MD Initiated Contact with Patient 04/10/18 0153     (approximate)  I have reviewed the triage vital signs and the nursing notes.   HISTORY  Chief Complaint Gout    HPI Nancy Ball is a 22 y.o. female  [phone] at 4516 weeks of pregnancy who presents to the ED from home with a chief complaint of gouty flareup.  Patient states she has a history of gout during her previous 3 pregnancies, thought secondary to her diet.  Complains of right great toe pain.  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  Denies fever, chills, chest pain, shortness of breath, abdominal pain, nausea, vomiting, vaginal bleeding or dysuria.   Past Medical History:  Diagnosis Date  . Anemia during pregnancy   . 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 Asthma    well controlled, last episode in high school with running track  . Depression    PP depression, on zoloft presently  . Hypertension   . Postpartum depression     Patient Active Problem List   Diagnosis Date Noted  . History of depression 08/06/2017  . Postpartum care following vaginal delivery 07/31/2017  . Anemia complicating pregnancy, third trimester 06/03/2017  . History of pre-eclampsia 02/23/2017  . Asthma 02/23/2017    Past Surgical History:  Procedure Laterality Date  . WISDOM TOOTH EXTRACTION Bilateral 2013   states she was put to sleep    Prior to Admission medications   Medication Sig Start Date End Date Taking? Authorizing Provider  acetaminophen (TYLENOL) 325 MG tablet Take 325 mg by mouth every 4 (four) hours as needed. Reported on 06/23/2016    [provider]  benzonatate (TESSALON PERLES) 100 MG capsule Take 1-2 tabs TID prn cough 11/03/17   Menshew, Charlesetta IvoryJenise V Bacon, PA-C  brompheniramine-pseudoephedrine-DM  30-2-10 MG/5ML syrup Take 5 mLs by mouth 4 (four) times daily as needed. 01/03/18   Joni ReiningSmith, Ronald K, PA-C  cyclobenzaprine (FLEXERIL) 10 MG tablet Take 1 tablet (10 mg total) by mouth every 8 (eight) hours as needed for muscle spasms. 05/27/17   Nadara MustardHarris, Robert P, MD  fluticasone (FLONASE) 50 MCG/ACT nasal spray Place 2 sprays daily into both nostrils. 11/03/17   Menshew, Charlesetta IvoryJenise V Bacon, PA-C  HYDROcodone-acetaminophen (NORCO/VICODIN) 5-325 MG tablet Take 1 tablet by mouth every 6 (six) hours as needed (breakthrough pain). 07/31/17   Tresea MallGledhill, Jane, CNM  ibuprofen (ADVIL,MOTRIN) 800 MG tablet Take 1 tablet (800 mg total) by mouth every 8 (eight) hours as needed. 11/13/17   Evon SlackGaines, Thomas C, PA-C  methylPREDNISolone (MEDROL DOSEPAK) 4 MG TBPK tablet Take Tapered dose as directed 01/03/18   Joni ReiningSmith, Ronald K, PA-C  Prenatal Vit-Fe Fumarate-FA (MULTIVITAMIN-PRENATAL) 27-0.8 MG TABS tablet Take 1 tablet by mouth daily at 12 noon.    [provider]  sulfamethoxazole-trimethoprim (BACTRIM DS,SEPTRA DS) 800-160 MG tablet Take 1 tablet by mouth 2 (two) times daily. 11/13/17   Evon SlackGaines, Thomas C, PA-C    Allergies Shellfish allergy and Excedrin migraine [aspirin-acetaminophen-caffeine]  Family History  Problem Relation Age of Onset  . Cancer Paternal Aunt     Social History Social History   Tobacco Use  . Smoking status: Former Smoker    Packs/day: 0.50    Years: 2.00    Pack years: 1.00    Types: Cigarettes  Last attempt to quit: 12/05/2016    Years since quitting: 1.3  . Smokeless tobacco: Never Used  Substance Use Topics  . Alcohol use: No  . Drug use: No    Types: Marijuana    Review of Systems  Constitutional: No fever/chills. Eyes: No visual changes. ENT: No sore throat. Cardiovascular: Denies chest pain. Respiratory: Denies shortness of breath. Gastrointestinal: No abdominal pain.  No nausea, no vomiting.  No diarrhea.  No constipation. Genitourinary: Negative for  dysuria. Musculoskeletal: Positive for right great toe pain.  Negative for back pain. Skin: Negative for rash. Neurological: Negative for headaches, focal weakness or numbness.   ____________________________________________   PHYSICAL EXAM:  VITAL SIGNS: ED Triage Vitals [04/09/18 2313]  Enc Vitals Group     BP      Pulse      Resp      Temp      Temp src      SpO2      Weight 145 lb (65.8 kg)     Height 4\' 11"  (1.499 m)     Head Circumference      Peak Flow      Pain Score 10     Pain Loc      Pain Edu?      Excl. in GC?     Constitutional: Alert and oriented. Well appearing and in no acute distress. Eyes: Conjunctivae are normal. PERRL. EOMI. Head: Atraumatic. Nose: No congestion/rhinnorhea. Mouth/Throat: Mucous membranes are moist.  Oropharynx non-erythematous. Neck: No stridor.   Cardiovascular: Normal rate, regular rhythm. Grossly normal heart sounds.  Good peripheral circulation. Respiratory: Normal respiratory effort.  No retractions. Lungs CTAB. Gastrointestinal: Gravid.  Soft and nontender. No distention. No abdominal bruits. No CVA tenderness. Musculoskeletal:  Right first metatarsal base tender to palpation.  No overt reddening or warmth.  No pedal edema.  No joint effusions.  2+ distal pulses.  Brisk, less than 5-second capillary refill. Neurologic:  Normal speech and language. No gross focal neurologic deficits are appreciated.  Skin:  Skin is warm, dry and intact. No rash noted. Psychiatric: Mood and affect are normal. Speech and behavior are normal.  ____________________________________________   LABS (all labs ordered are listed, but only abnormal results are displayed)  Labs Reviewed - No data to display ____________________________________________  EKG  None ____________________________________________  RADIOLOGY  ED MD interpretation: None  Official radiology report(s): No results  found.  ____________________________________________   PROCEDURES  Procedure(s) performed: None  Procedures  Critical Care performed: No  ____________________________________________   INITIAL IMPRESSION / ASSESSMENT AND PLAN / ED COURSE  As part of my medical decision making, I reviewed the following data within the electronic MEDICAL RECORD NUMBER Nursing notes reviewed and incorporated, Old chart reviewed and Notes from prior ED visits   22 year old female approximately [redacted] weeks pregnant who presents with gout flareup of her right metatarsal joint.  Fetal heart rate 160s.  Encouraged use of mainly Tylenol, but will write limited prescription for Norco to take as needed for more severe pain.  Patient requesting work note limiting her standing.  Strict return precautions given.  Patient and family member verbalize understanding and agree with plan of care.      ____________________________________________   FINAL CLINICAL IMPRESSION(S) / ED DIAGNOSES  Final diagnoses:  Acute gout involving toe of right foot, unspecified cause  [redacted] weeks gestation of pregnancy     ED Discharge Orders    None       Note:  This document was prepared using Dragon voice recognition software and may include unintentional dictation errors.    Irean Hong, MD 04/10/18 [phone]

## 2018-04-10 NOTE — Discharge Instructions (Signed)
1.  You may take Norco (#15) as needed for more severe pain. 2.  Elevate affected area as much as possible. 3.  Return to the ER for worsening symptoms, persistent vomiting, difficulty breathing or other concerns.

## 2018-04-21 ENCOUNTER — Emergency Department
Admission: EM | Admit: 2018-04-21 | Discharge: 2018-04-21 | Disposition: A | Discharge status: Home / Self Care | Payer: Medicaid Other | Attending: Emergency Medicine | Admitting: Emergency Medicine

## 2018-04-21 ENCOUNTER — Other Ambulatory Visit: Payer: Self-pay

## 2018-04-21 ENCOUNTER — Encounter: Payer: Self-pay | Admitting: Emergency Medicine

## 2018-04-21 DIAGNOSIS — G4489 Other headache syndrome: Secondary | ICD-10-CM

## 2018-04-21 DIAGNOSIS — R8281 Pyuria: Secondary | ICD-10-CM

## 2018-04-21 DIAGNOSIS — Z79899 Other long term (current) drug therapy: Secondary | ICD-10-CM | POA: Diagnosis not present

## 2018-04-21 DIAGNOSIS — Z87891 Personal history of nicotine dependence: Secondary | ICD-10-CM | POA: Insufficient documentation

## 2018-04-21 DIAGNOSIS — N390 Urinary tract infection, site not specified: Secondary | ICD-10-CM | POA: Insufficient documentation

## 2018-04-21 DIAGNOSIS — I10 Essential (primary) hypertension: Secondary | ICD-10-CM | POA: Insufficient documentation

## 2018-04-21 DIAGNOSIS — R51 Headache: Secondary | ICD-10-CM | POA: Diagnosis present

## 2018-04-21 DIAGNOSIS — J45909 Unspecified asthma, uncomplicated: Secondary | ICD-10-CM | POA: Insufficient documentation

## 2018-04-21 LAB — COMPREHENSIVE METABOLIC PANEL
ALBUMIN: 3.4 g/dL — AB (ref 3.5–5.0)
ALK PHOS: 39 U/L (ref 38–126)
ALT: 7 U/L — AB (ref 14–54)
ANION GAP: 4 — AB (ref 5–15)
AST: 11 U/L — AB (ref 15–41)
BILIRUBIN TOTAL: 0.4 mg/dL (ref 0.3–1.2)
BUN: 12 mg/dL (ref 6–20)
CALCIUM: 8.7 mg/dL — AB (ref 8.9–10.3)
CO2: 25 mmol/L (ref 22–32)
CREATININE: 0.47 mg/dL (ref 0.44–1.00)
Chloride: 106 mmol/L (ref 101–111)
GFR calc Af Amer: >60 mL/min (ref >60)
GFR calc non Af Amer: >60 mL/min (ref >60)
Glucose, Bld: 83 mg/dL (ref 65–99)
Potassium: 3.6 mmol/L (ref 3.5–5.1)
Sodium: 135 mmol/L (ref 135–145)
Total Protein: 6.4 g/dL — ABNORMAL LOW (ref 6.5–8.1)

## 2018-04-21 LAB — URINALYSIS, COMPLETE (UACMP) WITH MICROSCOPIC
BACTERIA UA: NONE SEEN
BILIRUBIN URINE: NEGATIVE
Glucose, UA: NEGATIVE mg/dL
HGB URINE DIPSTICK: NEGATIVE
Ketones, ur: NEGATIVE mg/dL
NITRITE: NEGATIVE
PH: 6 (ref 5.0–8.0)
Protein, ur: NEGATIVE mg/dL
SPECIFIC GRAVITY, URINE: 1.016 (ref 1.005–1.030)

## 2018-04-21 LAB — CBC WITH DIFFERENTIAL/PLATELET
BASOS ABS: 0 10*3/uL (ref 0–0.1)
Basophils Relative: 1 %
EOS ABS: 0.1 10*3/uL (ref 0–0.7)
EOS PCT: 1 %
HCT: 33.1 % — ABNORMAL LOW (ref 35.0–47.0)
Hemoglobin: 11.7 g/dL — ABNORMAL LOW (ref 12.0–16.0)
Lymphocytes Relative: 27 %
Lymphs Abs: 1.7 10*3/uL (ref 1.0–3.6)
MCH: 30.6 pg (ref 26.0–34.0)
MCHC: 35.2 g/dL (ref 32.0–36.0)
MCV: 86.8 fL (ref 80.0–100.0)
MONO ABS: 0.6 10*3/uL (ref 0.2–0.9)
MONOS PCT: 10 %
Neutro Abs: 3.9 10*3/uL (ref 1.4–6.5)
Neutrophils Relative %: 61 %
PLATELETS: 231 10*3/uL (ref 150–440)
RBC: 3.81 MIL/uL (ref 3.80–5.20)
RDW: 13.6 % (ref 11.5–14.5)
WBC: 6.3 10*3/uL (ref 3.6–11.0)

## 2018-04-21 LAB — PROTEIN / CREATININE RATIO, URINE
CREATININE, URINE: 104 mg/dL
Protein Creatinine Ratio: 0.11 mg/mg{Cre} (ref 0.00–0.15)
TOTAL PROTEIN, URINE: 11 mg/dL

## 2018-04-21 MED ORDER — ACETAMINOPHEN 325 MG PO TABS
650.0000 mg | ORAL_TABLET | Freq: Once | ORAL | Status: AC
  Administered 2018-04-21: 650 mg via ORAL
  Filled 2018-04-21: qty 2

## 2018-04-21 MED ORDER — SODIUM CHLORIDE 0.9 % IV BOLUS
1000.0000 mL | Freq: Once | INTRAVENOUS | Status: AC
  Administered 2018-04-21: 1000 mL via INTRAVENOUS

## 2018-04-21 MED ORDER — CEPHALEXIN 500 MG PO CAPS: 500.0000 mg | ORAL_CAPSULE | Freq: Three times a day (TID) | ORAL | 0 refills | Status: AC

## 2018-04-21 NOTE — ED Provider Notes (Addendum)
[facility] Emergency Department Provider Note  ____________________________________________   I have reviewed the triage vital signs and the nursing notes. Where available I have reviewed prior notes and, if possible and indicated, outside hospital notes.    HISTORY  Chief Complaint Headache    HPI Nancy Ball is a 22 y.o. female with a history of anemia during pregnancy arthritis, asthma, depression hypertension postpartum depression and eclampsia, who was in her fourth pregnancy, she is 18 weeks and 1 day by [facility] by prior ultrasound.  Patient states she had some problems with pregnancy during her second pregnancy but not her third pregnancy which ended last summer.  She states that she has a mild gradual onset headache that started last night.  Not thunderclap not worst headache of life, is frontal, nonradiating, patient has a history of headaches this feels similar although she had headaches like this when she had preeclampsia, she also gets headaches without this.  Patient does list an allergy to Excedrin Migraine but can tolerate Tylenol.  Took Tylenol last night which helped the headache but it is back again this morning.  States he has not been diagnosed with a history of migraines..  She denies any fever or stiff neck.  She states that she has no numbness or weakness denies any focal neurologic findings.  The pain is better when she rests, as she states that she does not think she has had very much to drink recently,  Past Medical History:  Diagnosis Date  . Anemia during pregnancy   . 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 Asthma    well controlled, last episode in high school with running track  . Depression    PP depression, on zoloft presently  . Hypertension   . Postpartum depression     Patient Active Problem List   Diagnosis Date Noted  . History of depression 08/06/2017  . Postpartum  care following vaginal delivery 07/31/2017  . Anemia complicating pregnancy, third trimester 06/03/2017  . History of pre-eclampsia 02/23/2017  . Asthma 02/23/2017    Past Surgical History:  Procedure Laterality Date  . WISDOM TOOTH EXTRACTION Bilateral 2013   states she was put to sleep    Prior to Admission medications   Medication Sig Start Date End Date Taking? Authorizing Provider  acetaminophen (TYLENOL) 325 MG tablet Take 325 mg by mouth every 4 (four) hours as needed. Reported on 06/23/2016    [provider]  benzonatate (TESSALON PERLES) 100 MG capsule Take 1-2 tabs TID prn cough 11/03/17   Menshew, Charlesetta Ivory, PA-C  brompheniramine-pseudoephedrine-DM 30-2-10 MG/5ML syrup Take 5 mLs by mouth 4 (four) times daily as needed. 01/03/18   Joni Reining, PA-C  cyclobenzaprine (FLEXERIL) 10 MG tablet Take 1 tablet (10 mg total) by mouth every 8 (eight) hours as needed for muscle spasms. 05/27/17   Nadara Mustard, MD  fluticasone (FLONASE) 50 MCG/ACT nasal spray Place 2 sprays daily into both nostrils. 11/03/17   Menshew, Charlesetta Ivory, PA-C  HYDROcodone-acetaminophen (NORCO) 5-325 MG tablet Take 1 tablet by mouth every 6 (six) hours as needed for moderate pain. 04/10/18   Irean Hong, MD  ibuprofen (ADVIL,MOTRIN) 800 MG tablet Take 1 tablet (800 mg total) by mouth every 8 (eight) hours as needed. 11/13/17   Evon Slack, PA-C  methylPREDNISolone (MEDROL DOSEPAK) 4 MG TBPK tablet Take Tapered dose as directed 01/03/18   Joni Reining,  PA-C  Prenatal Vit-Fe Fumarate-FA (MULTIVITAMIN-PRENATAL) 27-0.8 MG TABS tablet Take 1 tablet by mouth daily at 12 noon.    [provider]  sulfamethoxazole-trimethoprim (BACTRIM DS,SEPTRA DS) 800-160 MG tablet Take 1 tablet by mouth 2 (two) times daily. 11/13/17   Evon Slack, PA-C    Allergies Shellfish allergy and Excedrin migraine [aspirin-acetaminophen-caffeine]  Family History  Problem Relation Age of Onset  .  Cancer Paternal Aunt     Social History Social History   Tobacco Use  . Smoking status: Former Smoker    Packs/day: 0.50    Years: 2.00    Pack years: 1.00    Types: Cigarettes    Last attempt to quit: 12/05/2016    Years since quitting: 1.3  . Smokeless tobacco: Never Used  Substance Use Topics  . Alcohol use: No  . Drug use: No    Types: Marijuana    Review of Systems Constitutional: No fever/chills Eyes: No visual changes. ENT: No sore throat. No stiff neck no neck pain Cardiovascular: Denies chest pain. Respiratory: Denies shortness of breath. Gastrointestinal:   no vomiting.  No diarrhea.  No constipation. Genitourinary: Negative for dysuria. Musculoskeletal: Negative lower extremity swelling Skin: Negative for rash. Neurological: Negative for severe headaches, focal weakness or numbness.   ____________________________________________   PHYSICAL EXAM:  VITAL SIGNS: ED Triage Vitals  Enc Vitals Group     BP 04/21/18 0637 122/77     Pulse Rate 04/21/18 0637 71     Resp 04/21/18 0637 18     Temp --      Temp src --      SpO2 04/21/18 0637 100 %     Weight 04/21/18 0636 145 lb (65.8 kg)     Height 04/21/18 0636  (1.499 m)     Head Circumference --      Peak Flow --      Pain Score 04/21/18 0634 8     Pain Loc --      Pain Edu? --      Excl. in GC? --     Constitutional: Alert and oriented. Well appearing and in no acute distress. Eyes: Conjunctivae are normal Head: Atraumatic HEENT: No congestion/rhinnorhea. Mucous membranes are moist.  Oropharynx non-erythematous Neck:   Nontender with no meningismus, no masses, no stridor Cardiovascular: Normal rate, regular rhythm. Grossly normal heart sounds.  Good peripheral circulation. Respiratory: Normal respiratory effort.  No retractions. Lungs CTAB. Abdominal: Soft and nontender. No distention. No guarding no rebound Back:  There is no focal tenderness or step off.  there is no midline tenderness  there are no lesions noted. there is no CVA tenderness Musculoskeletal: No lower extremity tenderness, no upper extremity tenderness. No joint effusions, no DVT signs strong distal pulses no edema Neurologic: Cranial nerves II through XII are grossly intact 5 out of 5 strength bilateral upper and lower extremity. Finger to nose within normal limits heel to shin within normal limits, speech is normal with no word finding difficulty or dysarthria, reflexes symmetric, pupils are equally round and reactive to light, there is no pronator drift, sensation is normal, vision is intact to confrontation, gait is deferred, there is no nystagmus, normal neurologic exam Skin:  Skin is warm, dry and intact. No rash noted. Psychiatric: Mood and affect are normal. Speech and behavior are normal.  ____________________________________________   LABS (all labs ordered are listed, but only abnormal results are displayed)  Labs Reviewed  CBC WITH DIFFERENTIAL/PLATELET - Abnormal; Notable for the  following components:      Result Value   Hemoglobin 11.7 (*)    HCT 33.1 (*)    All other components within normal limits  URINALYSIS, COMPLETE (UACMP) WITH MICROSCOPIC - Abnormal; Notable for the following components:   Color, Urine YELLOW (*)    APPearance HAZY (*)    Leukocytes, UA SMALL (*)    All other components within normal limits  COMPREHENSIVE METABOLIC PANEL  PROTEIN / CREATININE RATIO, URINE    Pertinent labs  results that were available during my care of the patient were reviewed by me and considered in my medical decision making (see chart for details). ____________________________________________  EKG  I personally interpreted any EKGs ordered by me or triage  ____________________________________________  RADIOLOGY  Pertinent labs & imaging results that were available during my care of the patient were reviewed by me and considered in my medical decision making (see chart for details). If  possible, patient and/or family made aware of any abnormal findings.  No results found. ____________________________________________    PROCEDURES  Procedure(s) performed: None  Procedures  Critical Care performed: None  ____________________________________________   INITIAL IMPRESSION / ASSESSMENT AND PLAN / ED COURSE  Pertinent labs & imaging results that were available during my care of the patient were reviewed by me and considered in my medical decision making (see chart for details).  Patient here with a mild headache, very well-appearing.  She is 18 and 1 with normal blood pressures very low suspicion for preeclampsia, we will give her Tylenol, IV fluid, and reassess.  Low suspicion for meningitis, low suspicion for head bleed low suspicion for venous thrombosis or cavernous thrombosis low suspicion for CVA, aneurysmal bleed, sentinel bleed, etc.  ----------------------------------------- 8:51 AM on 04/21/2018 -----------------------------------------  pt in nad resting comfortably ha "about gone" after fluids and tylenol. Would like to go home. Paging her ob.  ----------------------------------------- 9:56 AM on 04/21/2018 -----------------------------------------  Discussed with Dr. Kathrynn Speed, [facility], we discussed all of her findings and her exam etc.  They do not wish any further intervention, patient herself feels 100% better wants to go home.  She remains neurologically intact with no stiff neck no headache at this time no fever,  Pt remains at neurologic baseline. At this time, there is nothing to suggest or support the diagnosis of subarachnoid hemorrhage, aneurysmal event, meningitis, tumor or mass, cavernous thrombosis, encephalitis, ischemic stroke, pseudotumor cerebri, glaucoma, temporal arteritis, or any other acute intracrania/neurological process. Extensive return precautions including but not limited to any new or worrisome symptoms such as worsening of, or  change in, headache, any neurological symptoms, fever etc.. Natural disease course discussed with patient. The need for follow-up and all of my customary return precautions have been discussed as well.  OB will follow up with her closely as an outpatient. ____________________________________________   FINAL CLINICAL IMPRESSION(S) / ED DIAGNOSES  Final diagnoses:  None      This chart was dictated using voice recognition software.  Despite best efforts to proofread,  errors can occur which can change meaning.      Jeanmarie Plant, MD 04/21/18 1610    Jeanmarie Plant, MD 04/21/18 9604    Jeanmarie Plant, MD 04/21/18 [phone]

## 2018-04-21 NOTE — ED Triage Notes (Addendum)
Patient ambulatory to triage with steady gait, without difficulty or distress noted; pt reports frontal HA and dizziness since last night; st hx of same with previous preeclampsia; awoke at 4am with increased pain; pt approx [redacted]wks pregnant

## 2018-04-21 NOTE — Discharge Instructions (Addendum)
Drink plenty of fluids, take Tylenol as needed if you have a severe headache, especially a sudden onset bad headache, numbness, weakness, fever, pain when you pee, pain in your back, stiff neck or you feel worse in any way please return to the emergency department.  Follow closely with your OB/GYN.

## 2018-04-21 NOTE — ED Notes (Signed)

## 2018-04-22 LAB — URINE CULTURE

## 2018-07-09 ENCOUNTER — Observation Stay
Admission: EM | Admit: 2018-07-09 | Discharge: 2018-07-09 | Disposition: A | Discharge status: Other Acute Inpatient Hospital | Payer: Medicaid Other | Attending: Obstetrics and Gynecology | Admitting: Obstetrics and Gynecology

## 2018-07-09 DIAGNOSIS — J45909 Unspecified asthma, uncomplicated: Secondary | ICD-10-CM | POA: Diagnosis not present

## 2018-07-09 DIAGNOSIS — Z3A29 29 weeks gestation of pregnancy: Secondary | ICD-10-CM | POA: Insufficient documentation

## 2018-07-09 DIAGNOSIS — Z79899 Other long term (current) drug therapy: Secondary | ICD-10-CM | POA: Insufficient documentation

## 2018-07-09 DIAGNOSIS — O99513 Diseases of the respiratory system complicating pregnancy, third trimester: Secondary | ICD-10-CM | POA: Insufficient documentation

## 2018-07-09 DIAGNOSIS — F329 Major depressive disorder, single episode, unspecified: Secondary | ICD-10-CM | POA: Insufficient documentation

## 2018-07-09 DIAGNOSIS — Z91013 Allergy to seafood: Secondary | ICD-10-CM | POA: Diagnosis not present

## 2018-07-09 DIAGNOSIS — Z87891 Personal history of nicotine dependence: Secondary | ICD-10-CM | POA: Insufficient documentation

## 2018-07-09 DIAGNOSIS — R262 Difficulty in walking, not elsewhere classified: Secondary | ICD-10-CM | POA: Insufficient documentation

## 2018-07-09 DIAGNOSIS — Z886 Allergy status to analgesic agent status: Secondary | ICD-10-CM | POA: Diagnosis not present

## 2018-07-09 DIAGNOSIS — M0890 Juvenile arthritis, unspecified, unspecified site: Secondary | ICD-10-CM | POA: Insufficient documentation

## 2018-07-09 DIAGNOSIS — Z3490 Encounter for supervision of normal pregnancy, unspecified, unspecified trimester: Secondary | ICD-10-CM

## 2018-07-09 DIAGNOSIS — O99343 Other mental disorders complicating pregnancy, third trimester: Secondary | ICD-10-CM | POA: Insufficient documentation

## 2018-07-09 DIAGNOSIS — R531 Weakness: Secondary | ICD-10-CM | POA: Diagnosis not present

## 2018-07-09 DIAGNOSIS — O9989 Other specified diseases and conditions complicating pregnancy, childbirth and the puerperium: Principal | ICD-10-CM | POA: Insufficient documentation

## 2018-07-09 HISTORY — DX: Encounter for supervision of normal pregnancy, unspecified, unspecified trimester: Z34.90

## 2018-07-09 LAB — COMPREHENSIVE METABOLIC PANEL
ALBUMIN: 3.1 g/dL — AB (ref 3.5–5.0)
ALK PHOS: 51 U/L (ref 38–126)
ALT: 7 U/L (ref 0–44)
AST: 14 U/L — AB (ref 15–41)
Anion gap: 6 (ref 5–15)
BILIRUBIN TOTAL: 0.4 mg/dL (ref 0.3–1.2)
BUN: 10 mg/dL (ref 6–20)
CALCIUM: 8.6 mg/dL — AB (ref 8.9–10.3)
CO2: 23 mmol/L (ref 22–32)
CREATININE: 0.44 mg/dL (ref 0.44–1.00)
Chloride: 108 mmol/L (ref 98–111)
GFR calc Af Amer: >60 mL/min (ref >60)
GLUCOSE: 93 mg/dL (ref 70–99)
Potassium: 3.3 mmol/L — ABNORMAL LOW (ref 3.5–5.1)
Sodium: 137 mmol/L (ref 135–145)
TOTAL PROTEIN: 6.3 g/dL — AB (ref 6.5–8.1)

## 2018-07-09 LAB — URINALYSIS, COMPLETE (UACMP) WITH MICROSCOPIC
Bilirubin Urine: NEGATIVE
Glucose, UA: NEGATIVE mg/dL
Hgb urine dipstick: NEGATIVE
Ketones, ur: NEGATIVE mg/dL
NITRITE: NEGATIVE
PROTEIN: 30 mg/dL — AB
SPECIFIC GRAVITY, URINE: 1.023 (ref 1.005–1.030)
pH: 7 (ref 5.0–8.0)

## 2018-07-09 LAB — CBC
HEMATOCRIT: 29.6 % — AB (ref 35.0–47.0)
HEMOGLOBIN: 10.2 g/dL — AB (ref 12.0–16.0)
MCH: 29 pg (ref 26.0–34.0)
MCHC: 34.5 g/dL (ref 32.0–36.0)
MCV: 83.9 fL (ref 80.0–100.0)
Platelets: 213 10*3/uL (ref 150–440)
RBC: 3.53 MIL/uL — ABNORMAL LOW (ref 3.80–5.20)
RDW: 13.1 % (ref 11.5–14.5)
WBC: 8.2 10*3/uL (ref 3.6–11.0)

## 2018-07-09 MED ORDER — POTASSIUM CHLORIDE 20 MEQ PO PACK
20.0000 meq | PACK | Freq: Once | ORAL | Status: AC
  Administered 2018-07-09: 20 meq via ORAL
  Filled 2018-07-09: qty 1

## 2018-07-09 MED ORDER — LACTATED RINGERS IV BOLUS
1000.0000 mL | Freq: Once | INTRAVENOUS | Status: AC
  Administered 2018-07-09: 1000 mL via INTRAVENOUS

## 2018-07-09 MED ORDER — ONDANSETRON HCL 4 MG/2ML IJ SOLN
4.0000 mg | INTRAMUSCULAR | Status: DC | PRN
  Administered 2018-07-09 (×2): 4 mg via INTRAVENOUS
  Filled 2018-07-09: qty 2

## 2018-07-09 MED ORDER — ONDANSETRON HCL 4 MG/2ML IJ SOLN
INTRAMUSCULAR | Status: AC
Start: 2018-07-09 — End: 2018-07-09
  Administered 2018-07-09: 4 mg via INTRAVENOUS
  Filled 2018-07-09: qty 2

## 2018-07-09 NOTE — Progress Notes (Addendum)
Patient ID: Nancy Ball, female   DOB: 02/10/1996, 22 y.o.   MRN: [id number]  Nancy PoppCrystal L Mazo is a 22 y.o. female. She is at 1572w3d gestation. Patient's last menstrual period was 12/15/2017. Pt is a G4P3. Pt had pre-ecclampsia pp 10 days out from one of her previous deliveries.  Estimated Date of Delivery: 09/21/18  [facility]  Chief complaint: Feel nauseated and cannot walk due to weakness starting at 2300  Pt arrived in a w/c and required full assistance to get in the bed and to get undressed. She claims to be to weak to undress. 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 no CTX, no VB.no LOF,  Active fetal movement.   Maternal Medical History:   Past Medical History:  Diagnosis Date  . Anemia during pregnancy   . 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 Asthma    well controlled, last episode in high school with running track  . Depression    PP depression, on zoloft presently  . Hypertension   . 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   Past Surgical History:  Procedure Laterality Date  . WISDOM TOOTH EXTRACTION Bilateral 2013   states she was put to sleep    Allergies  Allergen Reactions  . Shellfish Allergy Anaphylaxis  . Excedrin Migraine [Aspirin-Acetaminophen-Caffeine] Rash    Prior to Admission medications   Medication Sig Start Date End Date Taking? Authorizing Provider  acetaminophen (TYLENOL) 325 MG tablet Take 325 mg by mouth every 4 (four) hours as needed. Reported on 06/23/2016   Yes [provider]  benzonatate (TESSALON PERLES) 100 MG capsule Take 1-2 tabs TID prn cough Patient not taking: Reported on 07/09/2018 11/03/17   Menshew, Charlesetta IvoryJenise V Bacon, PA-C   brompheniramine-pseudoephedrine-DM 30-2-10 MG/5ML syrup Take 5 mLs by mouth 4 (four) times daily as needed. Patient not taking: Reported on 07/09/2018 01/03/18   Joni ReiningSmith, Ronald K, PA-C  cyclobenzaprine (FLEXERIL) 10 MG tablet Take 1 tablet (10 mg total) by mouth every 8 (eight) hours as needed for muscle spasms. Patient not taking: Reported on 07/09/2018 05/27/17   Nadara MustardHarris, Robert P, MD  fluticasone [facility](FLONASE) 50 MCG/ACT nasal spray Place 2 sprays daily into both nostrils. Patient not taking: Reported on 07/09/2018 11/03/17   Menshew, Charlesetta IvoryJenise V Bacon, PA-C  HYDROcodone-acetaminophen (NORCO) 5-325 MG tablet Take 1 tablet by mouth every 6 (six) hours as needed for moderate pain. Patient not taking: Reported on 07/09/2018 04/10/18   Irean HongSung, Jade J, MD  ibuprofen (ADVIL,MOTRIN) 800 MG tablet Take 1 tablet (800 mg total) by mouth every 8 (eight) hours as needed. Patient not taking: Reported on 07/09/2018 11/13/17   Evon SlackGaines, Thomas C, PA-C  methylPREDNISolone (MEDROL DOSEPAK) 4 MG TBPK tablet Take Tapered dose as directed Patient not taking: Reported on 07/09/2018 01/03/18   Joni ReiningSmith, Ronald K, PA-C  Prenatal Vit-Fe Fumarate-FA (MULTIVITAMIN-PRENATAL) 27-0.8 MG TABS tablet Take 1 tablet by mouth daily at 12 noon.    [provider]  sulfamethoxazole-trimethoprim (BACTRIM DS,SEPTRA DS) 800-160 MG tablet Take 1 tablet by mouth 2 (two) times daily. Patient not taking: Reported on 07/09/2018 11/13/17   Evon SlackGaines, Thomas C, PA-C     Social History: She  reports that she quit smoking about 19 months  ago. Her smoking use included cigarettes. She has a 1.00 pack-year smoking history. She has never used smokeless tobacco. She reports that she does not drink alcohol or use drugs.  Family History: family history includes Cancer in her paternal aunt. no history of gyn cancers  Review of Systems: A full review of systems was performed and negative except as noted in the HPI.   Review of Systems  Constitutional: Positive for  malaise/fatigue.  HENT: Negative.   Eyes: Negative.   Respiratory: Negative.   Cardiovascular: Negative.   Gastrointestinal: Positive for nausea.  Genitourinary: Negative.   Musculoskeletal: Positive for myalgias.  Skin: Negative.   Neurological: Positive for weakness.  Endo/Heme/Allergies: Negative.   Psychiatric/Behavioral: Positive for depression.  Prenatal labs: Type: B pos Antibody: Rubella Immune Varicella immune RPR:NR 1 h GCT:89 GBS unknown O:  BP 119/72   Pulse 74   Temp 98.3 F (36.8 C) (Oral)   Resp 16   LMP 12/15/2017  Results for orders placed or performed during the hospital encounter of 07/09/18 (from the past 48 hour(s))  Urinalysis, Complete w Microscopic   Collection Time: 07/09/18  1:34 AM  Result Value Ref Range   Color, Urine YELLOW (A) YELLOW   APPearance CLOUDY (A) CLEAR   Specific Gravity, Urine 1.023 1.005 - 1.030   pH 7.0 5.0 - 8.0   Glucose, UA NEGATIVE NEGATIVE mg/dL   Hgb urine dipstick NEGATIVE NEGATIVE   Bilirubin Urine NEGATIVE NEGATIVE   Ketones, ur NEGATIVE NEGATIVE mg/dL   Protein, ur 30 (A) NEGATIVE mg/dL   Nitrite NEGATIVE NEGATIVE   Leukocytes, UA LARGE (A) NEGATIVE   RBC / HPF 0-5 0 - 5 RBC/hpf   WBC, UA 21-50 0 - 5 WBC/hpf   Bacteria, UA FEW (A) NONE SEEN   Squamous Epithelial / LPF 11-20 0 - 5   Mucus PRESENT    Amorphous [place] PRESENT   CBC   Collection Time: 07/09/18  1:34 AM  Result Value Ref Range   WBC 8.2 3.6 - 11.0 K/uL   RBC 3.53 (L) 3.80 - 5.20 MIL/uL   Hemoglobin 10.2 (L) 12.0 - 16.0 g/dL   HCT 16.1 (L) 09.6 - 04.5 %   MCV 83.9 80.0 - 100.0 fL   MCH 29.0 26.0 - 34.0 pg   MCHC 34.5 32.0 - 36.0 g/dL   RDW 40.9 81.1 - 91.4 %   Platelets 213 150 - 440 K/uL  Comprehensive metabolic panel   Collection Time: 07/09/18  1:34 AM  Result Value Ref Range   Sodium 137 135 - 145 mmol/L   Potassium 3.3 (L) 3.5 - 5.1 mmol/L   Chloride 108 98 - 111 mmol/L   CO2 23 22 - 32 mmol/L   Glucose, Bld 93 70 - 99 mg/dL    BUN 10 6 - 20 mg/dL   Creatinine, Ser 7.82 0.44 - 1.00 mg/dL   Calcium 8.6 (L) 8.9 - 10.3 mg/dL   Total Protein 6.3 (L) 6.5 - 8.1 g/dL   Albumin 3.1 (L) 3.5 - 5.0 g/dL   AST 14 (L) 15 - 41 U/L   ALT 7 0 - 44 U/L   Alkaline Phosphatase 51 38 - 126 U/L   Total Bilirubin 0.4 0.3 - 1.2 mg/dL   GFR calc non Af Amer >60 >60 mL/min   GFR calc Af Amer >60 >60 mL/min   Anion gap 6 5 - 15     Constitutional: NAD, AAOx3  HEENT: extraocular movements grossly intact, moist mucous membranes, PERL. MAE  well.  Cranial nerves:2 to 12 intact. Lower extrems: Muscle strength: 4/4 on flexion and ext. Hands:4/4 on flexion and extension. DTR's: Rt patellar:3+/0  Lt patellar:3+/0 Rt brachial: 1+  Lt brachial 1+ no evidence of weakness in 4 extrems, When walking pt, she leans on furniture and takes steps as if she cannot walk.  CV: RRR, S1S2, No M/R/G. PULM: nl respiratory effort, CTABL    Abd: gravid, non-tender, non-distended, soft   Ext: Non-tender, Nonedmeatous   Psych: mood appropriate, speech normal, talks slowly. Answers questions appropriately.  Pelvic deferred  NST: Reactive Baseline: 150 Variability: moderate Accelerations present x >2 Decelerations rare variable noted Time   A/P: 22 y.o. 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   Fetal Wellbeing: Reassuring Cat 1 tracing.  Reactive NST   Weakness/Psychogenic illness  Will replace Potassium with supplement  [facility] and spoke to on call resident:Celeste and she advised to speak with Dr Kae Heller Attending.   ----- Myrtie Cruise, MSN, CNM, FNP Certified Nurse Midwife [facility]

## 2018-07-09 NOTE — Progress Notes (Signed)
Report of patient simultaneously given to Baxter HireKristen, [facility] and Lurena [facility]iner[facility]becca [facility] at [phone].

## 2018-07-09 NOTE — Progress Notes (Signed)
Patient ID: Nancy Ball, female   DOB: 10/19/1996, 22 y.o.   MRN: [id number] [facility] whether pt should be worked up further for psych or medical and he [facility] the [facility]. Pt informed and agreed with plan of care. Myrtie Cruisearon W. Latecia Miler,RN, MSN, CNM, FNP Certified Nurse Midwife [facility]

## 2018-07-09 NOTE — Discharge Summary (Signed)
Obstetric Discharge Summary Reason for Admission: weakness, inability to walk Prenatal Procedures: ultrasound Intrapartum Procedures: N/A Postpartum Procedures: N/A Complications-Operative and Postpartum: N/A Hemoglobin  Date Value Ref Range Status  07/09/2018 10.2 (L) 12.0 - 16.0 g/dL Final  16/10/960405/14/2018 9.7 (L) 11.1 - 15.9 g/dL Final  54/09/811905/26/2017 14.710.0 g/dL Final   HCT  Date Value Ref Range Status  07/09/2018 29.6 (L) 35.0 - 47.0 % Final  05/16/2016 29 % Final   Hematocrit  Date Value Ref Range Status  05/04/2017 29.4 (L) 34.0 - 46.6 % Final    Physical Exam:  General: alert, cooperative and appears stated age Extrems: Cannot walk, reflexes are WNL Uterus: Gravid DVT Evaluation: Neg Homans   Discharge Diagnoses: IUP at 3529 3/7 weeks with weakness and inability to walk  Discharge Information: Date: 07/09/2018 Activity: Up to BR Diet: regular  Medications:PNV,  Condition: Stable  Instructions:  [facility]    Newborn Data: This patient has no babies on file.   Nancy Ball 07/09/2018, 5:46 AM

## 2018-07-09 NOTE — OB Triage Note (Signed)
Patient came in c/o of Nausea and overall body weakness that started at 2330 tonight. [facility] Patient reports just sitting down and all of a sudden not feeling well. Denies any vomiting. Reports feeling a bit dizzy. Denies vaginal bleeding. Denies LOF. Reports positive FM.

## 2018-07-26 ENCOUNTER — Encounter: Payer: Self-pay | Admitting: Emergency Medicine

## 2018-07-26 ENCOUNTER — Other Ambulatory Visit: Payer: Self-pay

## 2018-07-26 ENCOUNTER — Emergency Department
Admission: EM | Admit: 2018-07-26 | Discharge: 2018-07-26 | Disposition: A | Discharge status: Home / Self Care | Payer: Medicaid Other | Attending: Emergency Medicine | Admitting: Emergency Medicine

## 2018-07-26 DIAGNOSIS — Z87891 Personal history of nicotine dependence: Secondary | ICD-10-CM | POA: Insufficient documentation

## 2018-07-26 DIAGNOSIS — Z79899 Other long term (current) drug therapy: Secondary | ICD-10-CM | POA: Diagnosis not present

## 2018-07-26 DIAGNOSIS — O2393 Unspecified genitourinary tract infection in pregnancy, third trimester: Secondary | ICD-10-CM | POA: Insufficient documentation

## 2018-07-26 DIAGNOSIS — O26893 Other specified pregnancy related conditions, third trimester: Secondary | ICD-10-CM | POA: Diagnosis present

## 2018-07-26 DIAGNOSIS — R52 Pain, unspecified: Secondary | ICD-10-CM | POA: Diagnosis not present

## 2018-07-26 DIAGNOSIS — Z3A31 31 weeks gestation of pregnancy: Secondary | ICD-10-CM | POA: Insufficient documentation

## 2018-07-26 DIAGNOSIS — N390 Urinary tract infection, site not specified: Secondary | ICD-10-CM

## 2018-07-26 LAB — URINALYSIS, COMPLETE (UACMP) WITH MICROSCOPIC
Bacteria, UA: NONE SEEN
Bilirubin Urine: NEGATIVE
GLUCOSE, UA: NEGATIVE mg/dL
HGB URINE DIPSTICK: NEGATIVE
KETONES UR: 20 mg/dL — AB
NITRITE: NEGATIVE
PH: 7 (ref 5.0–8.0)
Protein, ur: NEGATIVE mg/dL
SPECIFIC GRAVITY, URINE: 1.014 (ref 1.005–1.030)

## 2018-07-26 LAB — INFLUENZA PANEL BY PCR (TYPE A & B)
INFLAPCR: NEGATIVE
INFLBPCR: NEGATIVE

## 2018-07-26 MED ORDER — ACETAMINOPHEN 325 MG PO TABS
650.0000 mg | ORAL_TABLET | Freq: Once | ORAL | Status: AC
  Administered 2018-07-26: 650 mg via ORAL
  Filled 2018-07-26: qty 2

## 2018-07-26 MED ORDER — NITROFURANTOIN MONOHYD MACRO 100 MG PO CAPS: 100.0000 mg | ORAL_CAPSULE | Freq: Two times a day (BID) | ORAL | 0 refills | Status: DC

## 2018-07-26 NOTE — Discharge Instructions (Addendum)
Follow-up with your regular doctor if not better in 3-5 that he is.  Use the medication as prescribed.  Return the emergency department if worsening.  You may use over-the-counter Robitussin for your cough.

## 2018-07-26 NOTE — ED Provider Notes (Signed)
[facility] Department Provider Note  ____________________________________________   First MD Initiated Contact with Patient 07/26/18 1533     (approximate)  I have reviewed the triage vital signs and the nursing notes.   HISTORY  Chief Complaint Generalized Body Aches    HPI Nancy Ball is a 22 y.o. female seen department complaining of fever, chills and body aches.  She states she does have a dry cough.  She is [redacted] weeks pregnant.  Symptoms for 3 days with the cough and body aches.  She denies any burning with urination or vaginal discharge.  She denies any abdominal cramping or bleeding.  She states she does feel the baby move.  She is a patient at [facility] high risk pregnancy.  She states this is due to the preeclampsia she got with both previous children.    Past Medical History:  Diagnosis Date  . Anemia during pregnancy   . 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 Asthma    well controlled, last episode in high school with running track  . Depression    PP depression, on zoloft presently  . Hypertension   . Postpartum depression     Patient Active Problem List   Diagnosis Date Noted  . Pregnancy 07/09/2018  . History of depression 08/06/2017  . Postpartum care following vaginal delivery 07/31/2017  . Anemia complicating pregnancy, third trimester 06/03/2017  . History of pre-eclampsia 02/23/2017  . Asthma 02/23/2017    Past Surgical History:  Procedure Laterality Date  . WISDOM TOOTH EXTRACTION Bilateral 2013   states she was put to sleep    Prior to Admission medications   Medication Sig Start Date End Date Taking? Authorizing Provider  acetaminophen (TYLENOL) 325 MG tablet Take 325 mg by mouth every 4 (four) hours as needed. Reported on 06/23/2016    [provider]  nitrofurantoin, macrocrystal-monohydrate, (MACROBID) 100 MG capsule Take 1 capsule (100 mg total)  by mouth 2 (two) times daily. 07/26/18   Fisher, Roselyn Bering, PA-C  Prenatal Vit-Fe Fumarate-FA (MULTIVITAMIN-PRENATAL) 27-0.8 MG TABS tablet Take 1 tablet by mouth daily at 12 noon.    [provider]    Allergies Shellfish allergy and Excedrin migraine [aspirin-acetaminophen-caffeine]  Family History  Problem Relation Age of Onset  . Cancer Paternal Aunt     Social History Social History   Tobacco Use  . Smoking status: Former Smoker    Packs/day: 0.50    Years: 2.00    Pack years: 1.00    Types: Cigarettes    Last attempt to quit: 12/05/2016    Years since quitting: 1.6  . Smokeless tobacco: Never Used  Substance Use Topics  . Alcohol use: No  . Drug use: No    Types: Marijuana    Review of Systems  Constitutional: Positive chills Eyes: No visual changes. ENT: No sore throat. Respiratory: Positive cough Genitourinary: Negative for dysuria. Musculoskeletal: Negative for back pain. Skin: Negative for rash.    ____________________________________________   PHYSICAL EXAM:  VITAL SIGNS: ED Triage Vitals  Enc Vitals Group     BP 07/26/18 1420 114/70     Pulse Rate 07/26/18 1420 (!) 120     Resp 07/26/18 1420 16     Temp 07/26/18 1420 98.8 F (37.1 C)     Temp Source 07/26/18 1420 Oral     SpO2 07/26/18 1549 99 %     Weight 07/26/18 1420 135 lb (  61.2 kg)     Height 07/26/18 1420 4\' 11"  (1.499 m)     Head Circumference --      Peak Flow --      Pain Score 07/26/18 1419 8     Pain Loc --      Pain Edu? --      Excl. in GC? --     Constitutional: Alert and oriented. Well appearing and in no acute distress. Eyes: Conjunctivae are normal.  Head: Atraumatic. Ears: TMs are clear bilaterally Nose: No congestion/rhinnorhea. Mouth/Throat: Mucous membranes are moist.  Throat appears normal Neck:  supple no lymphadenopathy noted Cardiovascular: Normal rate, regular rhythm. Heart sounds are normal Respiratory: Normal respiratory effort.  No retractions,  lungs c t a, cough is dry GU: deferred Musculoskeletal: FROM all extremities, warm and well perfused Neurologic:  Normal speech and language.  Skin:  Skin is warm, dry and intact. No rash noted. Psychiatric: Mood and affect are normal. Speech and behavior are normal.  ____________________________________________   LABS (all labs ordered are listed, but only abnormal results are displayed)  Labs Reviewed  URINALYSIS, COMPLETE (UACMP) WITH MICROSCOPIC - Abnormal; Notable for the following components:      Result Value   Color, Urine YELLOW (*)    APPearance CLEAR (*)    Ketones, ur 20 (*)    Leukocytes, UA MODERATE (*)    All other components within normal limits  URINE CULTURE  INFLUENZA PANEL BY PCR (TYPE A & B)   ____________________________________________   ____________________________________________  RADIOLOGY    ____________________________________________   PROCEDURES  Procedure(s) performed: No  Procedures    ____________________________________________   INITIAL IMPRESSION / ASSESSMENT AND PLAN / ED COURSE  Pertinent labs & imaging results that were available during my care of the patient were reviewed by me and considered in my medical decision making (see chart for details).   Patient is a 22 year old female presents emergency department complaining of cough, chills and body aches for 3 days.  She is [redacted] weeks pregnant.  She denies any abdominal pain, contractions, vaginal discharge, or bleeding.  States she does feel child move.  On physical exam she appears well.  Lungs are clear to auscultation.  The remainder the exam is actually unremarkable.  Flu test is negative, UA is positive for moderate leuks  Urine culture was added to the labs  Discussed the labs with the patient.  She is given a prescription for Macrobid.  She is to follow-up with her regular doctor and also call them with concerns of the continued cough and chills.  She has any  concerns with medication she should also call her regular doctor.  She states she understands will comply.  She was discharged in stable condition     As part of my medical decision making, I reviewed the following data within the electronic MEDICAL RECORD NUMBER Nursing notes reviewed and incorporated, Labs reviewed UA is positive for moderate leuks, flu test is negative, Notes from prior ED visits and [place] Controlled Substance Database  ____________________________________________   FINAL CLINICAL IMPRESSION(S) / ED DIAGNOSES  Final diagnoses:  Urinary tract infection without hematuria, site unspecified      NEW MEDICATIONS STARTED DURING THIS VISIT:  Discharge Medication List as of 07/26/2018  4:03 PM    START taking these medications   Details  nitrofurantoin, macrocrystal-monohydrate, (MACROBID) 100 MG capsule Take 1 capsule (100 mg total) by mouth 2 (two) times daily., Starting Mon 07/26/2018, Normal  Note:  This document was prepared using Dragon voice recognition software and may include unintentional dictation errors.    Faythe Ghee, PA-C 07/26/18 1644    Jene Every, MD 07/27/18 [phone]

## 2018-07-26 NOTE — ED Triage Notes (Signed)
Cough,chills body aches for the past 3 days

## 2018-07-28 LAB — URINE CULTURE

## 2018-09-15 ENCOUNTER — Inpatient Hospital Stay
Admission: EM | Admit: 2018-09-15 | Discharge: 2018-09-16 | Disposition: A | Discharge status: Home / Self Care | Payer: Medicaid Other | Attending: Obstetrics and Gynecology | Admitting: Obstetrics and Gynecology

## 2018-09-15 DIAGNOSIS — O479 False labor, unspecified: Secondary | ICD-10-CM

## 2018-09-15 DIAGNOSIS — Z3A39 39 weeks gestation of pregnancy: Secondary | ICD-10-CM | POA: Insufficient documentation

## 2018-09-15 DIAGNOSIS — O471 False labor at or after 37 completed weeks of gestation: Secondary | ICD-10-CM | POA: Insufficient documentation

## 2018-09-16 ENCOUNTER — Other Ambulatory Visit: Payer: Self-pay

## 2018-09-16 DIAGNOSIS — Z3A39 39 weeks gestation of pregnancy: Secondary | ICD-10-CM | POA: Diagnosis not present

## 2018-09-16 DIAGNOSIS — O471 False labor at or after 37 completed weeks of gestation: Secondary | ICD-10-CM | POA: Diagnosis not present

## 2018-09-16 NOTE — OB Triage Note (Signed)
Patient given discharge instructions and verbalized understanding. Discharged ambulatory in stable condition accompanied by family.

## 2018-09-16 NOTE — OB Triage Note (Signed)
Patient arrived in triage with c/o ctxs since 1900 about every 10-15 minutes in lower back and lower abdomen that feel like "bad period cramps". Reports good fetal movement. Denies leaking of fluid or vaginal bleeding. Denies any other problems with this pregnancy. 3 previous vaginal deliveries without complications per patient. EFM applied and assessing.

## 2018-09-16 NOTE — Discharge Summary (Addendum)
Obstetric Discharge Summary Patient's last menstrual period was 12/15/2017.Estimated Date of Delivery: 09/21/18 Presents to L&D triage with complaints of contractions every 10-15 minutes. Denies leaking, bleeding, or recent intercourse. Endorses good fetal movement.  [id number]  Reason for Admission: observation/evaluation Prenatal Procedures: none Intrapartum Procedures: N/A  Hemoglobin  Date Value Ref Range Status  07/09/2018 10.2 (L) 12.0 - 16.0 g/dL Final  08/65/7846 9.7 (L) 11.1 - 15.9 g/dL Final  96/29/5284 13.2 g/dL Final   HCT  Date Value Ref Range Status  07/09/2018 29.6 (L) 35.0 - 47.0 % Final  05/16/2016 29 % Final   Hematocrit  Date Value Ref Range Status  05/04/2017 29.4 (L) 34.0 - 46.6 % Final    Physical Exam: Gen:A,A&Ox3 HEENT: Normocephalic, Eyes non-icteric. HEART:S1S2, RRR, No M/R/G LUNGS:CTA bilat, no W/R/R ABD: Gravid  Extrems:warm, dry, NT, Neg Homan's  Fetal monitoring: Baseline: 125 bpm Variability: moderate Accelerations: +2 15x15  Decels: none NST: Reactive, Category I  Contractions: Irregular, q10-15 min, mild to palpation    Discharge Diagnoses: IUP at 39 2/7 week, Braxtin Hicks Contractions   Discharge Information:F/U as scheduled with [facility]  Date: 09/16/2018 Activity: unrestricted Diet: routine Medications: PNV Condition: stable Instructions: Notify provider for contractions every 5-7 min for 1-2 hours, if your water breaks, vaginal bleeding, or decreased fetal movement. Follow up in office as scheduled. Discharge to: home  Sharee Pimple 09/16/2018, 1:04 AM

## 2018-09-23 ENCOUNTER — Inpatient Hospital Stay: Payer: Medicaid Other | Admitting: Anesthesiology

## 2018-09-23 ENCOUNTER — Inpatient Hospital Stay
Admission: EM | Admit: 2018-09-23 | Discharge: 2018-09-25 | DRG: 807 | Disposition: A | Discharge status: Home / Self Care | Payer: Medicaid Other | Attending: Obstetrics and Gynecology | Admitting: Obstetrics and Gynecology

## 2018-09-23 ENCOUNTER — Other Ambulatory Visit: Payer: Self-pay

## 2018-09-23 DIAGNOSIS — O9902 Anemia complicating childbirth: Secondary | ICD-10-CM | POA: Diagnosis present

## 2018-09-23 DIAGNOSIS — Z3A40 40 weeks gestation of pregnancy: Secondary | ICD-10-CM | POA: Diagnosis not present

## 2018-09-23 DIAGNOSIS — Z87891 Personal history of nicotine dependence: Secondary | ICD-10-CM | POA: Diagnosis not present

## 2018-09-23 DIAGNOSIS — F1290 Cannabis use, unspecified, uncomplicated: Secondary | ICD-10-CM | POA: Diagnosis present

## 2018-09-23 DIAGNOSIS — D649 Anemia, unspecified: Secondary | ICD-10-CM | POA: Diagnosis present

## 2018-09-23 DIAGNOSIS — O368390 Maternal care for abnormalities of the fetal heart rate or rhythm, unspecified trimester, not applicable or unspecified: Secondary | ICD-10-CM | POA: Diagnosis present

## 2018-09-23 DIAGNOSIS — Z3483 Encounter for supervision of other normal pregnancy, third trimester: Secondary | ICD-10-CM | POA: Diagnosis present

## 2018-09-23 DIAGNOSIS — O99324 Drug use complicating childbirth: Secondary | ICD-10-CM | POA: Diagnosis present

## 2018-09-23 LAB — TYPE AND SCREEN
ABO/RH(D): B POS
ANTIBODY SCREEN: NEGATIVE

## 2018-09-23 LAB — CBC
HEMATOCRIT: 31 % — AB (ref 35.0–47.0)
HEMOGLOBIN: 10.8 g/dL — AB (ref 12.0–16.0)
MCH: 27.1 pg (ref 26.0–34.0)
MCHC: 35 g/dL (ref 32.0–36.0)
MCV: 77.7 fL — ABNORMAL LOW (ref 80.0–100.0)
Platelets: 232 10*3/uL (ref 150–440)
RBC: 3.99 MIL/uL (ref 3.80–5.20)
RDW: 14.5 % (ref 11.5–14.5)
WBC: 9.7 10*3/uL (ref 3.6–11.0)

## 2018-09-23 MED ORDER — ACETAMINOPHEN 325 MG PO TABS: 650.0000 mg | ORAL_TABLET | ORAL | Status: DC | PRN

## 2018-09-23 MED ORDER — ONDANSETRON HCL 4 MG/2ML IJ SOLN: 4.0000 mg | Freq: Four times a day (QID) | INTRAMUSCULAR | Status: DC | PRN

## 2018-09-23 MED ORDER — MISOPROSTOL 200 MCG PO TABS
ORAL_TABLET | ORAL | Status: AC
  Filled 2018-09-23: qty 4

## 2018-09-23 MED ORDER — PHENYLEPHRINE 40 MCG/ML (10ML) SYRINGE FOR IV PUSH (FOR BLOOD PRESSURE SUPPORT)
80.0000 ug | PREFILLED_SYRINGE | INTRAVENOUS | Status: DC | PRN
  Filled 2018-09-23: qty 5

## 2018-09-23 MED ORDER — EPHEDRINE 5 MG/ML INJ
10.0000 mg | INTRAVENOUS | Status: DC | PRN
  Filled 2018-09-23: qty 2

## 2018-09-23 MED ORDER — LIDOCAINE HCL (PF) 1 % IJ SOLN
INTRAMUSCULAR | Status: AC
  Filled 2018-09-23: qty 30

## 2018-09-23 MED ORDER — AMMONIA AROMATIC IN INHA
RESPIRATORY_TRACT | Status: AC
  Filled 2018-09-23: qty 10

## 2018-09-23 MED ORDER — FENTANYL 2.5 MCG/ML W/ROPIVACAINE 0.15% IN NS 100 ML EPIDURAL (ARMC): 12.0000 mL/h | EPIDURAL | Status: DC

## 2018-09-23 MED ORDER — OXYTOCIN 40 UNITS IN LACTATED RINGERS INFUSION - SIMPLE MED
1.0000 m[IU]/min | INTRAVENOUS | Status: DC
  Administered 2018-09-23: 2 m[IU]/min via INTRAVENOUS
  Filled 2018-09-23: qty 1000

## 2018-09-23 MED ORDER — LACTATED RINGERS IV SOLN
INTRAVENOUS | Status: DC
  Administered 2018-09-23: 20:00:00 via INTRAVENOUS

## 2018-09-23 MED ORDER — LIDOCAINE-EPINEPHRINE (PF) 1.5 %-1:200000 IJ SOLN
INTRAMUSCULAR | Status: DC | PRN
  Administered 2018-09-23: 3 mL via EPIDURAL

## 2018-09-23 MED ORDER — LACTATED RINGERS IV SOLN: 500.0000 mL | Freq: Once | INTRAVENOUS | Status: DC

## 2018-09-23 MED ORDER — TERBUTALINE SULFATE 1 MG/ML IJ SOLN: 0.2500 mg | Freq: Once | INTRAMUSCULAR | Status: DC | PRN

## 2018-09-23 MED ORDER — FENTANYL 2.5 MCG/ML W/ROPIVACAINE 0.15% IN NS 100 ML EPIDURAL (ARMC)
EPIDURAL | Status: DC | PRN
  Administered 2018-09-23: 12 mL/h via EPIDURAL

## 2018-09-23 MED ORDER — BUTORPHANOL TARTRATE 1 MG/ML IJ SOLN
1.0000 mg | INTRAMUSCULAR | Status: DC | PRN
  Administered 2018-09-23: 1 mg via INTRAVENOUS
  Filled 2018-09-23: qty 1

## 2018-09-23 MED ORDER — LACTATED RINGERS IV SOLN
500.0000 mL | INTRAVENOUS | Status: DC | PRN
  Administered 2018-09-23: 500 mL via INTRAVENOUS

## 2018-09-23 MED ORDER — OXYTOCIN 10 UNIT/ML IJ SOLN
INTRAMUSCULAR | Status: AC
  Filled 2018-09-23: qty 2

## 2018-09-23 MED ORDER — OXYTOCIN 40 UNITS IN LACTATED RINGERS INFUSION - SIMPLE MED
2.5000 [IU]/h | INTRAVENOUS | Status: DC
  Administered 2018-09-23: 2.5 [IU]/h via INTRAVENOUS
  Filled 2018-09-23: qty 1000

## 2018-09-23 MED ORDER — FENTANYL 2.5 MCG/ML W/ROPIVACAINE 0.15% IN NS 100 ML EPIDURAL (ARMC)
EPIDURAL | Status: AC
  Filled 2018-09-23: qty 100

## 2018-09-23 MED ORDER — SOD CITRATE-CITRIC ACID 500-334 MG/5ML PO SOLN: 30.0000 mL | ORAL | Status: DC | PRN

## 2018-09-23 MED ORDER — LIDOCAINE HCL (PF) 1 % IJ SOLN
30.0000 mL | INTRAMUSCULAR | Status: AC | PRN
  Administered 2018-09-23: 1.2 mL via SUBCUTANEOUS

## 2018-09-23 MED ORDER — OXYTOCIN BOLUS FROM INFUSION
500.0000 mL | Freq: Once | INTRAVENOUS | Status: AC
  Administered 2018-09-23: 500 mL via INTRAVENOUS

## 2018-09-23 MED ORDER — DIPHENHYDRAMINE HCL 50 MG/ML IJ SOLN: 12.5000 mg | INTRAMUSCULAR | Status: DC | PRN

## 2018-09-23 NOTE — Discharge Summary (Signed)
Obstetrical Discharge Summary  Patient Name: Nancy Ball DOB: Feb 10, 1996 MRN: [id number]  Date of Admission: 09/23/2018 Date of Discharge: 09/25/2018  Primary OB: [facility] high risk OB for hx of Postpartum PreE Gestational Age at Delivery: [redacted]w[redacted]d   Antepartum complications:  Prenatal course complicated by  - short interpregnancy interval, last delivery 1mo prior to conception - hx of postpartum PreE: on methyldopa last pregnancy - hx of postpartum depresssion, on zoloft fo 12 months - juvenile arthritis dx in 2014 at [facility], no f/u since dx - exercise induced asthma, no meds or hospitalizations - THC use in pregnancy  Admitting Diagnosis: Active labor Secondary Diagnosis: Cat II strip Patient Active Problem List   Diagnosis Date Noted  . Non-reassuring electronic fetal monitoring tracing 09/23/2018  . Pregnancy 07/09/2018  . History of depression 08/06/2017  . Postpartum care following vaginal delivery 07/31/2017  . Anemia complicating pregnancy, third trimester 06/03/2017  . History of pre-eclampsia 02/23/2017  . Asthma 02/23/2017    Augmentation: AROM and Pitocin Complications: None Intrapartum complications/course:  22yo [id number] at 40+2wks admitted with contractions and found to have late decels on admission and 6cm cervix. She was augmented with AROM for clear fluid and pitocin given. She received an epidural and entered the second stage of labor. She pushed over an intact perineum and FHT dropped to the 60s. They did not resolved and vacuum applied. Three pulls with no popoffs, and the FHT rose to the 90s. Vacuum released, and she pushed again for delivery. No tears, minimal bleeding. Postpartum pitocin was given.  Baby placed on maternal abdomen and vigorous and crying. Cord clamped and cut, placenta delivered spontaneously intact. Mother and baby tolerated the procedure well.  Date of Delivery: 09/23/18 Delivered By: Christeen Douglas Delivery Type: vacuum, low Anesthesia:  epidural Placenta: Spontaneous Laceration: none Episiotomy: none Newborn Data: Live born female "Jackelyn Hoehn" Birth Weight: 7 lb 8.6 oz (3420 g) APGAR: 8, 9   Newborn Delivery   Time head delivered:  09/23/2018 21:43:00 Birth date/time:  09/23/2018 21:44:00 Delivery type:  Vaginal, Vacuum (Extractor)     Discharge Physical Exam:  BP 120/78 (BP Location: Left Arm)   Pulse 76   Temp 98.4 F (36.9 C) (Oral)   Resp 18   Ht 4\' 11"  (1.499 m)   Wt 68.5 kg   LMP 12/15/2017   SpO2 98%   Breastfeeding? Unknown   BMI 30.50 kg/m   General: NAD CV: RRR Pulm: CTABL, nl effort ABD: s/nd/nt, fundus firm and below the umbilicus Lochia: moderate DVT Evaluation: LE non-ttp, no evidence of DVT on exam.  Hemoglobin  Date Value Ref Range Status  09/24/2018 10.3 (L) 12.0 - 16.0 g/dL Final  14/78/2956 9.7 (L) 11.1 - 15.9 g/dL Final  21/30/8657 84.6 g/dL Final   HCT  Date Value Ref Range Status  09/24/2018 29.3 (L) 35.0 - 47.0 % Final  05/16/2016 29 % Final   Hematocrit  Date Value Ref Range Status  05/04/2017 29.4 (L) 34.0 - 46.6 % Final    Post partum course: Patient had an uncomplicated postpartum course.  By time of discharge on PPD#2, her pain was controlled on oral pain medications; she had appropriate lochia and was ambulating, voiding without difficulty and tolerating regular diet.  She was deemed stable for discharge to home.    Postpartum Procedures: none Disposition: stable, discharge to home. Baby Feeding: formula Baby Disposition: home with mom  Rh Immune globulin given: n/a Rubella vaccine given: n/a Tdap vaccine given in AP or PP setting:  given AP per [facility] records Flu vaccine given in AP or PP setting: 03/08/18  Contraception: Paragard IUD  Prenatal Labs: Blood type/Rh --/--/B POS (10/03 2101)  Antibody screen neg  Rubella Immune  Varicella Immune  RPR NR  HBsAg Neg  HIV NR  GC neg  Chlamydia neg  Genetic screening negative  1 hour GTT 93  3 hour GTT n/a   GBS negative    Plan:  Nancy Ball was discharged to home in good condition. Follow-up appointment at [facility]  with delivering provider in 6 weeks  Discharge Medications: Allergies as of 09/25/2018      Reactions   Shellfish Allergy Anaphylaxis   Excedrin Migraine [aspirin-acetaminophen-caffeine] Rash      Medication List    TAKE these medications   acetaminophen 325 MG tablet Commonly known as:  TYLENOL Take 2 tablets (650 mg total) by mouth every 4 (four) hours as needed for mild pain or moderate pain.   ibuprofen 600 MG tablet Commonly known as:  ADVIL,MOTRIN Take 1 tablet (600 mg total) by mouth every 6 (six) hours as needed for mild pain, moderate pain or cramping.   multivitamin-prenatal 27-0.8 MG Tabs tablet Take 1 tablet by mouth daily at 12 noon.       Follow-up Information    Christeen Douglas, MD. Schedule an appointment as soon as possible for a visit in 6 week(s).   Specialty:  Obstetrics and Gynecology Why:  For routine postpartum visit.  Contact information: [address] [place] [address] [phone]           Signed: Genia Del, CNM 09/25/2018 10:30 AM

## 2018-09-23 NOTE — Anesthesia Preprocedure Evaluation (Signed)
Anesthesia Evaluation  Patient identified by MRN, date of birth, ID band Patient awake    Reviewed: Allergy & Precautions, NPO status , Patient's Chart, lab work & pertinent test results  History of Anesthesia Complications Negative for: history of anesthetic complications  Airway Mallampati: II       Dental   Pulmonary asthma , neg sleep apnea, neg COPD, former smoker,           Cardiovascular hypertension (pre-eclampsia, post-partum last pregnancy), (-) Past MI      Neuro/Psych neg Seizures Depression    GI/Hepatic Neg liver ROS, GERD (with pregnancy)  ,  Endo/Other  neg diabetes  Renal/GU negative Renal ROS     Musculoskeletal   Abdominal   Peds  Hematology  (+) anemia ,   Anesthesia Other Findings   Reproductive/Obstetrics                             Anesthesia Physical Anesthesia Plan  ASA: II  Anesthesia Plan: Epidural   Post-op Pain Management:    Induction:   PONV Risk Score and Plan:   Airway Management Planned:   Additional Equipment:   Intra-op Plan:   Post-operative Plan:   Informed Consent: I have reviewed the patients History and Physical, chart, labs and discussed the procedure including the risks, benefits and alternatives for the proposed anesthesia with the patient or authorized representative who has indicated his/her understanding and acceptance.     Plan Discussed with:   Anesthesia Plan Comments:         Anesthesia Quick Evaluation

## 2018-09-23 NOTE — Anesthesia Procedure Notes (Signed)
Epidural Patient location during procedure: OB Start time: 09/23/2018 8:47 PM End time: 09/23/2018 9:11 PM  Staffing Performed: anesthesiologist   Preanesthetic Checklist Completed: patient identified, site marked, surgical consent, pre-op evaluation, timeout performed, IV checked, risks and benefits discussed and monitors and equipment checked  Epidural Patient position: sitting Prep: ChloraPrep Patient monitoring: heart rate, continuous pulse ox and blood pressure Approach: midline Location: L4-L5 Injection technique: LOR saline  Needle:  Needle type: Tuohy  Needle gauge: 18 G Needle length: 9 cm and 9 Catheter type: closed end flexible Catheter size: 20 Guage Test dose: negative and 1.5% lidocaine with Epi 1:200 K  Assessment Events: blood not aspirated, injection not painful, no injection resistance, negative IV test and no paresthesia  Additional Notes   Patient tolerated the insertion well without complications.Reason for block:procedure for pain

## 2018-09-23 NOTE — H&P (Addendum)
OB ADMISSION/ HISTORY & PHYSICAL:  Admission Date: 09/23/2018  3:48 PM  Admit Diagnosis: Contractions at term, Cat II strip  Nancy Ball is a 22 y.o. female presenting for contractions. In triage, she was noted to have late decels  Prenatal History: [id number]   EDC : 09/21/2018, Date entered prior to episode creation  Prenatal care at [facility] for hx of postpartum PreE Prenatal course complicated by  - short interpregnancy interval, last delivery 85mo prior to conception - hx of postpartum PreE: on methyldopa last pregnancy - hx of postpartum depresssion, on zoloft fo 12 months - juvenile arthritis dx in 2014 at [facility], no f/u since dx - exercise induced asthma, no meds or hospitalizations - THC use in pregnancy   Medical / Surgical History :  Past medical history:  Past Medical History:  Diagnosis Date  . Anemia during pregnancy   . 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 Asthma    well controlled, last episode in high school with running track  . Depression    PP depression, on zoloft presently  . Hypertension   . Postpartum depression      Past surgical history:  Past Surgical History:  Procedure Laterality Date  . WISDOM TOOTH EXTRACTION Bilateral 2013   states she was put to sleep    Family History:  Family History  Problem Relation Age of Onset  . Cancer Paternal Aunt      Social History:  reports that she quit smoking about 21 months ago. Her smoking use included cigarettes. She has a 1.00 pack-year smoking history. She has never used smokeless tobacco. She reports that she does not drink alcohol or use drugs.   Allergies: Shellfish allergy and Excedrin migraine [aspirin-acetaminophen-caffeine]    Current Medications at time of admission:  Prior to Admission medications   Medication Sig Start Date End Date Taking? Authorizing Provider  Prenatal Vit-Fe Fumarate-FA (MULTIVITAMIN-PRENATAL) 27-0.8 MG TABS tablet  Take 1 tablet by mouth daily at 12 noon.    [provider]     Review of Systems: Active FM onset of ctx @  currently every 5 minutes LOF  / SROM: no bloody show no   Physical Exam:  VS: Blood pressure 118/72, pulse (!) 102, temperature 98.2 F (36.8 C), temperature source Oral, resp. rate 16, last menstrual period 12/15/2017, not currently breastfeeding.  General: alert and oriented, appears NAD, comfortable Heart: RRR Lungs: Clear lung fields Abdomen: Gravid, soft and non-tender, non-distended / uterus: NAD Extremities: no edema  FHT: 125, moderate variability, +accels, +late decels TOCO: q4-7 min SVE:  Dilation: 6 / Effacement (%): 80 / Station: -3    Cephalic by leopolds  Prenatal Labs: Blood type/Rh  B positive  Antibody screen neg  Rubella Immune  Varicella Immune  RPR NR  HBsAg Neg  HIV NR  GC neg  Chlamydia neg  Genetic screening negative  1 hour GTT 93  3 hour GTT n/a  GBS negative   No results found.  Assessment: 40+[redacted] weeks gestation 1 stage of labor FHR category Cat II with accels and lates   Plan:   Admit for active labor. Will augment with pitocin and AROM Labs pending Epidural when desired Continuous fetal monitoring   1. Fetal Well being  - Fetal Tracing: Cat II - Ultrasound: anatomy scan wnl - Group B Streptococcus: neg on 08/18/18 - Presentation: vtx confirmed by Leopolds   2. Routine OB: - Prenatal labs reviewed,  as above - Rh positive

## 2018-09-24 LAB — URINE DRUG SCREEN, QUALITATIVE (ARMC ONLY)
Amphetamines, Ur Screen: NOT DETECTED
BARBITURATES, UR SCREEN: NOT DETECTED
Benzodiazepine, Ur Scrn: NOT DETECTED
COCAINE METABOLITE, UR ~~LOC~~: NOT DETECTED
Cannabinoid 50 Ng, Ur ~~LOC~~: NOT DETECTED
MDMA (Ecstasy)Ur Screen: NOT DETECTED
METHADONE SCREEN, URINE: NOT DETECTED
Opiate, Ur Screen: NOT DETECTED
Phencyclidine (PCP) Ur S: NOT DETECTED
TRICYCLIC, UR SCREEN: NOT DETECTED

## 2018-09-24 LAB — CBC
HCT: 29.3 % — ABNORMAL LOW (ref 35.0–47.0)
HEMOGLOBIN: 10.3 g/dL — AB (ref 12.0–16.0)
MCH: 27.6 pg (ref 26.0–34.0)
MCHC: 35.2 g/dL (ref 32.0–36.0)
MCV: 78.4 fL — ABNORMAL LOW (ref 80.0–100.0)
Platelets: 196 10*3/uL (ref 150–440)
RBC: 3.73 MIL/uL — AB (ref 3.80–5.20)
RDW: 14.8 % — ABNORMAL HIGH (ref 11.5–14.5)
WBC: 11.9 10*3/uL — AB (ref 3.6–11.0)

## 2018-09-24 MED ORDER — COCONUT OIL OIL: 1.0000 "application " | TOPICAL_OIL | Status: DC | PRN

## 2018-09-24 MED ORDER — ZOLPIDEM TARTRATE 5 MG PO TABS: 5.0000 mg | ORAL_TABLET | Freq: Every evening | ORAL | Status: DC | PRN

## 2018-09-24 MED ORDER — SODIUM CHLORIDE 0.9% FLUSH: 3.0000 mL | Freq: Two times a day (BID) | INTRAVENOUS | Status: DC

## 2018-09-24 MED ORDER — ONDANSETRON HCL 4 MG/2ML IJ SOLN: 4.0000 mg | INTRAMUSCULAR | Status: DC | PRN

## 2018-09-24 MED ORDER — SENNOSIDES-DOCUSATE SODIUM 8.6-50 MG PO TABS: 2.0000 | ORAL_TABLET | ORAL | Status: DC

## 2018-09-24 MED ORDER — FLEET ENEMA 7-19 GM/118ML RE ENEM: 1.0000 | ENEMA | Freq: Every day | RECTAL | Status: DC | PRN

## 2018-09-24 MED ORDER — WITCH HAZEL-GLYCERIN EX PADS: 1.0000 "application " | MEDICATED_PAD | CUTANEOUS | Status: DC | PRN

## 2018-09-24 MED ORDER — SODIUM CHLORIDE 0.9 % IV SOLN: 250.0000 mL | INTRAVENOUS | Status: DC | PRN

## 2018-09-24 MED ORDER — TETANUS-DIPHTH-ACELL PERTUSSIS 5-2.5-18.5 LF-MCG/0.5 IM SUSP: 0.5000 mL | Freq: Once | INTRAMUSCULAR | Status: DC

## 2018-09-24 MED ORDER — IBUPROFEN 600 MG PO TABS
600.0000 mg | ORAL_TABLET | Freq: Four times a day (QID) | ORAL | Status: DC
  Administered 2018-09-24 – 2018-09-25 (×4): 600 mg via ORAL
  Filled 2018-09-24 (×4): qty 1

## 2018-09-24 MED ORDER — PRENATAL MULTIVITAMIN CH
1.0000 | ORAL_TABLET | Freq: Every day | ORAL | Status: DC
  Administered 2018-09-24 – 2018-09-25 (×2): 1 via ORAL
  Filled 2018-09-24 (×2): qty 1

## 2018-09-24 MED ORDER — DIBUCAINE 1 % RE OINT: 1.0000 "application " | TOPICAL_OINTMENT | RECTAL | Status: DC | PRN

## 2018-09-24 MED ORDER — MEASLES, MUMPS & RUBELLA VAC ~~LOC~~ INJ: 0.5000 mL | INJECTION | Freq: Once | SUBCUTANEOUS | Status: DC

## 2018-09-24 MED ORDER — DIPHENHYDRAMINE HCL 25 MG PO CAPS: 25.0000 mg | ORAL_CAPSULE | Freq: Four times a day (QID) | ORAL | Status: DC | PRN

## 2018-09-24 MED ORDER — ONDANSETRON HCL 4 MG PO TABS: 4.0000 mg | ORAL_TABLET | ORAL | Status: DC | PRN

## 2018-09-24 MED ORDER — IBUPROFEN 600 MG PO TABS
ORAL_TABLET | ORAL | Status: AC
  Filled 2018-09-24: qty 1

## 2018-09-24 MED ORDER — SIMETHICONE 80 MG PO CHEW: 80.0000 mg | CHEWABLE_TABLET | ORAL | Status: DC | PRN

## 2018-09-24 MED ORDER — OXYCODONE HCL 5 MG PO TABS: 5.0000 mg | ORAL_TABLET | ORAL | Status: DC | PRN

## 2018-09-24 MED ORDER — BISACODYL 10 MG RE SUPP: 10.0000 mg | Freq: Every day | RECTAL | Status: DC | PRN

## 2018-09-24 MED ORDER — BENZOCAINE-MENTHOL 20-0.5 % EX AERO: 1.0000 "application " | INHALATION_SPRAY | CUTANEOUS | Status: DC | PRN

## 2018-09-24 MED ORDER — SODIUM CHLORIDE 0.9% FLUSH: 3.0000 mL | INTRAVENOUS | Status: DC | PRN

## 2018-09-24 MED ORDER — IBUPROFEN 600 MG PO TABS
600.0000 mg | ORAL_TABLET | Freq: Four times a day (QID) | ORAL | Status: DC
  Administered 2018-09-24 (×2): 600 mg via ORAL
  Filled 2018-09-24: qty 1

## 2018-09-24 MED ORDER — ACETAMINOPHEN 325 MG PO TABS
650.0000 mg | ORAL_TABLET | ORAL | Status: DC | PRN
  Administered 2018-09-24 (×4): 650 mg via ORAL
  Filled 2018-09-24 (×4): qty 2

## 2018-09-24 NOTE — Clinical Social Work Maternal (Signed)
  CLINICAL SOCIAL WORK MATERNAL/CHILD NOTE  Patient Details  Name: Nancy Ball MRN: [id number] Date of Birth: [birth year]  Date:  09/24/2018  Clinical Social Worker Initiating Note:  York Spaniel MSW,LCSW Date/Time: Initiated:  09/24/18/      Child's Name:      Biological Parents:  Mother, Father   Need for Interpreter:  None   Reason for Referral:  Other (Comment)(high risk for postpartum depression)   Address:  [address] [place] [address]    Phone number:  [phone] (home)     Additional phone number: none  Household Members/Support Persons (HM/SP):       HM/SP Name Relationship DOB or Age  HM/SP -1        HM/SP -2        HM/SP -3        HM/SP -4        HM/SP -5        HM/SP -6        HM/SP -7        HM/SP -8          Natural Supports (not living in the home):  Extended Family, Immediate Family   Professional Supports: Therapist   Employment:     Type of Work:     Education:      Homebound arranged:    Surveyor, quantity Resources:  Self-Pay    Other Resources:  [facility]   Cultural/Religious Considerations Which May Impact Care:  none  Strengths:  Ability to meet basic needs , Compliance with medical plan , Home prepared for child    Psychotropic Medications:         Pediatrician:       Pediatrician List:   [facility] [place]    [place]      Pediatrician Fax Number:    Risk Factors/Current Problems:  Mental Health Concerns    Cognitive State:  Alert , Able to Concentrate    Mood/Affect:  Happy , Calm    CSW Assessment: CSW asked to see patient due to history of postpartum depression. CSW spoke with patient and her boyfriend at bedside. Patient explains that she is not yet legally divorced from current husband and father of her other 3 children. She states that her 3 children live with their father and he has primary custody of them. She states when they  separated, she was unable to financially support their 3 children. Patient states that her situation is different with her newborn and boyfriend. They are living with her boyfriend's mother and she has been reportedly supportive during their relationship. They report having all supplies for their newborn. They have transportation. Father of current baby is employed. In regards to patient's history of postpartum depression, patient informed CSW that she had it with all three of her children. She states she was taking zoloft prior to her pregnancy and will restart now that she has delivered. She is also seeing a therapist [facility] and will be following for frequent mood checks, etc. Patient and boyfriend were pleasant and cooperative with assessment. No further concerns or issues at this time.     CSW Plan/Description:  No Further Intervention Required/No Barriers to Discharge    York Spaniel, LCSW 09/24/2018, 12:29 PM

## 2018-09-24 NOTE — Anesthesia Postprocedure Evaluation (Signed)
Anesthesia Post Note  Patient: [employer]  Procedure(s) Performed: AN AD HOC LABOR EPIDURAL  Patient location during evaluation: Mother Baby Anesthesia Type: Epidural Level of consciousness: awake and alert Pain management: pain level controlled Vital Signs Assessment: post-procedure vital signs reviewed and stable Respiratory status: spontaneous breathing, nonlabored ventilation and respiratory function stable Cardiovascular status: stable Postop Assessment: no headache, no backache and epidural receding Anesthetic complications: no     Last Vitals:  Vitals:   09/24/18 0617 09/24/18 0730  BP: 115/73 (!) 135/94  Pulse: 67 (!) 57  Resp: 18 18  Temp: 36.7 C 36.6 C  SpO2: 97% 99%    Last Pain:  Vitals:   09/24/18 0946  TempSrc:   PainSc: 8                  Hagop Mccollam,  Alessandra Bevels

## 2018-09-25 LAB — RUBELLA SCREEN: Rubella: 7.23 index (ref >0.99)

## 2018-09-25 LAB — RPR: RPR Ser Ql: NONREACTIVE

## 2018-09-25 MED ORDER — ACETAMINOPHEN 325 MG PO TABS: 650.0000 mg | ORAL_TABLET | ORAL | 0 refills | Status: DC | PRN

## 2018-09-25 MED ORDER — IBUPROFEN 600 MG PO TABS: 600.0000 mg | ORAL_TABLET | Freq: Four times a day (QID) | ORAL | 0 refills | Status: DC | PRN

## 2018-09-25 NOTE — Progress Notes (Signed)
Discharge instructions provided.  Pt and sig other verbalize understanding of all instructions and follow-up care. Pt discharged to home with infant at 1248 on 09/25/18 via wheelchair by NT. Reynold Bowen, RN 09/25/2018 12:57 PM

## 2018-09-25 NOTE — Progress Notes (Signed)
Post Partum Day 1 Subjective: Doing well, no complaints.  Tolerating regular diet, pain with PO meds, voiding and ambulating without difficulty.  No CP SOB F/C N/V or leg pain no HA change of vision, RUQ/epigastric pain  Objective: BP 118/88 (BP Location: Right Arm)   Pulse 67   Temp 98.2 F (36.8 C) (Oral)   Resp 18   Ht 4\' 11"  (1.499 m)   Wt 68.5 kg   LMP 12/15/2017   SpO2 99%   Breastfeeding? Unknown   BMI 30.50 kg/m    Physical Exam:  General: NAD CV: RRR Pulm: nl effort, CTABL Lochia: moderate Uterine Fundus: fundus firm and below umbilicus DVT Evaluation: no cords, ttp LEs   Recent Labs    09/23/18 1950 09/24/18 0701  HGB 10.8* 10.3*  HCT 31.0* 29.3*  WBC 9.7 11.9*  PLT 232 196    Assessment/Plan: 22 y.o. [id number] postpartum day # 1  1.  Doing well, continue routine postpartum cares 2. Anticipated discharge tomorrow AM    ----- Ranae Plumber, MD Attending Obstetrician and Gynecologist [facility]

## 2018-09-25 NOTE — Discharge Instructions (Signed)

## 2018-09-25 NOTE — Progress Notes (Signed)
Patient states that she received Influenza and TDaP vaccines within past "few weeks".  Patient declines vaccines at this time. Reynold Bowen, RN 09/25/2018 10:21 AM

## 2018-12-11 ENCOUNTER — Other Ambulatory Visit: Payer: Self-pay

## 2018-12-11 ENCOUNTER — Emergency Department
Admission: EM | Admit: 2018-12-11 | Discharge: 2018-12-11 | Disposition: A | Discharge status: Home / Self Care | Payer: Medicaid Other | Attending: Emergency Medicine | Admitting: Emergency Medicine

## 2018-12-11 ENCOUNTER — Emergency Department: Payer: Medicaid Other

## 2018-12-11 DIAGNOSIS — F17210 Nicotine dependence, cigarettes, uncomplicated: Secondary | ICD-10-CM | POA: Insufficient documentation

## 2018-12-11 DIAGNOSIS — S6992XA Unspecified injury of left wrist, hand and finger(s), initial encounter: Secondary | ICD-10-CM | POA: Diagnosis present

## 2018-12-11 DIAGNOSIS — Y999 Unspecified external cause status: Secondary | ICD-10-CM | POA: Diagnosis not present

## 2018-12-11 DIAGNOSIS — J45909 Unspecified asthma, uncomplicated: Secondary | ICD-10-CM | POA: Insufficient documentation

## 2018-12-11 DIAGNOSIS — S63502A Unspecified sprain of left wrist, initial encounter: Secondary | ICD-10-CM | POA: Insufficient documentation

## 2018-12-11 DIAGNOSIS — Y939 Activity, unspecified: Secondary | ICD-10-CM | POA: Diagnosis not present

## 2018-12-11 DIAGNOSIS — Y929 Unspecified place or not applicable: Secondary | ICD-10-CM | POA: Insufficient documentation

## 2018-12-11 DIAGNOSIS — W19XXXA Unspecified fall, initial encounter: Secondary | ICD-10-CM | POA: Insufficient documentation

## 2018-12-11 NOTE — Discharge Instructions (Signed)
Follow-up with your primary care provider if any continued problems.  Ice and elevation to reduce swelling and help with pain.  Wear wrist splint for support and protection.  You may take Tylenol or ibuprofen as needed for inflammation or pain.

## 2018-12-11 NOTE — ED Provider Notes (Signed)
[facility] Emergency Department Provider Note   ____________________________________________   First MD Initiated Contact with Patient 12/11/18 1408     (approximate)  I have reviewed the triage vital signs and the nursing notes.   HISTORY  Chief Complaint Wrist Pain   HPI Nancy Ball is a 22 y.o. female presents with complaint of left wrist pain.  Patient states that she fell yesterday and is continued to have pain since that time.  She denies any head injury or loss of consciousness during her injury.  She rates her pain as 6 out of 10.  Past Medical History:  Diagnosis Date  . Anemia during pregnancy   . 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 Asthma    well controlled, last episode in high school with running track  . Depression    PP depression, on zoloft presently  . Hypertension   . Postpartum depression     Patient Active Problem List   Diagnosis Date Noted  . Non-reassuring electronic fetal monitoring tracing 09/23/2018  . Pregnancy 07/09/2018  . History of depression 08/06/2017  . Postpartum care following vaginal delivery 07/31/2017  . Anemia complicating pregnancy, third trimester 06/03/2017  . History of pre-eclampsia 02/23/2017  . Asthma 02/23/2017    Past Surgical History:  Procedure Laterality Date  . WISDOM TOOTH EXTRACTION Bilateral 2013   states she was put to sleep    Prior to Admission medications   Medication Sig Start Date End Date Taking? Authorizing Provider  acetaminophen (TYLENOL) 325 MG tablet Take 2 tablets (650 mg total) by mouth every 4 (four) hours as needed for mild pain or moderate pain. 09/25/18   Genia DelHaviland, Margaret, CNM  ibuprofen (ADVIL,MOTRIN) 600 MG tablet Take 1 tablet (600 mg total) by mouth every 6 (six) hours as needed for mild pain, moderate pain or cramping. 09/25/18   Genia DelHaviland, Margaret, CNM  Prenatal Vit-Fe Fumarate-FA  (MULTIVITAMIN-PRENATAL) 27-0.8 MG TABS tablet Take 1 tablet by mouth daily at 12 noon.    [provider]    Allergies Shellfish allergy and Excedrin migraine [aspirin-acetaminophen-caffeine]  Family History  Problem Relation Age of Onset  . Cancer Paternal Aunt     Social History Social History   Tobacco Use  . Smoking status: Current Every Day Smoker    Packs/day: 0.50    Years: 2.00    Pack years: 1.00    Types: Cigarettes    Last attempt to quit: 12/05/2016    Years since quitting: 2.0  . Smokeless tobacco: Never Used  Substance Use Topics  . Alcohol use: Yes  . Drug use: No    Types: Marijuana    Review of Systems Constitutional: No fever/chills ENT: No trauma. Cardiovascular: Denies chest pain. Respiratory: Denies shortness of breath. Musculoskeletal: Positive pain left wrist. Skin: Negative for rash. Neurological: Negative for headaches, focal weakness or numbness. ___________________________________________   PHYSICAL EXAM:  VITAL SIGNS: ED Triage Vitals  Enc Vitals Group     BP 12/11/18 1401 134/82     Pulse Rate 12/11/18 1401 66     Resp 12/11/18 1401 16     Temp 12/11/18 1401 98.2 F (36.8 C)     Temp Source 12/11/18 1401 Oral     SpO2 12/11/18 1401 100 %     Weight 12/11/18 1402 132 lb (59.9 kg)     Height 12/11/18 1402 4\' 11"  (1.499 m)     Head Circumference --  Peak Flow --      Pain Score 12/11/18 1401 6     Pain Loc --      Pain Edu? --      Excl. in [place]? --    Constitutional: Alert and oriented. Well appearing and in no acute distress. Eyes: Conjunctivae are normal.  Head: Atraumatic. Neck: No stridor.   Cardiovascular: Normal rate, regular rhythm. Grossly normal heart sounds.  Good peripheral circulation. Respiratory: Normal respiratory effort.  No retractions. Lungs CTAB. Musculoskeletal: Examination of the left wrist there is no gross deformity and minimal soft tissue edema present.  Range of motion is restricted  secondary to discomfort.  There is no ecchymosis or abrasions noted.  Motor sensory function intact distal to the injury. Neurologic:  Normal speech and language. No gross focal neurologic deficits are appreciated. Skin:  Skin is warm, dry and intact. No rash noted. Psychiatric: Mood and affect are normal. Speech and behavior are normal.  ____________________________________________   LABS (all labs ordered are listed, but only abnormal results are displayed)  Labs Reviewed - No data to display  RADIOLOGY  ED MD interpretation:  Left wrist x-ray is negative for acute fracture or bony abnormalities.  Official radiology report(s): Dg Wrist Complete Left  Result Date: 12/11/2018 CLINICAL DATA:  Fall, wrist pain EXAM: LEFT WRIST - COMPLETE 3+ VIEW COMPARISON:  None. FINDINGS: There is no evidence of fracture or dislocation. There is no evidence of arthropathy or other focal bone abnormality. Soft tissues are unremarkable. IMPRESSION: Negative. Electronically Signed   By: Judie PetitM.  Shick M.D.   On: 12/11/2018 14:42    ____________________________________________   PROCEDURES  Procedure(s) performed: None  Procedures  Critical Care performed: No  ____________________________________________   INITIAL IMPRESSION / ASSESSMENT AND PLAN / ED COURSE  As part of my medical decision making, I reviewed the following data within the electronic MEDICAL RECORD NUMBER Notes from prior ED visits and [place] Controlled Substance Database  Patient presents to the ED with complaint of left wrist pain after falling yesterday.  She denies any head injury or loss of consciousness.  She continues to have pain with range of motion of her left wrist.  Examination of left wrist is low risk for fracture and x-ray confirms that no fracture or dislocation is noted.  Patient was placed in a cock-up wrist splint.  She is encouraged to take Tylenol or ibuprofen as needed for inflammation and pain.  She is to follow-up with  her PCP if any continued problems.  ____________________________________________   FINAL CLINICAL IMPRESSION(S) / ED DIAGNOSES  Final diagnoses:  Sprain of left wrist, initial encounter     ED Discharge Orders    None       Note:  This document was prepared using Dragon voice recognition software and may include unintentional dictation errors.    Tommi RumpsSummers, Rhonda L, PA-C 12/11/18 1457    Minna AntisPaduchowski, Kevin, MD 12/12/18 Windell [id number]

## 2018-12-11 NOTE — ED Triage Notes (Signed)
Pt fell on L wrist last night, c/o pain worse today.

## 2019-01-18 IMAGING — DX DG WRIST COMPLETE 3+V*L*
4 series · 4 of 4 positions shown · non-contrast
Comparison: None.

CLINICAL DATA: Fall, wrist pain

EXAM:
LEFT WRIST - COMPLETE 3+ VIEW

[wrist ap (1 of 2)]
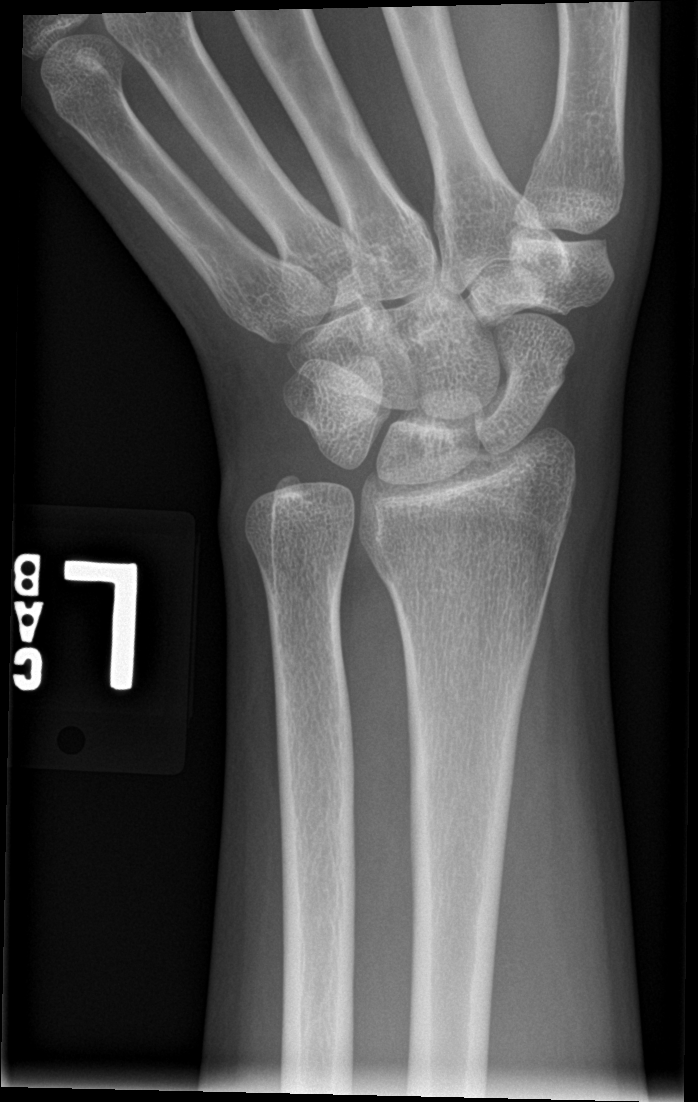

[wrist obl]
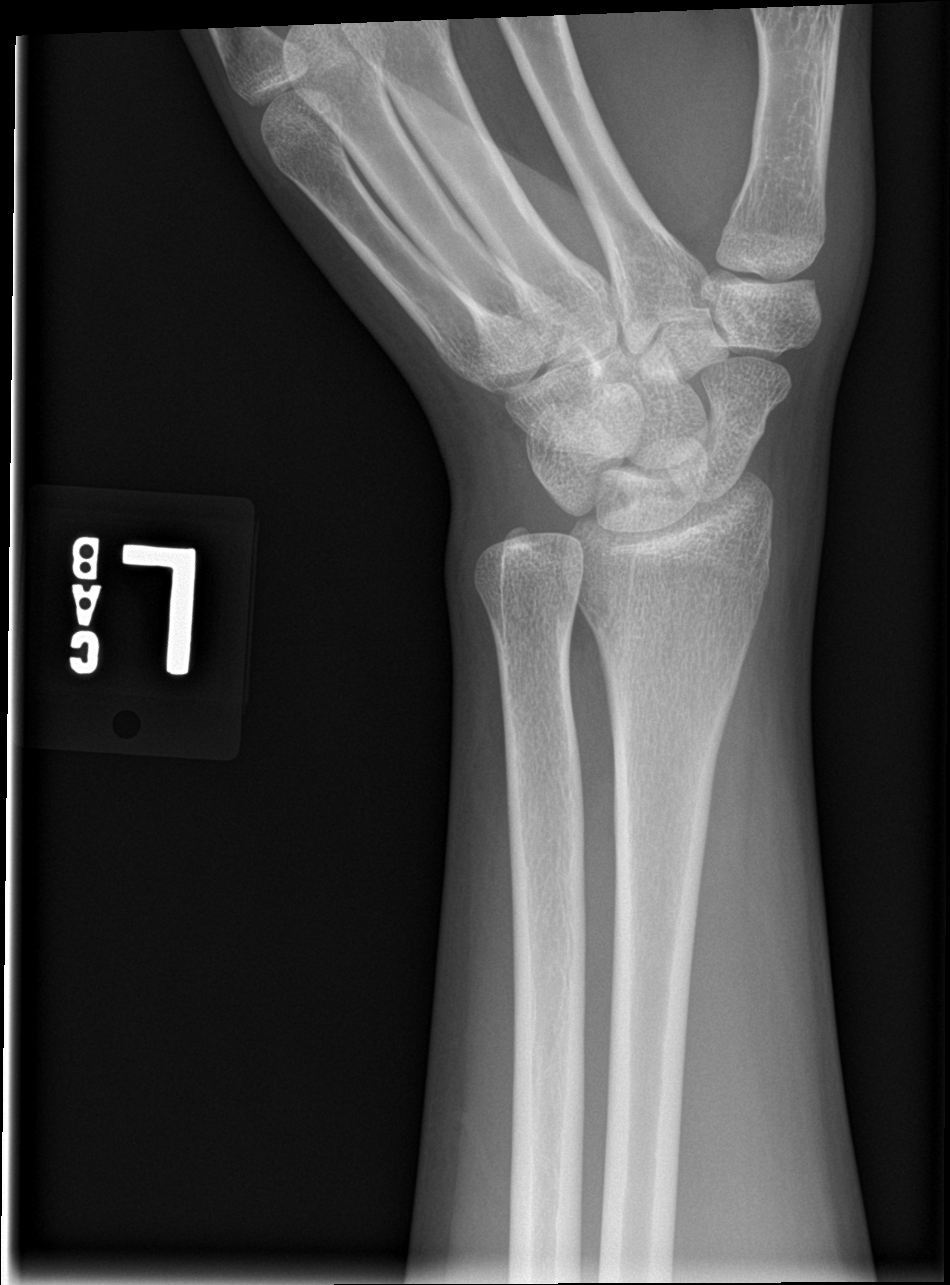

[wrist lat]
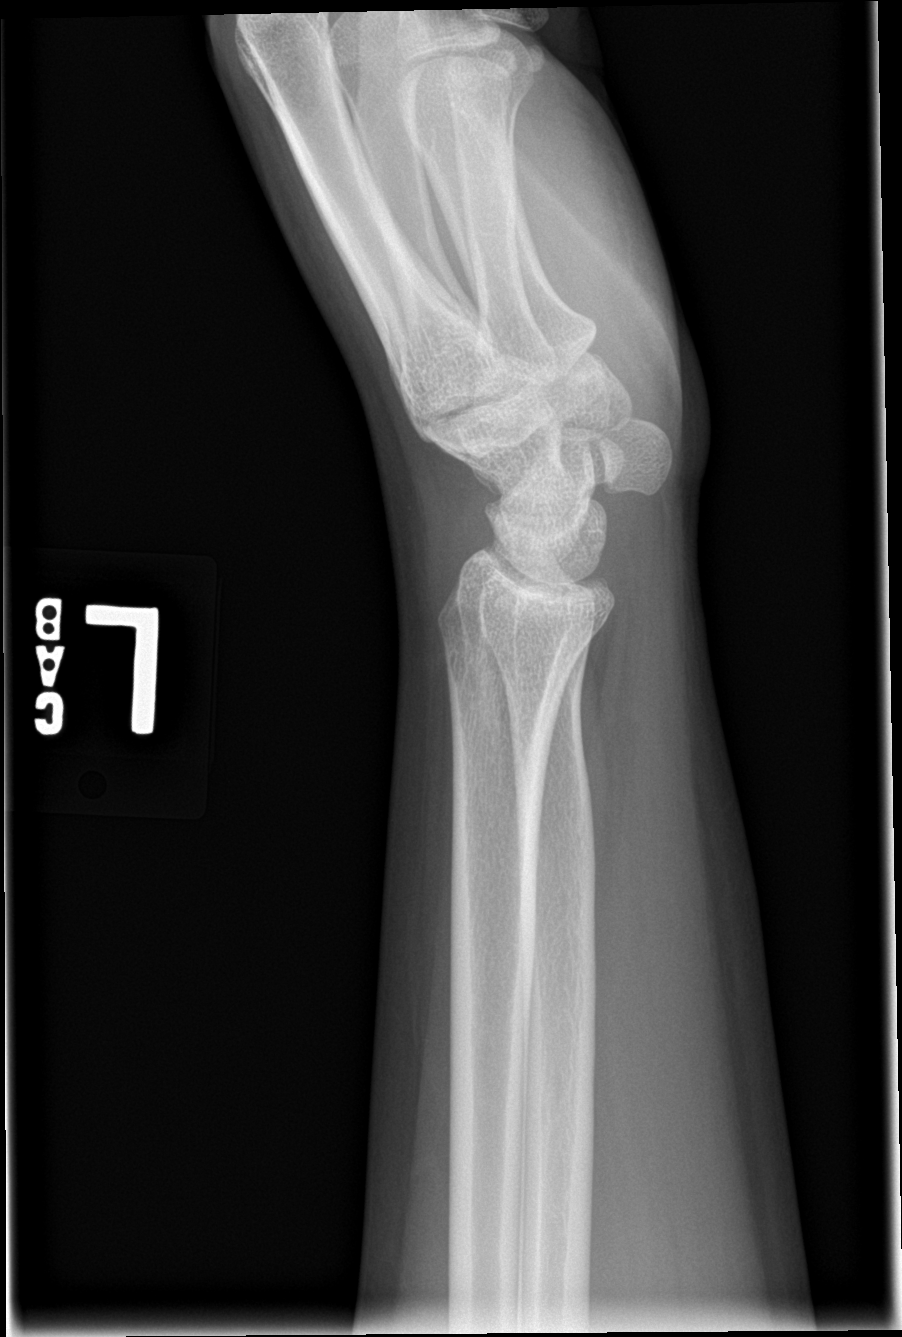

[wrist ap (2 of 2)]
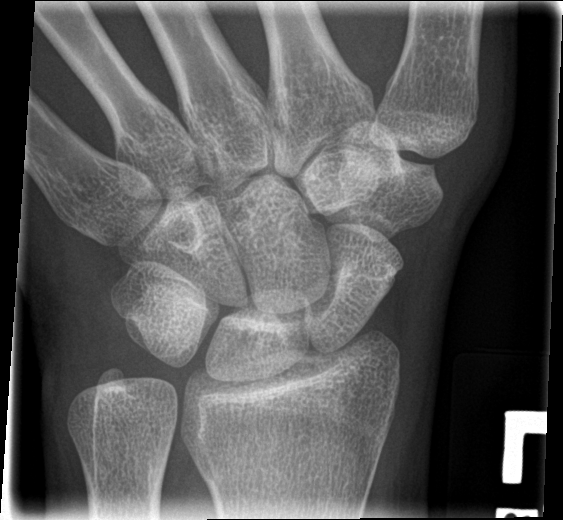

[4 of 4 positions shown; findings below may reference images not displayed]

FINDINGS: There is no evidence of fracture or dislocation. There is no
evidence of arthropathy or other focal bone abnormality. Soft
tissues are unremarkable.
IMPRESSION: Negative.

## 2019-07-26 ENCOUNTER — Emergency Department
Admission: EM | Admit: 2019-07-26 | Discharge: 2019-07-26 | Disposition: A | Discharge status: Home / Self Care | Payer: Medicaid Other | Attending: Emergency Medicine | Admitting: Emergency Medicine

## 2019-07-26 ENCOUNTER — Other Ambulatory Visit: Payer: Self-pay

## 2019-07-26 ENCOUNTER — Encounter: Payer: Self-pay | Admitting: Emergency Medicine

## 2019-07-26 DIAGNOSIS — M25511 Pain in right shoulder: Secondary | ICD-10-CM | POA: Diagnosis present

## 2019-07-26 DIAGNOSIS — J45909 Unspecified asthma, uncomplicated: Secondary | ICD-10-CM | POA: Insufficient documentation

## 2019-07-26 DIAGNOSIS — F17210 Nicotine dependence, cigarettes, uncomplicated: Secondary | ICD-10-CM | POA: Insufficient documentation

## 2019-07-26 DIAGNOSIS — M62838 Other muscle spasm: Secondary | ICD-10-CM | POA: Diagnosis not present

## 2019-07-26 DIAGNOSIS — I10 Essential (primary) hypertension: Secondary | ICD-10-CM | POA: Diagnosis not present

## 2019-07-26 MED ORDER — MELOXICAM 15 MG PO TABS: 15.0000 mg | ORAL_TABLET | Freq: Every day | ORAL | 2 refills | Status: AC

## 2019-07-26 MED ORDER — CYCLOBENZAPRINE HCL 10 MG PO TABS: 10.0000 mg | ORAL_TABLET | Freq: Three times a day (TID) | ORAL | 0 refills | Status: DC | PRN

## 2019-07-26 NOTE — ED Notes (Signed)
Pt c/o R shoulder pain. 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 Has tried ibuprofen, tylenol, and heat at home with no relief. A&O, ambulatory. No distress noted.

## 2019-07-26 NOTE — ED Provider Notes (Signed)
[facility] Emergency Department Provider Note  ____________________________________________   First MD Initiated Contact with Patient 07/26/19 1826     (approximate)  I have reviewed the triage vital signs and the nursing notes.   HISTORY  Chief Complaint Shoulder Pain    HPI Nancy Ball is a 23 y.o. female presents emergency department complaint of right shoulder pain.  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  She also has small children and does a lot of lifting.  She tried over-the-counter ibuprofen and Tylenol without any relief.  She denies any numbness or tingling.    Past Medical History:  Diagnosis Date  . Anemia during pregnancy   . 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 Asthma    well controlled, last episode in high school with running track  . Depression    PP depression, on zoloft presently  . Hypertension   . Postpartum depression     Patient Active Problem List   Diagnosis Date Noted  . Non-reassuring electronic fetal monitoring tracing 09/23/2018  . Pregnancy 07/09/2018  . History of depression 08/06/2017  . Postpartum care following vaginal delivery 07/31/2017  . Anemia complicating pregnancy, third trimester 06/03/2017  . History of pre-eclampsia 02/23/2017  . Asthma 02/23/2017    Past Surgical History:  Procedure Laterality Date  . WISDOM TOOTH EXTRACTION Bilateral 2013   states she was put to sleep    Prior to Admission medications   Medication Sig Start Date End Date Taking? Authorizing Provider  acetaminophen (TYLENOL) 325 MG tablet Take 2 tablets (650 mg total) by mouth every 4 (four) hours as needed for mild pain or moderate pain. 09/25/18   Genia DelHaviland, Margaret, CNM  cyclobenzaprine (FLEXERIL) 10 MG tablet Take 1 tablet (10 mg total) by mouth 3 (three) times daily as needed. 07/26/19    , Roselyn BeringSusan W, PA-C  ibuprofen (ADVIL,MOTRIN) 600 MG tablet Take 1 tablet (600 mg total) by mouth every 6 (six) hours as needed for mild pain, moderate pain or cramping. 09/25/18   Genia DelHaviland, Margaret, CNM  meloxicam (MOBIC) 15 MG tablet Take 1 tablet (15 mg total) by mouth daily. 07/26/19 07/25/20  , Roselyn BeringSusan W, PA-C  Prenatal Vit-Fe Fumarate-FA (MULTIVITAMIN-PRENATAL) 27-0.8 MG TABS tablet Take 1 tablet by mouth daily at 12 noon.    [provider]    Allergies Shellfish allergy and Excedrin migraine [aspirin-acetaminophen-caffeine]  Family History  Problem Relation Age of Onset  . Cancer Paternal Aunt     Social History Social History   Tobacco Use  . Smoking status: Current Every Day Smoker    Packs/day: 0.50    Years: 2.00    Pack years: 1.00    Types: Cigarettes    Last attempt to quit: 12/05/2016    Years since quitting: 2.6  . Smokeless tobacco: Never Used  Substance Use Topics  . Alcohol use: Yes  . Drug use: No    Types: Marijuana    Review of Systems  Constitutional: No fever/chills Eyes: No visual changes. ENT: No sore throat. Respiratory: Denies cough Genitourinary: Negative for dysuria. Musculoskeletal: Negative for back pain.  Positive for right shoulder pain Skin: Negative for rash.    ____________________________________________   PHYSICAL EXAM:  VITAL SIGNS: ED Triage Vitals  Enc Vitals Group     BP 07/26/19 1806 133/79  Pulse Rate 07/26/19 1806 77     Resp 07/26/19 1806 16     Temp --      Temp src --      SpO2 07/26/19 1806 100 %     Weight 07/26/19 1807 132 lb (59.9 kg)     Height 07/26/19 1807 4\' 11"  (1.499 m)     Head Circumference --      Peak Flow --      Pain Score 07/26/19 1807 9     Pain Loc --      Pain Edu? --      Excl. in [place]? --     Constitutional: Alert and oriented. Well appearing and in no acute distress. Eyes: Conjunctivae are normal.  Head: Atraumatic. Nose: No congestion/rhinnorhea. Mouth/Throat:  Mucous membranes are moist.   Neck:  supple no lymphadenopathy noted Cardiovascular: Normal rate, regular rhythm.  Respiratory: Normal respiratory effort.  No retractions,  GU: deferred Musculoskeletal: Decreased range of motion of the right shoulder secondary to discomfort.  Large spasm noted in the supraspinatus and the levator scapula, area is tender at the bursa of the scapula also.  Neurovascular is intact.  Neurologic:  Normal speech and language.  Skin:  Skin is warm, dry and intact. No rash noted. Psychiatric: Mood and affect are normal. Speech and behavior are normal.  ____________________________________________   LABS (all labs ordered are listed, but only abnormal results are displayed)  Labs Reviewed - No data to display ____________________________________________   ____________________________________________  RADIOLOGY    ____________________________________________   PROCEDURES  Procedure(s) performed: Sling applied by nursing staff   Procedures    ____________________________________________   INITIAL IMPRESSION / ASSESSMENT AND PLAN / ED COURSE  Pertinent labs & imaging results that were available during my care of the patient were reviewed by me and considered in my medical decision making (see chart for details).   Patient is 23 year old female presents emergency department complaint of right shoulder pain.  Physical exam shows her right shoulder to be tender with spasms and decreased range of motion secondary to discomfort.  Explained findings to the patient.  She was placed on meloxicam and Flexeril.  She is to follow-up with her regular doctor or orthopedics if not better in 5 to 7 days.  Also given a sling for comfort that she was instructed to only wear for 3 days.  She is to apply ice to the right shoulder.  She was discharged in stable condition.  She was also given a work note.    Nancy Ball was evaluated in Emergency Department on  07/26/2019 for the symptoms described in the history of present illness. She was evaluated in the context of the global COVID-19 pandemic, which necessitated consideration that the patient might be at risk for infection with the SARS-CoV-2 virus that causes COVID-19. Institutional protocols and algorithms that pertain to the evaluation of patients at risk for COVID-19 are in a state of rapid change based on information released by regulatory bodies including the CDC and federal and state organizations. These policies and algorithms were followed during the patient's care in the ED.   As part of my medical decision making, I reviewed the following data within the [facility] notes reviewed and incorporated, Old chart reviewed, Notes from prior ED visits and [place] Controlled Substance Database  ____________________________________________   FINAL CLINICAL IMPRESSION(S) / ED DIAGNOSES  Final diagnoses:  Acute pain of right shoulder  Muscle spasm  NEW MEDICATIONS STARTED DURING THIS VISIT:  New Prescriptions   CYCLOBENZAPRINE (FLEXERIL) 10 MG TABLET    Take 1 tablet (10 mg total) by mouth 3 (three) times daily as needed.   MELOXICAM (MOBIC) 15 MG TABLET    Take 1 tablet (15 mg total) by mouth daily.     Note:  This document was prepared using Dragon voice recognition software and may include unintentional dictation errors.    Faythe GheeFisher,  W, PA-C 07/26/19 Josephina [id number]    Paduchowski, Kevin, MD 07/26/19 2019

## 2019-07-26 NOTE — Discharge Instructions (Signed)
Follow-up with orthopedics if you are not better in 3 to 5 days.  Please call them for an appointment.  Use ice on the shoulder area to decrease inflammation.  Wear the sling for 3 days.  Take medications as prescribed.  If you are worsening please return the emergency department.

## 2019-07-26 NOTE — ED Triage Notes (Signed)
PT arrives with complaints of right sided shoulder pain that started Friday night.

## 2019-10-16 ENCOUNTER — Emergency Department
Admission: EM | Admit: 2019-10-16 | Discharge: 2019-10-16 | Disposition: A | Discharge status: Home / Self Care | Payer: Medicaid Other | Attending: Emergency Medicine | Admitting: Emergency Medicine

## 2019-10-16 ENCOUNTER — Other Ambulatory Visit: Payer: Self-pay

## 2019-10-16 DIAGNOSIS — R05 Cough: Secondary | ICD-10-CM | POA: Diagnosis present

## 2019-10-16 DIAGNOSIS — Z5321 Procedure and treatment not carried out due to patient leaving prior to being seen by health care provider: Secondary | ICD-10-CM | POA: Diagnosis not present

## 2019-10-16 DIAGNOSIS — R509 Fever, unspecified: Secondary | ICD-10-CM | POA: Insufficient documentation

## 2019-10-16 NOTE — ED Notes (Addendum)
Pt seen walking out lobby door carrying pt Nancy Ball att, unable to intervene d/t attempting to calm another pt

## 2019-10-16 NOTE — ED Triage Notes (Signed)
Patient reports running fever yesterday and cough.  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

## 2019-10-17 ENCOUNTER — Emergency Department: Payer: Medicaid Other

## 2019-10-17 ENCOUNTER — Encounter: Payer: Self-pay | Admitting: Emergency Medicine

## 2019-10-17 ENCOUNTER — Emergency Department
Admission: EM | Admit: 2019-10-17 | Discharge: 2019-10-17 | Disposition: A | Discharge status: Home / Self Care | Payer: Medicaid Other | Attending: Emergency Medicine | Admitting: Emergency Medicine

## 2019-10-17 ENCOUNTER — Other Ambulatory Visit: Payer: Self-pay

## 2019-10-17 DIAGNOSIS — J45909 Unspecified asthma, uncomplicated: Secondary | ICD-10-CM | POA: Diagnosis not present

## 2019-10-17 DIAGNOSIS — I10 Essential (primary) hypertension: Secondary | ICD-10-CM | POA: Insufficient documentation

## 2019-10-17 DIAGNOSIS — F17210 Nicotine dependence, cigarettes, uncomplicated: Secondary | ICD-10-CM | POA: Diagnosis not present

## 2019-10-17 DIAGNOSIS — J069 Acute upper respiratory infection, unspecified: Secondary | ICD-10-CM | POA: Diagnosis not present

## 2019-10-17 DIAGNOSIS — M791 Myalgia, unspecified site: Secondary | ICD-10-CM | POA: Diagnosis present

## 2019-10-17 DIAGNOSIS — R509 Fever, unspecified: Secondary | ICD-10-CM | POA: Diagnosis not present

## 2019-10-17 LAB — INFLUENZA PANEL BY PCR (TYPE A & B)
Influenza A By PCR: NEGATIVE
Influenza B By PCR: NEGATIVE

## 2019-10-17 NOTE — ED Triage Notes (Signed)
Presents with fever,body aches and cough  States she was exposed to COVID at work  But was told today that she was negative

## 2019-10-17 NOTE — ED Provider Notes (Signed)
[facility] Emergency Department Provider Note  ____________________________________________  Time seen: Approximately 3:44 PM  I have reviewed the triage vital signs and the nursing notes.   HISTORY  Chief Complaint Generalized Body Aches and Fever    HPI Nancy Ball is a 23 y.o. female who presents the emergency department complaining of fevers, nasal congestion, body aches, cough.  Patient reports that she has had 3 to 4 days of symptoms.  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  Patient is routinely tested twice a week and states that her test on Friday returned negative today.  Patient has had low-grade fever, generalized body aches.  She denies any headache, neck pain or stiffness, chest pain, shortness of breath, domino pain, nausea vomiting, diarrhea or constipation.  Over-the-counter medications for symptoms.  Mother is also concerned that she has been exposed to influenza reportedly at a long-term care facility as well and has not received flu shot yet.  Patient arrives with her  23-year-old son who is experiencing similar symptoms.        Past Medical History:  Diagnosis Date  . Anemia during pregnancy   . 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 Asthma    well controlled, last episode in high school with running track  . Depression    PP depression, on zoloft presently  . Hypertension   . Postpartum depression     Patient Active Problem List   Diagnosis Date Noted  . Non-reassuring electronic fetal monitoring tracing 09/23/2018  . Pregnancy 07/09/2018  . History of depression 08/06/2017  . Postpartum care following vaginal delivery 07/31/2017  . Anemia complicating pregnancy, third trimester 06/03/2017  . History of pre-eclampsia 02/23/2017  . Asthma 02/23/2017    Past Surgical History:  Procedure Laterality Date  . WISDOM TOOTH EXTRACTION  Bilateral 2013   states she was put to sleep    Prior to Admission medications   Medication Sig Start Date End Date Taking? Authorizing Provider  acetaminophen (TYLENOL) 325 MG tablet Take 2 tablets (650 mg total) by mouth every 4 (four) hours as needed for mild pain or moderate pain. 09/25/18   Genia Del, CNM  meloxicam (MOBIC) 15 MG tablet Take 1 tablet (15 mg total) by mouth daily. 07/26/19 07/25/20  Fisher, Roselyn Bering, PA-C  Prenatal Vit-Fe Fumarate-FA (MULTIVITAMIN-PRENATAL) 27-0.8 MG TABS tablet Take 1 tablet by mouth daily at 12 noon.    [provider]    Allergies Shellfish allergy and Excedrin migraine [aspirin-acetaminophen-caffeine]  Family History  Problem Relation Age of Onset  . Cancer Paternal Aunt     Social History Social History   Tobacco Use  . Smoking status: Current Every Day Smoker    Packs/day: 0.50    Years: 2.00    Pack years: 1.00    Types: Cigarettes    Last attempt to quit: 12/05/2016    Years since quitting: 2.8  . Smokeless tobacco: Never Used  Substance Use Topics  . Alcohol use: Yes  . Drug use: No    Types: Marijuana     Review of Systems  Constitutional: Positive fever/chills Eyes: No visual changes. No discharge ENT: Positive for nasal congestion. Cardiovascular: no chest pain. Respiratory: Positive cough. No SOB. Gastrointestinal: No abdominal pain.  No nausea, no vomiting.  No diarrhea.  No constipation. Musculoskeletal: Negative for musculoskeletal pain. Skin: Negative for rash, abrasions, lacerations, ecchymosis. Neurological: Negative for headaches, focal weakness  or numbness. 10-point ROS otherwise negative.  ____________________________________________   PHYSICAL EXAM:  VITAL SIGNS: ED Triage Vitals  Enc Vitals Group     BP 10/17/19 1517 (!) 108/59     Pulse Rate 10/17/19 1517 94     Resp 10/17/19 1517 16     Temp 10/17/19 1517 100.3 F (37.9 C)     Temp Source 10/17/19 1517 Oral     SpO2 10/17/19  1517 99 %     Weight 10/17/19 1519 115 lb (52.2 kg)     Height 10/17/19 1519 4\' 11"  (1.499 m)     Head Circumference --      Peak Flow --      Pain Score 10/17/19 1534 3     Pain Loc --      Pain Edu? --      Excl. in GC? --      Constitutional: Alert and oriented. Well appearing and in no acute distress. Eyes: Conjunctivae are normal. PERRL. EOMI. Head: Atraumatic. ENT:      Ears:       Nose: No congestion/rhinnorhea.      Mouth/Throat: Mucous membranes are moist.  Oropharynx is nonerythematous and nonedematous.  Uvula is midline. Neck: No stridor.  Neck is supple full range of motion Hematological/Lymphatic/Immunilogical: No cervical lymphadenopathy. Cardiovascular: Normal rate, regular rhythm. Normal S1 and S2.  Good peripheral circulation. Respiratory: Normal respiratory effort without tachypnea or retractions. Lungs CTAB. Good air entry to the bases with no decreased or absent breath sounds. Gastrointestinal: Bowel sounds 4 quadrants. Soft and nontender to palpation. No guarding or rigidity. No palpable masses. No distention.  Musculoskeletal: Full range of motion to all extremities. No gross deformities appreciated. Neurologic:  Normal speech and language. No gross focal neurologic deficits are appreciated.  Skin:  Skin is warm, dry and intact. No rash noted. Psychiatric: Mood and affect are normal. Speech and behavior are normal. Patient exhibits appropriate insight and judgement.   ____________________________________________   LABS (all labs ordered are listed, but only abnormal results are displayed)  Labs Reviewed  INFLUENZA PANEL BY PCR (TYPE A & B)   ____________________________________________  EKG   ____________________________________________  RADIOLOGY   Dg Chest 1 View  Result Date: 10/17/2019 CLINICAL DATA:  Presents with fever,body aches and cough States she was exposed to COVID at work But was told today that she was negative EXAM: CHEST  1  VIEW COMPARISON:  Chest radiograph 10/23/2013 FINDINGS: The heart size and mediastinal contours are within normal limits. The lungs are clear. No pneumothorax or pleural effusion. The visualized skeletal structures are unremarkable. IMPRESSION: No evidence of active disease in the chest. Electronically Signed   By: Emmaline KluverNancy  Ballantyne M.D.   On: 10/17/2019 16:16    ____________________________________________    PROCEDURES  Procedure(s) performed:    Procedures    Medications - No data to display   ____________________________________________   INITIAL IMPRESSION / ASSESSMENT AND PLAN / ED COURSE  Pertinent labs & imaging results that were available during my care of the patient were reviewed by me and considered in my medical decision making (see chart for details).  Review of the [place] CSRS was performed in accordance of the [facility] prior to dispensing any controlled drugs.           Patient's diagnosis is consistent with viral respiratory illness.  Patient presented to the emergency department with several days of viral URI symptoms.  Patient had a recent negative COVID-19 test.  Patient also states she  was exposed to influenza and her son had similar symptoms.  Influenza test is negative.  Chest x-ray is unremarkable.  Over-the-counter medications for symptom relief.  Follow-up primary care as needed..Patient is given ED precautions to return to the ED for any worsening or new symptoms.     ____________________________________________  FINAL CLINICAL IMPRESSION(S) / ED DIAGNOSES  Final diagnoses:  Viral upper respiratory tract infection      NEW MEDICATIONS STARTED DURING THIS VISIT:  ED Discharge Orders    None          This chart was dictated using voice recognition software/Dragon. Despite best efforts to proofread, errors can occur which can change the meaning. Any change was purely unintentional.    Darletta Moll, PA-C 10/17/19 2327     Nena Polio, MD 10/17/19 [phone]

## 2020-07-11 ENCOUNTER — Telehealth: Payer: Medicaid Other

## 2020-08-04 ENCOUNTER — Other Ambulatory Visit: Payer: Self-pay

## 2020-08-04 DIAGNOSIS — F17210 Nicotine dependence, cigarettes, uncomplicated: Secondary | ICD-10-CM | POA: Diagnosis not present

## 2020-08-04 DIAGNOSIS — I10 Essential (primary) hypertension: Secondary | ICD-10-CM | POA: Diagnosis not present

## 2020-08-04 DIAGNOSIS — R0981 Nasal congestion: Secondary | ICD-10-CM | POA: Diagnosis present

## 2020-08-04 DIAGNOSIS — R110 Nausea: Secondary | ICD-10-CM | POA: Diagnosis not present

## 2020-08-04 DIAGNOSIS — J069 Acute upper respiratory infection, unspecified: Secondary | ICD-10-CM | POA: Diagnosis not present

## 2020-08-04 DIAGNOSIS — J329 Chronic sinusitis, unspecified: Secondary | ICD-10-CM | POA: Diagnosis not present

## 2020-08-04 DIAGNOSIS — J45909 Unspecified asthma, uncomplicated: Secondary | ICD-10-CM | POA: Diagnosis not present

## 2020-08-04 NOTE — ED Triage Notes (Signed)
Patient reports having sinus pressure and congestion for approximately a week.

## 2020-08-05 ENCOUNTER — Emergency Department
Admission: EM | Admit: 2020-08-05 | Discharge: 2020-08-05 | Disposition: A | Discharge status: Home / Self Care | Payer: Medicaid Other | Attending: Emergency Medicine | Admitting: Emergency Medicine

## 2020-08-05 DIAGNOSIS — R0981 Nasal congestion: Secondary | ICD-10-CM

## 2020-08-05 DIAGNOSIS — J069 Acute upper respiratory infection, unspecified: Secondary | ICD-10-CM

## 2020-08-05 MED ORDER — IBUPROFEN 600 MG PO TABS
600.0000 mg | ORAL_TABLET | Freq: Once | ORAL | Status: AC
  Administered 2020-08-05: 600 mg via ORAL
  Filled 2020-08-05: qty 1

## 2020-08-05 MED ORDER — GUAIFENESIN ER 600 MG PO TB12
1200.0000 mg | ORAL_TABLET | Freq: Once | ORAL | Status: AC
  Administered 2020-08-05: 1200 mg via ORAL
  Filled 2020-08-05: qty 2

## 2020-08-05 MED ORDER — ACETAMINOPHEN 500 MG PO TABS
1000.0000 mg | ORAL_TABLET | Freq: Once | ORAL | Status: AC
  Administered 2020-08-05: 1000 mg via ORAL
  Filled 2020-08-05: qty 2

## 2020-08-05 NOTE — Discharge Instructions (Addendum)
Please take Tylenol and ibuprofen/Advil for your pain.  It is safe to take them together, or to alternate them every few hours.  Take up to 1000mg  of Tylenol at a time, up to 4 times per day.  Do not take more than 4000 mg of Tylenol in 24 hours.  For ibuprofen, take 400-600 mg, 4-5 times per day.  You can also get some Mucinex, or the generic equivalent "guaifenesin," take 600-1200mg  per dose, 2 times per day for the longer acting Brand name or 4 times per day for the shorter acting generic equivalent.  This will help thin the mucus within your nose, and that you blow it out more easily.  Be sure to drink plenty of water while taking this medication.  If you develop any worsening symptoms despite these medications, high fevers, productive cough, please return to the ED.

## 2020-08-05 NOTE — ED Provider Notes (Signed)
[facility] Emergency Department Provider Note ____________________________________________   First MD Initiated Contact with Patient 08/05/20 [phone]     (approximate)  I have reviewed the triage vital signs and the nursing notes.  HISTORY  Chief Complaint Nasal Congestion   HPI Nancy Ball is a 24 y.o. femalewho presents to the ED for evaluation of nasal congestion  Chart review indicates no relevant PMHx.    Patient reports about 1 week of upper respiratory congestion and sinus pressure.  She reports clear rhinorrhea that is recently transitioned to green in color, thick consistency.  She reports associated maxillary sinus pain, left greater than right, up to 5/10 intensity aching pain.  Improved with home ibuprofen.  She reports associated postnasal drip and occasional nausea that she attributes to her "mucus going down."  Patient denies any fevers, abdominal pain, chest pain, productive cough, shortness of breath.  She reports being tested for COVID-19 2 times in the past 1 week through her job, both negative.  Past Medical History:  Diagnosis Date  . Anemia during pregnancy   . 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 Asthma    well controlled, last episode in high school with running track  . Depression    PP depression, on zoloft presently  . Hypertension   . Postpartum depression     Patient Active Problem List   Diagnosis Date Noted  . Non-reassuring electronic fetal monitoring tracing 09/23/2018  . Pregnancy 07/09/2018  . History of depression 08/06/2017  . Postpartum care following vaginal delivery 07/31/2017  . Anemia complicating pregnancy, third trimester 06/03/2017  . History of pre-eclampsia 02/23/2017  . Asthma 02/23/2017    Past Surgical History:  Procedure Laterality Date  . WISDOM TOOTH EXTRACTION Bilateral 2013   states she was put to sleep    Prior to Admission  medications   Medication Sig Start Date End Date Taking? Authorizing Provider  acetaminophen (TYLENOL) 325 MG tablet Take 2 tablets (650 mg total) by mouth every 4 (four) hours as needed for mild pain or moderate pain. 09/25/18   Genia Del, CNM  Prenatal Vit-Fe Fumarate-FA (MULTIVITAMIN-PRENATAL) 27-0.8 MG TABS tablet Take 1 tablet by mouth daily at 12 noon.    [provider]    Allergies Shellfish allergy and Excedrin migraine [aspirin-acetaminophen-caffeine]  Family History  Problem Relation Age of Onset  . Cancer Paternal Aunt     Social History Social History   Tobacco Use  . Smoking status: Current Every Day Smoker    Packs/day: 0.50    Years: 2.00    Pack years: 1.00    Types: Cigarettes    Last attempt to quit: 12/05/2016    Years since quitting: 3.6  . Smokeless tobacco: Never Used  Substance Use Topics  . Alcohol use: Yes  . Drug use: No    Types: Marijuana    Review of Systems  Constitutional: No fever/chills Eyes: No visual changes. ENT: No sore throat.  Positive for upper respiratory congestion, rhinorrhea and postnasal drip. Cardiovascular: Denies chest pain. Respiratory: Denies shortness of breath. Gastrointestinal: No abdominal pain.  No nausea, no vomiting.  No diarrhea.  No constipation. Genitourinary: Negative for dysuria. Musculoskeletal: Negative for back pain. Skin: Negative for rash. Neurological: Negative for headaches, focal weakness or numbness.   ____________________________________________   PHYSICAL EXAM:  VITAL SIGNS: Vitals:   08/05/20 1019 08/05/20 1049  BP: 117/70 120/70  Pulse: 70 70  Resp: 17  18  Temp: 98.2 F (36.8 C) 98.3 F (36.8 C)  SpO2: 98% 99%      Constitutional: Alert and oriented. Well appearing and in no acute distress.  Conversational in full sentences.  Holding her 53-year-old toddler in her arms.  Eyes: Conjunctivae are normal. PERRL. EOMI and do not elicit pain. Head: Atraumatic. Nose:  Congestion and clear rhinorrhea is present.  Mild left maxillary sinus tenderness to palpation Mouth/Throat: Mucous membranes are moist.  Oropharynx non-erythematous. Neck: No stridor. No cervical spine tenderness to palpation. Cardiovascular: Normal rate, regular rhythm. Grossly normal heart sounds.  Good peripheral circulation. Respiratory: Normal respiratory effort.  No retractions. Lungs CTAB. Gastrointestinal: Soft , nondistended, nontender to palpation. No abdominal bruits. No CVA tenderness. Musculoskeletal: No lower extremity tenderness nor edema.  No joint effusions. No signs of acute trauma. Neurologic:  Normal speech and language. No gross focal neurologic deficits are appreciated. No gait instability noted. Skin:  Skin is warm, dry and intact. No rash noted. Psychiatric: Mood and affect are normal. Speech and behavior are normal.  ____________________________________________   PROCEDURES and INTERVENTIONS  Procedure(s) performed (including Critical Care):  Procedures  Medications  acetaminophen (TYLENOL) tablet 1,000 mg (1,000 mg Oral Given 08/05/20 1009)  ibuprofen (ADVIL) tablet 600 mg (600 mg Oral Given 08/05/20 1009)  guaiFENesin (MUCINEX) 12 hr tablet 1,200 mg (1,200 mg Oral Given 08/05/20 1009)    ____________________________________________   INITIAL IMPRESSION / ASSESSMENT AND PLAN / ED COURSE  Otherwise healthy 24 year old female presenting with upper respiratory congestion and sinus pain most consistent with a viral URI, and amenable to outpatient management.  Normal vital signs and room air.  Reassuring examination without evidence of distress or serious illness.  She does have some upper respiratory congestion, clear rhinorrhea, and mild tenderness to her left maxillary sinus.  No evidence of EOM entrapment, peritonsillar abscess or pneumonia.  No indications for blood work at this time.  She is extremely unlikely to have a bacterial sinusitis, she is only been  symptomatic for 1 week, with no classic prolonged course with re-worsening to suggest a bacterial superinfection.  We discussed OTC medications for symptom control, we discussed return precautions for the ED.  Patient provided Tylenol, NSAID and guaifenesin here in the ED with improving symptoms.  Patient medically stable for discharge home.  ____________________________________________   FINAL CLINICAL IMPRESSION(S) / ED DIAGNOSES  Final diagnoses:  Viral URI with cough  Viral upper respiratory tract infection  Nasal congestion     ED Discharge Orders    None       Saga Balthazar   Note:  This document was prepared using Dragon voice recognition software and may include unintentional dictation errors.   Delton [place]irie, MD 08/05/20 1059

## 2020-08-05 NOTE — ED Notes (Signed)
E-signature not working at this time. Pt verbalized understanding of D/C instructions, prescriptions and follow up care with no further questions at this time. Pt in NAD and ambulatory at time of D/C.  

## 2020-12-26 ENCOUNTER — Ambulatory Visit
Admission: EM | Admit: 2020-12-26 | Discharge: 2020-12-26 | Disposition: A | Discharge status: Home / Self Care | Payer: Medicaid Other | Attending: Sports Medicine | Admitting: Sports Medicine

## 2020-12-26 ENCOUNTER — Other Ambulatory Visit: Payer: Self-pay

## 2020-12-26 DIAGNOSIS — L03317 Cellulitis of buttock: Secondary | ICD-10-CM

## 2020-12-26 DIAGNOSIS — L0231 Cutaneous abscess of buttock: Secondary | ICD-10-CM

## 2020-12-26 MED ORDER — CEPHALEXIN 500 MG PO CAPS: 500.0000 mg | ORAL_CAPSULE | Freq: Four times a day (QID) | ORAL | 0 refills | Status: DC

## 2020-12-26 NOTE — Discharge Instructions (Addendum)
Given that I cannot incise and drain it I recommended just empirically treating her with Keflex.  Gave her prescription. Recommended warm baths and heating pad to try to bring some of that material to the surface.  I warned her not to squeeze it and just allow it to express itself on its own. Offered a work note she deferred on that. Over-the-counter meds as needed for any pain or discomfort. Certainly if she was to worsen or developed any concern for an abscess that needed to be drained she can come back here, seek care at her primary care physician's office or go to the emergency room.

## 2020-12-26 NOTE — ED Triage Notes (Signed)
Patient complains of right buttocks abscess that started on 1/2. States that the area has not drained and has been very painful.
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
## 2021-05-22 DIAGNOSIS — Z8619 Personal history of other infectious and parasitic diseases: Secondary | ICD-10-CM

## 2021-05-22 DIAGNOSIS — Z8759 Personal history of other complications of pregnancy, childbirth and the puerperium: Secondary | ICD-10-CM

## 2021-05-22 HISTORY — DX: Personal history of other infectious and parasitic diseases: Z86.19

## 2021-05-22 HISTORY — DX: Personal history of other complications of pregnancy, childbirth and the puerperium: Z87.59

## 2022-03-04 ENCOUNTER — Encounter: Payer: Self-pay | Admitting: *Deleted

## 2022-03-04 ENCOUNTER — Other Ambulatory Visit: Payer: Self-pay

## 2022-03-04 ENCOUNTER — Emergency Department
Admission: EM | Admit: 2022-03-04 | Discharge: 2022-03-04 | Disposition: A | Discharge status: Home / Self Care | Payer: Medicaid Other | Attending: Emergency Medicine | Admitting: Emergency Medicine

## 2022-03-04 DIAGNOSIS — J45909 Unspecified asthma, uncomplicated: Secondary | ICD-10-CM | POA: Diagnosis not present

## 2022-03-04 DIAGNOSIS — H00015 Hordeolum externum left lower eyelid: Secondary | ICD-10-CM | POA: Diagnosis not present

## 2022-03-04 DIAGNOSIS — H0289 Other specified disorders of eyelid: Secondary | ICD-10-CM | POA: Diagnosis present

## 2022-03-04 MED ORDER — DICLOXACILLIN SODIUM 250 MG PO CAPS: 250.0000 mg | ORAL_CAPSULE | Freq: Four times a day (QID) | ORAL | 0 refills | Status: AC

## 2022-03-04 NOTE — Discharge Instructions (Addendum)
-  Continue using warm compresses as needed ?-Take all of antibiotics as prescribed. ?-Follow-up with the ophthalmologist listed above if your symptoms fail to improve in a few days. ?

## 2022-03-04 NOTE — ED Triage Notes (Signed)
Pt has swelling to left eye lower lid for 4 days   pt has pain and some drainage.  Pt alert.   ?

## 2022-03-04 NOTE — ED Notes (Signed)
Dc instructions and scripts reviewed with pt no questions or concerns at this time.  

## 2022-03-04 NOTE — ED Provider Notes (Signed)
? ?[facility] ?Provider Note ? ? ? Event Date/Time  ? First MD Initiated Contact with Patient 03/04/22 1638   ?  (approximate) ? ? ?History  ? ?Chief Complaint ?[place]e ? ? ?HPI ?Nancy Ball is a 26 y.o. female, history of asthma, depression, arthritis, presents to the emergency department for evaluation of eyelid swelling.  Patient states that this been going on for the past 4 days, endorsing progressive swelling and redness along the lower eyelid.  She states that she has tried warm compresses at home, however this has had minimal relief.  Denies fever/chills, chest pain, shortness of breath, blurred vision, pain with extraocular movements, headache, urinary symptoms, or nausea/vomiting. ? ?History Limitations: No limitations. ? ?  ? ? ?Physical Exam  ?Triage Vital Signs: ?ED Triage Vitals  ?Enc Vitals Group  ?   BP 03/04/22 1634 129/74  ?   Pulse Rate 03/04/22 1634 72  ?   Resp 03/04/22 1634 16  ?   Temp 03/04/22 1634 98.1 ?F (36.7 ?C)  ?   Temp Source 03/04/22 1635 Oral  ?   SpO2 03/04/22 1634 100 %  ?   Weight 03/04/22 1635 124 lb (56.2 kg)  ?   Height 03/04/22 1635 4\' 11"  (1.499 m)  ?   Head Circumference --   ?   Peak Flow --   ?   Pain Score 03/04/22 1635 6  ?   Pain Loc --   ?   Pain Edu? --   ?   Excl. in [place]? --   ? ? ?Most recent vital signs: ?Vitals:  ? 03/04/22 1634 03/04/22 1635  ?BP: 129/74   ?Pulse: 72   ?Resp: 16   ?Temp: 98.1 ?F (36.7 ?C) (!) 97.1 ?F (36.2 ?C)  ?SpO2: 100%   ? ? ?General: Awake, NAD.  ?CV: Good peripheral perfusion.  ?Resp: Normal effort.  ?Abd: Soft, non-tender. No distention.  ?Neuro: At baseline. No gross neurological deficits.  ?Other: Significant swelling and erythema present along the lower eyelid, consistent with hordeolum. No active bleeding or discharge.  Lesion is large, but does not appear to be fluctuant. ? ?Physical Exam ? ? ? ?ED Results / Procedures / Treatments  ?Labs ?(all labs ordered are listed, but only abnormal results are  displayed) ?Labs Reviewed - No data to display ? ? ?EKG ?Not applicable ? ? ?RADIOLOGY ? ?ED Provider Interpretation: Not applicable ? ?No results found. ? ?PROCEDURES: ? ?Critical Care performed: None. ? ?Procedures ? ? ? ?MEDICATIONS ORDERED IN ED: ?Medications - No data to display ? ? ?IMPRESSION / MDM / ASSESSMENT AND PLAN / ED COURSE  ?I reviewed the triage vital signs and the nursing notes. ?             ?               ? ? ?Differential diagnosis includes, but is not limited to, preseptal cellulitis, orbital cellulitis, hordeolum, chalazion, blepharitis, conjunctivitis. ? ?Assessment/Plan ?Presentation consistent with hordeolum.  He does not fluctuant, though does appear to be particularly severe.  Will prescribe patient dicloxacillin and have her follow-up with ophthalmology if symptoms fail to improve.  We will discharge this patient ? ?Patient was provided with anticipatory guidance, return precautions, and educational material. Encouraged the patient to return to the emergency department at any time if they begin to experience any new or worsening symptoms.  ? ?  ? ? ?FINAL CLINICAL IMPRESSION(S) / ED DIAGNOSES  ? ?Final diagnoses:  ?  Hordeolum of left lower eyelid, unspecified hordeolum type  ? ? ? ?Rx / DC Orders  ? ?ED Discharge Orders   ? ?      Ordered  ?  dicloxacillin (DYNAPEN) 250 MG capsule  4 times daily       ? 03/04/22 1753  ? ?  ?  ? ?  ? ? ? ?Note:  This document was prepared using Dragon voice recognition software and may include unintentional dictation errors. ?  ?Teodoro Spray, [place] ?03/04/22 1758 ? ?  ?Harvest Dark, MD ?03/04/22 2336 ? ?

## 2022-03-26 ENCOUNTER — Other Ambulatory Visit: Payer: Self-pay

## 2022-03-26 ENCOUNTER — Encounter: Payer: Self-pay | Admitting: Emergency Medicine

## 2022-03-26 ENCOUNTER — Ambulatory Visit
Admission: EM | Admit: 2022-03-26 | Discharge: 2022-03-26 | Disposition: A | Discharge status: Home / Self Care | Payer: Medicaid Other | Attending: Physician Assistant | Admitting: Physician Assistant

## 2022-03-26 DIAGNOSIS — L02412 Cutaneous abscess of left axilla: Secondary | ICD-10-CM | POA: Diagnosis not present

## 2022-03-26 MED ORDER — DOXYCYCLINE HYCLATE 100 MG PO CAPS: 100.0000 mg | ORAL_CAPSULE | Freq: Two times a day (BID) | ORAL | 0 refills | Status: AC
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
## 2022-03-26 NOTE — Discharge Instructions (Addendum)
-  I have sent antibiotics to pharmacy.  Continue with warm compresses, Tylenol and Motrin as needed for pain.  This should be improving in the next 2 to 3 days but if it does not or is getting worse you should return as it may need to be incised and drained.  Not ready for that at this time. ?

## 2022-03-26 NOTE — ED Triage Notes (Signed)
Pt c/o abscess in her left axilla. Started about 3 days ago. She has tried warm compress. She states it is getting worse to where she can not lift her arm.  ?

## 2022-07-06 ENCOUNTER — Emergency Department
Admission: EM | Admit: 2022-07-06 | Discharge: 2022-07-06 | Disposition: A | Discharge status: Home / Self Care | Payer: Medicaid Other | Attending: Emergency Medicine | Admitting: Emergency Medicine

## 2022-07-06 ENCOUNTER — Other Ambulatory Visit: Payer: Self-pay

## 2022-07-06 ENCOUNTER — Emergency Department: Payer: Medicaid Other

## 2022-07-06 DIAGNOSIS — R824 Acetonuria: Secondary | ICD-10-CM | POA: Insufficient documentation

## 2022-07-06 DIAGNOSIS — J4521 Mild intermittent asthma with (acute) exacerbation: Secondary | ICD-10-CM | POA: Diagnosis not present

## 2022-07-06 DIAGNOSIS — R0602 Shortness of breath: Secondary | ICD-10-CM | POA: Diagnosis present

## 2022-07-06 HISTORY — DX: Systemic lupus erythematosus, unspecified: M32.9

## 2022-07-06 HISTORY — DX: Reserved for concepts with insufficient information to code with codable children: IMO0002

## 2022-07-06 LAB — COMPREHENSIVE METABOLIC PANEL
ALT: 13 U/L (ref 0–44)
AST: 19 U/L (ref 15–41)
Albumin: 4.7 g/dL (ref 3.5–5.0)
Alkaline Phosphatase: 69 U/L (ref 38–126)
Anion gap: 12 (ref 5–15)
BUN: 10 mg/dL (ref 6–20)
CO2: 17 mmol/L — ABNORMAL LOW (ref 22–32)
Calcium: 9.4 mg/dL (ref 8.9–10.3)
Chloride: 107 mmol/L (ref 98–111)
Creatinine, Ser: 0.54 mg/dL (ref 0.44–1.00)
GFR, Estimated: >60 mL/min (ref >60)
Glucose, Bld: 88 mg/dL (ref 70–99)
Potassium: 3.2 mmol/L — ABNORMAL LOW (ref 3.5–5.1)
Sodium: 136 mmol/L (ref 135–145)
Total Bilirubin: 0.5 mg/dL (ref 0.3–1.2)
Total Protein: 8.1 g/dL (ref 6.5–8.1)

## 2022-07-06 LAB — URINALYSIS, ROUTINE W REFLEX MICROSCOPIC
Bacteria, UA: NONE SEEN
Bilirubin Urine: NEGATIVE
Glucose, UA: NEGATIVE mg/dL
Hgb urine dipstick: NEGATIVE
Ketones, ur: 5 mg/dL — AB
Nitrite: NEGATIVE
Protein, ur: NEGATIVE mg/dL
Specific Gravity, Urine: 1.009 (ref 1.005–1.030)
pH: 7 (ref 5.0–8.0)

## 2022-07-06 LAB — CBC
HCT: 38.7 % (ref 36.0–46.0)
Hemoglobin: 13 g/dL (ref 12.0–15.0)
MCH: 29.3 pg (ref 26.0–34.0)
MCHC: 33.6 g/dL (ref 30.0–36.0)
MCV: 87.2 fL (ref 80.0–100.0)
Platelets: 326 10*3/uL (ref 150–400)
RBC: 4.44 MIL/uL (ref 3.87–5.11)
RDW: 12.8 % (ref 11.5–15.5)
WBC: 6.6 10*3/uL (ref 4.0–10.5)
nRBC: 0 % (ref 0.0–0.2)

## 2022-07-06 LAB — POC URINE PREG, ED: Preg Test, Ur: NEGATIVE

## 2022-07-06 LAB — TROPONIN I (HIGH SENSITIVITY)
Troponin I (High Sensitivity): <2 ng/L (ref <18)
Troponin I (High Sensitivity): <2 ng/L (ref <18)

## 2022-07-06 MED ORDER — PREDNISONE 20 MG PO TABS
40.0000 mg | ORAL_TABLET | Freq: Once | ORAL | Status: AC
Start: 2022-07-06 — End: 2022-07-06
  Administered 2022-07-06: 40 mg via ORAL
  Filled 2022-07-06: qty 2

## 2022-07-06 MED ORDER — IOHEXOL 350 MG/ML SOLN
75.0000 mL | Freq: Once | INTRAVENOUS | Status: AC | PRN
  Administered 2022-07-06: 75 mL via INTRAVENOUS

## 2022-07-06 MED ORDER — PREDNISONE 10 MG PO TABS
ORAL_TABLET | ORAL | 0 refills | Status: AC
Start: 2022-07-06 — End: 2022-07-17

## 2022-07-06 NOTE — ED Triage Notes (Signed)
Pt comes via [facility] with c/o SOB while at work pt does have hx of asthma. Pt states nausea nd dizziness. Pt states no inhaler for over 3 months. Pt did use someone else's inhaler while at work with no relief.  VSS

## 2022-07-06 NOTE — ED Notes (Signed)
Family here to visit.  Showed visitor to wait with patient

## 2022-07-06 NOTE — ED Provider Notes (Signed)
[facility] Provider Note    Event Date/Time   First MD Initiated Contact with Patient 07/06/22 1157     (approximate)   History   Shortness of Breath   HPI  Nancy Ball is a 26 y.o. female with past medical history of lupus, mild asthma, here with shortness of breath.  The patient states that she was at work earlier today.  She began to feel acute onset of shortness of breath and a sensation of chest heaviness.  She states that she has a history of asthma as well as lupus with similar symptoms.  She felt well prior to going to work.  She currently feels some mild ongoing chest pressure, but this is also not uncommon for her.  Denies any specific triggers but states she has been working more.  She is exposed to smoke where she works, she works in [employer].  Denies any current chest pain.  Denies any syncope but did have some lightheadedness.  Denies any known sick contacts.  No sputum production.  No recent leg swelling.  No history of PE.     Physical Exam   Triage Vital Signs: ED Triage Vitals  Enc Vitals Group     BP 07/06/22 0833 121/85     Pulse Rate 07/06/22 0833 84     Resp 07/06/22 0833 19     Temp 07/06/22 0833 98 F (36.7 C)     Temp Source 07/06/22 1020 Oral     SpO2 07/06/22 0833 100 %     Weight --      Height --      Head Circumference --      Peak Flow --      Pain Score 07/06/22 0831 7     Pain Loc --      Pain Edu? --      Excl. in GC? --     Most recent vital signs: Vitals:   07/06/22 1020 07/06/22 1332  BP: 112/76 106/74  Pulse: 65 73  Resp: 18 17  Temp: 97.6 F (36.4 C)   SpO2: 97% 98%     General: Awake, no distress.  CV:  Good peripheral perfusion.  Regular rate and rhythm. Resp:  Normal effort.  Lungs clear to auscultation bilaterally.  Specifically, no wheezes, rales, or rhonchi. Abd:  No distention.  No tenderness. Other:  No lower extremity edema or swelling.   ED Results / Procedures / Treatments    Labs (all labs ordered are listed, but only abnormal results are displayed) Labs Reviewed  COMPREHENSIVE METABOLIC PANEL - Abnormal; Notable for the following components:      Result Value   Potassium 3.2 (*)    CO2 17 (*)    All other components within normal limits  URINALYSIS, ROUTINE W REFLEX MICROSCOPIC - Abnormal; Notable for the following components:   Color, Urine YELLOW (*)    APPearance HAZY (*)    Ketones, ur 5 (*)    Leukocytes,Ua SMALL (*)    All other components within normal limits  CBC  POC URINE PREG, ED  TROPONIN I (HIGH SENSITIVITY)  TROPONIN I (HIGH SENSITIVITY)     EKG Normal sinus rhythm, ventricular rate 71.  PR 158, QRS 94, QTc 423.  No acute ST elevations or depressions.  No EKG evidence of acute ischemia or infarct   RADIOLOGY Chest x-ray: No active disease CT angio: No PE   I also independently reviewed and agree with radiologist interpretations.  PROCEDURES:  Critical Care performed: No    MEDICATIONS ORDERED IN ED: Medications  iohexol (OMNIPAQUE) 350 MG/ML injection 75 mL (75 mLs Intravenous Contrast Given 07/06/22 1217)  predniSONE (DELTASONE) tablet 40 mg (40 mg Oral Given 07/06/22 1231)     IMPRESSION / MDM / ASSESSMENT AND PLAN / ED COURSE  I reviewed the triage vital signs and the nursing notes.                                Ddx:  Differential includes the following, with pertinent life- or limb-threatening emergencies considered:  PNA, PTX, PE, asthma exacerbation, lupus pneumonitis, less likely ACS  Patient's presentation is most consistent with acute presentation with potential threat to life or bodily function.  MDM:  26 yo F with h/o SLE here with acute SOB and CP. Suspect mild bronchospasm from URI vs SLE exacerbation, h/o similar presentations. Pt is otherwise well appearing and in NAD. Satting well on RA. EKG is nonischemic and trop negative x2, do not suspect ACS. CBC shows no leukocytosis or anemia. CMP with  normal renal function and LFTs. UA with mild ketonuria. Denies any dysuria, frequency, or sx to suggest UTI or pyelo. CXR is clear CT-scan of the abdomenn agio negative.   Will tx with steroids, albuterol, outpt follow-up. Return precautions given.   MEDICATIONS GIVEN IN ED: Medications  iohexol (OMNIPAQUE) 350 MG/ML injection 75 mL (75 mLs Intravenous Contrast Given 07/06/22 1217)  predniSONE (DELTASONE) tablet 40 mg (40 mg Oral Given 07/06/22 1231)     Consults:     EMR reviewed       FINAL CLINICAL IMPRESSION(S) / ED DIAGNOSES   Final diagnoses:  Mild intermittent reactive airway disease with acute exacerbation     Rx / DC Orders   ED Discharge Orders          Ordered    predniSONE (DELTASONE) 10 MG tablet  Daily        07/06/22 1325             Note:  This document was prepared using Dragon voice recognition software and may include unintentional dictation errors.   Shaune Pollack, MD 07/06/22 2212

## 2022-07-06 NOTE — ED Provider Triage Note (Signed)
Emergency Medicine Provider Triage Evaluation Note  Nancy Ball , a 26 y.o. female  was evaluated in triage.  Pt complains of chest pain and shortness of breath.  History of lupus.  Positive smoker.  No birth control pills..  Review of Systems  Positive: Chest pain, shortness of breath Negative: Fever chills  Physical Exam  BP 121/85   Pulse 84   Temp 98 F (36.7 C)   Resp 19   SpO2 100%  Gen:   Awake, no distress   Resp:  Normal effort  MSK:   Moves extremities without difficulty  Other:    Medical Decision Making  Medically screening exam initiated at 8:35 AM.  Appropriate orders placed.  Nancy Ball was informed that the remainder of the evaluation will be completed by another provider, this initial triage assessment does not replace that evaluation, and the importance of remaining in the ED until their evaluation is complete.  On physical exam patient does not have any wheezing but appears to still be short of breath.  Due to her history of lupus, age and smoking feel that she will need a CTA for PE.   Faythe Ghee, PA-C 07/06/22 [phone]

## 2022-10-17 ENCOUNTER — Other Ambulatory Visit: Payer: Medicaid Other

## 2022-11-26 ENCOUNTER — Inpatient Hospital Stay
Admission: RE | Admit: 2022-11-26 | Discharge: 2022-11-26 | Disposition: A | Discharge status: Home / Self Care | Payer: Self-pay | Source: Ambulatory Visit

## 2022-11-27 ENCOUNTER — Ambulatory Visit: Payer: Self-pay

## 2022-12-22 NOTE — L&D Delivery Note (Signed)
Delivery Note   Nancy Ball is a 27 y.o. [id number] at [redacted]w[redacted]d Estimated Date of Delivery: 11/14/23  PRE-OPERATIVE DIAGNOSIS:  1) [redacted]w[redacted]d pregnancy.  2) Fetal growth restriction 3) Inadequate treatment for syphilis in pregnancy 4) Polysubstance use 4) Asthma  POST-OPERATIVE DIAGNOSIS:  1) [redacted]w[redacted]d pregnancy s/p Vaginal, Spontaneous And above  Delivery Type: Vaginal, Spontaneous   Delivery Anesthesia: Epidural  Labor Complications:  none    ESTIMATED BLOOD LOSS: 100 ml    FINDINGS:   1) female infant, Apgar scores of 8   at 1 minute and 9   at 5 minutes and a birthweight of 2420 g.  SPECIMENS:   PLACENTA:   Appearance: Intact   Removal: Spontaneous     Disposition:  discarded  CORD BLOOD: n/s  DISPOSITION:  Infant left in stable condition in the delivery room, with L&D personnel and mother,  NARRATIVE SUMMARY: Labor course:  Nancy Ball is a [id number] at [redacted]w[redacted]d who presented to Labor & Delivery for induction of labor due to fetal growth restriction. Her initial cervical exam was 2/50/-3. Labor proceeded with augmentation and she was found to be completely dilated at 1605. With excellent maternal pushing effort, she birthed a viable female infant at 108. There was not a nuchal cord. The shoulders were birthed without difficulty. The infant was placed skin-to-skin with mother. The cord was doubly clamped and cut when pulsations ceased. The placenta delivered spontaneously and was noted to be intact with a 3VC. A perineal and vaginal examination was performed. Episiotomy/Lacerations: None The patient tolerated this well. Mother and baby were left in stable condition.  Patient desires BTL. Case posted for 11/3 am with plan for patient to be NPO after midnight.  Dr. Lonny Prude was immediately available for the care of this patient and made aware of the delivery.   [name], CNM 10/24/2023 4:34 PM

## 2023-04-15 ENCOUNTER — Ambulatory Visit (LOCAL_COMMUNITY_HEALTH_CENTER): Payer: Medicaid Other

## 2023-04-15 VITALS — BP 116/71 | Ht 59.0 in | Wt 133.0 lb

## 2023-04-15 DIAGNOSIS — Z309 Encounter for contraceptive management, unspecified: Secondary | ICD-10-CM

## 2023-04-15 DIAGNOSIS — Z3201 Encounter for pregnancy test, result positive: Secondary | ICD-10-CM | POA: Diagnosis not present

## 2023-04-15 DIAGNOSIS — Z3009 Encounter for other general counseling and advice on contraception: Secondary | ICD-10-CM | POA: Diagnosis not present

## 2023-04-15 LAB — PREGNANCY, URINE: Preg Test, Ur: POSITIVE — AB

## 2023-04-15 MED ORDER — PRENATAL 27-0.8 MG PO TABS: 1.0000 | ORAL_TABLET | Freq: Every day | ORAL | 0 refills | Status: AC

## 2023-04-15 NOTE — Progress Notes (Signed)
UPT positive. Plans prenatal care at [facility]. To clerk for preadmit. 

## 2023-04-16 ENCOUNTER — Other Ambulatory Visit: Payer: Self-pay

## 2023-04-16 ENCOUNTER — Emergency Department
Admission: EM | Admit: 2023-04-16 | Discharge: 2023-04-16 | Disposition: A | Discharge status: Home / Self Care | Payer: Medicaid Other | Attending: Emergency Medicine | Admitting: Emergency Medicine

## 2023-04-16 DIAGNOSIS — J45909 Unspecified asthma, uncomplicated: Secondary | ICD-10-CM | POA: Insufficient documentation

## 2023-04-16 DIAGNOSIS — R1012 Left upper quadrant pain: Secondary | ICD-10-CM | POA: Diagnosis not present

## 2023-04-16 DIAGNOSIS — O219 Vomiting of pregnancy, unspecified: Secondary | ICD-10-CM | POA: Diagnosis not present

## 2023-04-16 DIAGNOSIS — R197 Diarrhea, unspecified: Secondary | ICD-10-CM | POA: Diagnosis not present

## 2023-04-16 DIAGNOSIS — Z3A09 9 weeks gestation of pregnancy: Secondary | ICD-10-CM | POA: Insufficient documentation

## 2023-04-16 DIAGNOSIS — O99511 Diseases of the respiratory system complicating pregnancy, first trimester: Secondary | ICD-10-CM | POA: Diagnosis not present

## 2023-04-16 DIAGNOSIS — Z3491 Encounter for supervision of normal pregnancy, unspecified, first trimester: Secondary | ICD-10-CM

## 2023-04-16 LAB — CBC
HCT: 38.9 % (ref 36.0–46.0)
Hemoglobin: 13.2 g/dL (ref 12.0–15.0)
MCH: 30.6 pg (ref 26.0–34.0)
MCHC: 33.9 g/dL (ref 30.0–36.0)
MCV: 90 fL (ref 80.0–100.0)
Platelets: 310 10*3/uL (ref 150–400)
RBC: 4.32 MIL/uL (ref 3.87–5.11)
RDW: 12.5 % (ref 11.5–15.5)
WBC: 8.4 10*3/uL (ref 4.0–10.5)
nRBC: 0 % (ref 0.0–0.2)

## 2023-04-16 LAB — HEPATIC FUNCTION PANEL
ALT: 11 U/L (ref 0–44)
AST: 16 U/L (ref 15–41)
Albumin: 4.5 g/dL (ref 3.5–5.0)
Alkaline Phosphatase: 52 U/L (ref 38–126)
Bilirubin, Direct: <0.1 mg/dL (ref 0.0–0.2)
Total Bilirubin: 0.7 mg/dL (ref 0.3–1.2)
Total Protein: 8.1 g/dL (ref 6.5–8.1)

## 2023-04-16 LAB — BASIC METABOLIC PANEL
Anion gap: 7 (ref 5–15)
BUN: 8 mg/dL (ref 6–20)
CO2: 24 mmol/L (ref 22–32)
Calcium: 9.6 mg/dL (ref 8.9–10.3)
Chloride: 102 mmol/L (ref 98–111)
Creatinine, Ser: 0.5 mg/dL (ref 0.44–1.00)
GFR, Estimated: >60 mL/min (ref >60)
Glucose, Bld: 95 mg/dL (ref 70–99)
Potassium: 3.9 mmol/L (ref 3.5–5.1)
Sodium: 133 mmol/L — ABNORMAL LOW (ref 135–145)

## 2023-04-16 LAB — HCG, QUANTITATIVE, PREGNANCY: hCG, Beta Chain, Quant, S: 155705 m[IU]/mL — ABNORMAL HIGH (ref <5)

## 2023-04-16 LAB — LIPASE, BLOOD: Lipase: 29 U/L (ref 11–51)

## 2023-04-16 MED ORDER — METOCLOPRAMIDE HCL 10 MG PO TABS: 10.0000 mg | ORAL_TABLET | Freq: Four times a day (QID) | ORAL | 0 refills | Status: DC | PRN

## 2023-04-16 MED ORDER — ONDANSETRON 4 MG PO TBDP: 4.0000 mg | ORAL_TABLET | Freq: Three times a day (TID) | ORAL | 0 refills | Status: DC | PRN

## 2023-04-16 MED ORDER — ONDANSETRON 8 MG PO TBDP
8.0000 mg | ORAL_TABLET | Freq: Once | ORAL | Status: AC
Start: 2023-04-16 — End: 2023-04-16
  Administered 2023-04-16: 8 mg via ORAL
  Filled 2023-04-16: qty 1

## 2023-04-16 MED ORDER — METOCLOPRAMIDE HCL 10 MG PO TABS
10.0000 mg | ORAL_TABLET | Freq: Once | ORAL | Status: AC
  Administered 2023-04-16: 10 mg via ORAL
  Filled 2023-04-16: qty 1

## 2023-04-16 NOTE — ED Triage Notes (Signed)
Pt to ED for diarrhea and nausea started today. [redacted] weeks pregnant. Also reports abdominal cramping. Denies vaginal bleeding.

## 2023-04-16 NOTE — ED Provider Notes (Signed)
[facility] Provider Note    Event Date/Time   First MD Initiated Contact with Patient 04/16/23 1826     (approximate)   History   Chief Complaint: Nausea   HPI  Nancy Ball is a 27 y.o. female  w/ pmh asthma, currently [redacted] weeks pregnant who comes to ED c/o n/v/d that started thsi morning. Prior to today, pt was in [facility].  No vaginal bleeding/discharge or dysuria/f/u. Does have some gen. Abd cramping pain. No f/c/sweats.        Physical Exam   Triage Vital Signs: ED Triage Vitals  Enc Vitals Group     BP 04/16/23 1752 121/84     Pulse Rate 04/16/23 1752 68     Resp 04/16/23 1752 19     Temp 04/16/23 1752 (!) 97.5 F (36.4 C)     Temp Source 04/16/23 1752 Oral     SpO2 04/16/23 1752 98 %     Weight 04/16/23 1755 132 lb 4.4 oz (60 kg)     Height --      Head Circumference --      Peak Flow --      Pain Score 04/16/23 1755 7     Pain Loc --      Pain Edu? --      Excl. in GC? --     Most recent vital signs: Vitals:   04/16/23 1752  BP: 121/84  Pulse: 68  Resp: 19  Temp: (!) 97.5 F (36.4 C)  SpO2: 98%    General: Awake, no distress.  CV:  Good peripheral perfusion. RRR Resp:  Normal effort.  Abd:  No distention. Soft, mild LUQ tenderness Other:  Moist oral mucosa   ED Results / Procedures / Treatments   Labs (all labs ordered are listed, but only abnormal results are displayed) Labs Reviewed  BASIC METABOLIC PANEL - Abnormal; Notable for the following components:      Result Value   Sodium 133 (*)    All other components within normal limits  CBC  HCG, QUANTITATIVE, PREGNANCY  HEPATIC FUNCTION PANEL  LIPASE, BLOOD     EKG    RADIOLOGY    PROCEDURES:  Procedures   MEDICATIONS ORDERED IN ED: Medications  ondansetron (ZOFRAN-ODT) disintegrating tablet 8 mg (8 mg Oral Given 04/16/23 1845)  metoCLOPramide (REGLAN) tablet 10 mg (10 mg Oral Given 04/16/23 1845)     IMPRESSION / MDM / ASSESSMENT AND PLAN /  ED COURSE  I reviewed the triage vital signs and the nursing notes.  DDx: Dehydration, viral illness, foodborne illness, AKI, electrolyte abnormality  Patient's presentation is most consistent with acute presentation with potential threat to life or bodily function.  Patient presents with nausea vomiting diarrhea.  Vital signs are normal, she is nontoxic, calm and comfortable.  Exam is benign.  After Reglan and Zofran, patient is feeling much better and tolerating oral intake.  Will continue supportive care at home.       FINAL CLINICAL IMPRESSION(S) / ED DIAGNOSES   Final diagnoses:  Nausea vomiting and diarrhea  First trimester pregnancy     Rx / DC Orders   ED Discharge Orders          Ordered    metoCLOPramide (REGLAN) 10 MG tablet  Every 6 hours PRN        04/16/23 1909    ondansetron (ZOFRAN-ODT) 4 MG disintegrating tablet  Every 8 hours PRN        04/16/23  1909             Note:  This document was prepared using Dragon voice recognition software and may include unintentional dictation errors.   Sharman Cheek, MD 04/16/23 1911

## 2023-04-30 ENCOUNTER — Ambulatory Visit: Payer: Medicaid Other | Admitting: Physician Assistant

## 2023-04-30 ENCOUNTER — Encounter: Payer: Self-pay | Admitting: Physician Assistant

## 2023-04-30 VITALS — BP 106/74 | HR 68 | Temp 97.1°F | Wt 130.8 lb

## 2023-04-30 DIAGNOSIS — J4520 Mild intermittent asthma, uncomplicated: Secondary | ICD-10-CM | POA: Diagnosis not present

## 2023-04-30 DIAGNOSIS — O0991 Supervision of high risk pregnancy, unspecified, first trimester: Secondary | ICD-10-CM

## 2023-04-30 DIAGNOSIS — Z3A12 12 weeks gestation of pregnancy: Secondary | ICD-10-CM

## 2023-04-30 DIAGNOSIS — O0990 Supervision of high risk pregnancy, unspecified, unspecified trimester: Secondary | ICD-10-CM | POA: Insufficient documentation

## 2023-04-30 DIAGNOSIS — Z720 Tobacco use: Secondary | ICD-10-CM

## 2023-04-30 DIAGNOSIS — Z23 Encounter for immunization: Secondary | ICD-10-CM | POA: Diagnosis not present

## 2023-04-30 DIAGNOSIS — Z8759 Personal history of other complications of pregnancy, childbirth and the puerperium: Secondary | ICD-10-CM

## 2023-04-30 DIAGNOSIS — Z8659 Personal history of other mental and behavioral disorders: Secondary | ICD-10-CM

## 2023-04-30 HISTORY — DX: Tobacco use: Z72.0

## 2023-04-30 LAB — HEMOGLOBIN, FINGERSTICK: Hemoglobin: 12.2 g/dL (ref 11.1–15.9)

## 2023-04-30 MED ORDER — ASPIRIN 81 MG PO TBEC
81.0000 mg | DELAYED_RELEASE_TABLET | Freq: Every day | ORAL | Status: AC
Start: 2023-04-30 — End: 2023-08-13

## 2023-04-30 MED ORDER — ALBUTEROL SULFATE HFA 108 (90 BASE) MCG/ACT IN AERS
2.0000 | INHALATION_SPRAY | Freq: Four times a day (QID) | RESPIRATORY_TRACT | 2 refills | Status: DC | PRN
Start: 2023-04-30 — End: 2023-11-26

## 2023-04-30 MED ORDER — PROMETHAZINE HCL 25 MG PO TABS
25.0000 mg | ORAL_TABLET | Freq: Four times a day (QID) | ORAL | 2 refills | Status: DC | PRN
Start: 2023-04-30 — End: 2023-10-22

## 2023-04-30 NOTE — Progress Notes (Signed)
[facility]  M[facility] Health Clinic   INITIAL PRENATAL VISIT NOTE  Subjective:  Nancy Ball is a 27 y.o. [id number] at [redacted]w[redacted]d being seen today to start prenatal care at [facility].  She is currently monitored for the following issues for this high-risk pregnancy and has History of pre-eclampsia; Asthma; History of depression; Pregnancy; Supervision of high-risk pregnancy, unspecified trimester; and Tobacco abuse on their problem list.  Patient reports nausea.  Contractions: Not present. Vag. Bleeding: None.  Movement: Absent. Denies leaking of fluid.   Indications for ASA therapy (per uptodate) One of the following: Previous pregnancy with preeclampsia, especially early onset and with an adverse outcome Yes Multifetal gestation No Chronic hypertension No Type 1 or 2 diabetes mellitus No Chronic kidney disease No Autoimmune disease (antiphospholipid syndrome, systemic lupus erythematosus) No  Two or more of the following: Nulliparity No Obesity (body mass index >30 kg/m2) No Family history of preeclampsia in mother or sister No Age =35 years No Sociodemographic characteristics (African American race, low socioeconomic level) Yes Personal risk factors (eg, previous pregnancy with low birth weight or small for gestational age infant, previous adverse pregnancy outcome [eg, stillbirth], interval >10 years between pregnancies) No   The following portions of the patient's history were reviewed and updated as appropriate: allergies, current medications, past family history, past medical history, past social history, past surgical history and problem list. Problem list updated.  Objective:   Vitals:   04/30/23 0946  BP: 106/74  Pulse: 68  Temp: (!) 97.1 F (36.2 C)  Weight: 130 lb 12.8 oz (59.3 kg)    Fetal Status: Fetal Heart Rate (bpm): 148 Fundal Height: 12 cm Movement: Absent      Physical Exam Vitals and nursing note reviewed.   Constitutional:      General: She is not in acute distress.    Appearance: Normal appearance. She is well-developed.  HENT:     Head: Normocephalic and atraumatic.     Right Ear: External ear normal.     Left Ear: External ear normal.     Nose: Nose normal. No congestion or rhinorrhea.     Mouth/Throat:     Lips: Pink.     Mouth: Mucous membranes are moist.     Dentition: Normal dentition. No dental caries.     Pharynx: Oropharynx is clear. Uvula midline.     Comments: Dentition: no obvious decay, no missing teeth Eyes:     General: No scleral icterus.    Conjunctiva/sclera: Conjunctivae normal.  Neck:     Thyroid: No thyroid mass or thyromegaly.  Cardiovascular:     Rate and Rhythm: Normal rate and regular rhythm.     Pulses: Normal pulses.     Heart sounds: Normal heart sounds.     Comments: Extremities are warm and well perfused Pulmonary:     Effort: Pulmonary effort is normal.     Breath sounds: Normal breath sounds.  Chest:     Chest wall: No mass.  Breasts:    Tanner Score is 5.     Breasts are symmetrical.     Right: Normal. No mass, nipple discharge or skin change.     Left: Normal. No mass, nipple discharge or skin change.     Comments: Bilat breast tenderness Abdominal:     General: Abdomen is flat.     Palpations: Abdomen is soft.     Tenderness: There is no abdominal tenderness.  Genitourinary:    General: Normal  vulva.     Exam position: Lithotomy position.     Pubic Area: No rash.      Labia:        Right: No rash.        Left: No rash.      Vagina: Normal. No vaginal discharge.     Cervix: No cervical motion tenderness or friability.     Uterus: Normal. Enlarged (Gravid 12-14 wk size). Not tender.      Adnexa: Right adnexa normal and left adnexa normal.     Rectum: Normal. No external hemorrhoid.  Musculoskeletal:     Right lower leg: No edema.     Left lower leg: No edema.  Lymphadenopathy:     Cervical: No cervical adenopathy.     Upper  Body:     Right upper body: No axillary adenopathy.     Left upper body: No axillary adenopathy.  Skin:    General: Skin is warm.     Capillary Refill: Capillary refill takes less than 2 seconds.  Neurological:     Mental Status: She is alert and oriented to person, place, and time.  Psychiatric:        Mood and Affect: Mood normal.        Behavior: Behavior normal.        Judgment: Judgment normal.    Assessment and Plan:  Pregnancy: [id number] at [redacted]w[redacted]d   1. Supervision of high-risk pregnancy, unspecified trimester Schedule dating [place] due to unsure LMP. Desires genetic screen, declines genetic counseling referral. Has daily nausea, eating OK, but some days vomiting is troublesome and requests medication. eRx promethazine, and written info for N/V comfort measures given. - Prenatal Profile I - MaterniT 21 plus Core, Blood - Fe+CBC/D/Plt+TIBC+Fer+Retic - Hemoglobin, fingerstick - US OB Comp Less 14 Wks; Future - promethazine (PHENERGAN) 25 MG tablet; Take 1 tablet (25 mg total) by mouth every 6 (six) hours as needed for nausea or vomiting.  Dispense: 30 tablet; Refill: 2  2. [redacted] weeks gestation of pregnancy RV in 4 weeks, anticipate fetal anat [place] at 19-20 wk.  3. History of pre-eclampsia Pt clearly states no allergy to aspirin, just combo acetaminophen/caffeine/aspirin in Excedrine migraine product. Start low-dose daily aspirin and continue til delivery, written info given. Montior for pre-ecampsia sx. Order baseline PIH panel. - aspirin EC 81 MG tablet; Take 1 tablet (81 mg total) by mouth daily. Swallow whole.  4. History of depression Pt states h/o pp depression. EPDS score = 2 today. Monitor sx.  5. Mild intermittent asthma without complication No current/recent sx. 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 Does not have albuterol MDI, but has used one in the past. eRx for prn use. Check baseline peak flow today. - albuterol (VENTOLIN HFA) 108 (90 Base) MCG/ACT  inhaler; Inhale 2 puffs into the lungs every 6 (six) hours as needed for wheezing or shortness of breath.  Dispense: 8 g; Refill: 2  6. Tobacco abuse Has decreased cigarette smoking from 1ppd to 2 cpd since found out about pregnancy, was able to quit completely with prior pregnancy. Counseled cessation via 5 As with 7 min time, [place] Quitline info given.   Discussed overview of care and coordination with inpatient delivery practices including  [facility],  [facility].    Preterm labor symptoms and general obstetric precautions including but not limited to vaginal bleeding, contractions, leaking of fluid and fetal movement were reviewed in detail with the patient.  Please refer to  After Visit Summary for other counseling recommendations.   Return in about 4 weeks (around 05/28/2023) for Routine prenatal care.  Future Appointments  Date Time Provider Department Center  05/07/2023  2:30 PM [facility]    Landry Dyke, [place]

## 2023-04-30 NOTE — Progress Notes (Addendum)
Presents for initiation of prenatal care. [facility] ED evaluation for back pain. UPT negative. Treated for acute cystitis with hematuria and reports antibiotic completed. No medical care since 01/2023. Jossie Ng, RN Hgb = 12.2 and no intervention required per standing order. Jossie Ng, RN Client given appt reminder for 05/07/23 [place] with arrival time of 1400. Counseled to present for [place] with a full bladder. [facility] [employer] given with [employer] highlighted. Jossie Ng, RN Peak Flow results = 1) 1400, 2) 380, 3) 400 with good effort. Jossie Ng, RN

## 2023-05-04 LAB — MATERNIT 21 PLUS CORE, BLOOD
Fetal Fraction: 7
Result (T21): NEGATIVE
Trisomy 13 (Patau syndrome): NEGATIVE
Trisomy 18 (Edwards syndrome): NEGATIVE
Trisomy 21 (Down syndrome): NEGATIVE

## 2023-05-04 LAB — PREGNANCY, INITIAL SCREEN
Antibody Screen: NEGATIVE
Basophils Absolute: 0 10*3/uL (ref 0.0–0.2)
Basos: 1 %
Bilirubin, UA: NEGATIVE
Chlamydia trachomatis, NAA: NEGATIVE
EOS (ABSOLUTE): 0.1 10*3/uL (ref 0.0–0.4)
Eos: 1 %
Glucose, UA: NEGATIVE
HCV Ab: NONREACTIVE
HIV Screen 4th Generation wRfx: NONREACTIVE
Hematocrit: 35.5 % (ref 34.0–46.6)
Hemoglobin: 11.9 g/dL (ref 11.1–15.9)
Hepatitis B Surface Ag: NEGATIVE
Immature Grans (Abs): 0 10*3/uL (ref 0.0–0.1)
Immature Granulocytes: 0 %
Lymphocytes Absolute: 2.2 10*3/uL (ref 0.7–3.1)
Lymphs: 37 %
MCH: 30 pg (ref 26.6–33.0)
MCHC: 33.5 g/dL (ref 31.5–35.7)
MCV: 89 fL (ref 79–97)
Monocytes Absolute: 0.6 10*3/uL (ref 0.1–0.9)
Monocytes: 10 %
Neisseria Gonorrhoeae by PCR: NEGATIVE
Neutrophils Absolute: 3 10*3/uL (ref 1.4–7.0)
Neutrophils: 51 %
Nitrite, UA: NEGATIVE
Platelets: 313 10*3/uL (ref 150–450)
RBC, UA: NEGATIVE
RBC: 3.97 x10E6/uL (ref 3.77–5.28)
RDW: 12.7 % (ref 11.7–15.4)
RPR Ser Ql: REACTIVE — AB
Rh Factor: POSITIVE
Rubella Antibodies, IGG: 13.7 index (ref >0.99)
Specific Gravity, UA: 1.028 (ref 1.005–1.030)
Urobilinogen, Ur: 0.2 mg/dL (ref 0.2–1.0)
WBC: 5.9 10*3/uL (ref 3.4–10.8)
pH, UA: 6 (ref 5.0–7.5)

## 2023-05-04 LAB — MICROSCOPIC EXAMINATION
Casts: NONE SEEN /lpf
Epithelial Cells (non renal): >10 /hpf — AB (ref 0–10)

## 2023-05-04 LAB — URINE CULTURE, OB REFLEX

## 2023-05-04 LAB — RPR, QUANT+TP ABS (REFLEX)
Rapid Plasma Reagin, Quant: 1:8 {titer} — ABNORMAL HIGH
T Pallidum Abs: REACTIVE — AB

## 2023-05-04 LAB — HCV INTERPRETATION

## 2023-05-06 ENCOUNTER — Emergency Department: Payer: Medicaid Other

## 2023-05-06 ENCOUNTER — Encounter: Payer: Self-pay | Admitting: Emergency Medicine

## 2023-05-06 ENCOUNTER — Other Ambulatory Visit: Payer: Self-pay

## 2023-05-06 ENCOUNTER — Emergency Department
Admission: EM | Admit: 2023-05-06 | Discharge: 2023-05-06 | Disposition: A | Discharge status: Home / Self Care | Payer: Medicaid Other | Attending: Emergency Medicine | Admitting: Emergency Medicine

## 2023-05-06 DIAGNOSIS — O4690 Antepartum hemorrhage, unspecified, unspecified trimester: Secondary | ICD-10-CM

## 2023-05-06 DIAGNOSIS — O209 Hemorrhage in early pregnancy, unspecified: Secondary | ICD-10-CM | POA: Insufficient documentation

## 2023-05-06 DIAGNOSIS — R109 Unspecified abdominal pain: Secondary | ICD-10-CM | POA: Insufficient documentation

## 2023-05-06 DIAGNOSIS — Z3A12 12 weeks gestation of pregnancy: Secondary | ICD-10-CM | POA: Insufficient documentation

## 2023-05-06 LAB — URINALYSIS, ROUTINE W REFLEX MICROSCOPIC
Bilirubin Urine: NEGATIVE
Glucose, UA: NEGATIVE mg/dL
Hgb urine dipstick: NEGATIVE
Ketones, ur: NEGATIVE mg/dL
Nitrite: NEGATIVE
Protein, ur: NEGATIVE mg/dL
Specific Gravity, Urine: 1.02 (ref 1.005–1.030)
pH: 5 (ref 5.0–8.0)

## 2023-05-06 LAB — BASIC METABOLIC PANEL
Anion gap: 9 (ref 5–15)
BUN: 9 mg/dL (ref 6–20)
CO2: 19 mmol/L — ABNORMAL LOW (ref 22–32)
Calcium: 8.9 mg/dL (ref 8.9–10.3)
Chloride: 107 mmol/L (ref 98–111)
Creatinine, Ser: 0.48 mg/dL (ref 0.44–1.00)
GFR, Estimated: >60 mL/min (ref >60)
Glucose, Bld: 82 mg/dL (ref 70–99)
Potassium: 3.2 mmol/L — ABNORMAL LOW (ref 3.5–5.1)
Sodium: 135 mmol/L (ref 135–145)

## 2023-05-06 LAB — HCG, QUANTITATIVE, PREGNANCY: hCG, Beta Chain, Quant, S: 114332 m[IU]/mL — ABNORMAL HIGH (ref <5)

## 2023-05-06 LAB — CBC
HCT: 30.9 % — ABNORMAL LOW (ref 36.0–46.0)
Hemoglobin: 10.9 g/dL — ABNORMAL LOW (ref 12.0–15.0)
MCH: 30.7 pg (ref 26.0–34.0)
MCHC: 35.3 g/dL (ref 30.0–36.0)
MCV: 87 fL (ref 80.0–100.0)
Platelets: 268 10*3/uL (ref 150–400)
RBC: 3.55 MIL/uL — ABNORMAL LOW (ref 3.87–5.11)
RDW: 12.4 % (ref 11.5–15.5)
WBC: 5.5 10*3/uL (ref 4.0–10.5)
nRBC: 0 % (ref 0.0–0.2)

## 2023-05-06 LAB — ABO/RH: ABO/RH(D): B POS

## 2023-05-06 NOTE — ED Notes (Signed)
Pink, purple, and light green tubes sent to lab at this time

## 2023-05-06 NOTE — ED Notes (Signed)
US at bedside

## 2023-05-06 NOTE — ED Triage Notes (Signed)
Patient to ED via POV for vaginal bleeding that started PTA- bright red in color. Only noticed when wiping when using the bathroom. Currently [redacted] weeks pregnant.

## 2023-05-06 NOTE — ED Provider Notes (Signed)
[facility] Provider Note  Patient Contact: 8:24 PM (approximate)   History   Vaginal Bleeding   HPI  Nancy Ball is a 27 y.o. female who presents emerged department for vaginal bleeding in pregnancy.  Patient states that she had some abdominal cramps this morning when she woke up.  She has had a small amount of blood when wiping.  Patient denies any dysuria, polyuria, hematuria.  No emesis.  No diarrhea or constipation.     Physical Exam   Triage Vital Signs: ED Triage Vitals  Enc Vitals Group     BP 05/06/23 1743 113/82     Pulse Rate 05/06/23 1743 83     Resp 05/06/23 1743 18     Temp 05/06/23 1743 98.4 F (36.9 C)     Temp Source 05/06/23 1743 Oral     SpO2 05/06/23 1743 98 %     Weight 05/06/23 1744 132 lb (59.9 kg)     Height 05/06/23 1744 4\' 11"  (1.499 m)     Head Circumference --      Peak Flow --      Pain Score 05/06/23 1744 7     Pain Loc --      Pain Edu? --      Excl. in GC? --     Most recent vital signs: Vitals:   05/06/23 1743 05/06/23 2105  BP: 113/82 111/73  Pulse: 83 75  Resp: 18 18  Temp: 98.4 F (36.9 C) 97.6 F (36.4 C)  SpO2: 98% 100%     General: Alert and in no acute distress.  Cardiovascular:  Good peripheral perfusion Respiratory: Normal respiratory effort without tachypnea or retractions. Lungs CTAB.  Gastrointestinal: Bowel sounds 4 quadrants. Soft and nontender to palpation. No guarding or rigidity. No palpable masses. No distention. No CVA tenderness. Musculoskeletal: Full range of motion to all extremities.  Neurologic:  No gross focal neurologic deficits are appreciated.  Skin:   No rash noted Other:   ED Results / Procedures / Treatments   Labs (all labs ordered are listed, but only abnormal results are displayed) Labs Reviewed  CBC - Abnormal; Notable for the following components:      Result Value   RBC 3.55 (*)    Hemoglobin 10.9 (*)    HCT 30.9 (*)    All other components within  normal limits  BASIC METABOLIC PANEL - Abnormal; Notable for the following components:   Potassium 3.2 (*)    CO2 19 (*)    All other components within normal limits  HCG, QUANTITATIVE, PREGNANCY - Abnormal; Notable for the following components:   hCG, Beta Chain, [place] Longest 161,096 (*)    All other components within normal limits  URINALYSIS, ROUTINE W REFLEX MICROSCOPIC - Abnormal; Notable for the following components:   Color, Urine YELLOW (*)    APPearance HAZY (*)    Leukocytes,Ua SMALL (*)    Bacteria, UA RARE (*)    All other components within normal limits  POC URINE PREG, ED  ABO/RH     EKG     RADIOLOGY  I personally viewed, evaluated, and interpreted these images as part of my medical decision making, as well as reviewing the written report by the radiologist.  ED Provider Interpretation: No acute findings on ultrasound.  Single viable intrauterine pregnancy.  US OB Comp Less 14 Wks  Result Date: 05/06/2023 CLINICAL DATA:  Vaginal bleeding EXAM: OBSTETRIC <14 WK ULTRASOUND TECHNIQUE: Transabdominal ultrasound was performed for  evaluation of the gestation as well as the maternal uterus and adnexal regions. COMPARISON:  None Available. FINDINGS: Intrauterine gestational sac: Single intrauterine gestational sac Yolk sac:  Not visualized Embryo:  Visualized Cardiac Activity: Visualized Heart Rate: 133 bpm CRL:   61.4 mm   12 w 4 d                  [place] EDC: 11/14/2023 Subchorionic hemorrhage:  None visualized. Maternal uterus/adnexae: Ovaries are within normal limits. Right ovary measures 3.3 x 1.6 by 2.9 cm. The left ovary measures 2.8 x 1.4 by 3 cm. No significant free fluid. IMPRESSION: Single viable intrauterine gestation as above. No specific abnormality is seen Electronically Signed   By: Jasmine Pang M.D.   On: 05/06/2023 21:01    PROCEDURES:  Critical Care performed: No  Procedures   MEDICATIONS ORDERED IN ED: Medications - No data to display   IMPRESSION /  MDM / ASSESSMENT AND PLAN / ED COURSE  I reviewed the triage vital signs and the nursing notes.                                 Differential diagnosis includes, but is not limited to, miscarriage, ectopic pregnancy, subchorionic hematoma, placenta previa  Patient's presentation is most consistent with acute presentation with potential threat to life or bodily function.   Patient's diagnosis is consistent with vaginal bleeding in pregnancy.  Patient presents emergency department with a mild amount of bleeding today.  Patient is roughly [redacted] weeks pregnant and had small amount of blood when wiping.  Patient has reassuring abdominal exam.  Ultrasound reveals single intrauterine pregnancy with no acute findings.  Patient measures approximately 12 weeks and 4 days by ultrasound.  At this time no new prescriptions.  Follow-up with OB/GYN..  Patient is given ED precautions to return to the ED for any worsening or new symptoms.     FINAL CLINICAL IMPRESSION(S) / ED DIAGNOSES   Final diagnoses:  Vaginal bleeding in pregnancy     Rx / DC Orders   ED Discharge Orders     None        Note:  This document was prepared using Dragon voice recognition software and may include unintentional dictation errors.   Lanette Hampshire 05/06/23 2232    Trinna Post, MD 05/09/23 [phone]

## 2023-05-07 ENCOUNTER — Encounter: Payer: Self-pay | Admitting: Physician Assistant

## 2023-05-07 ENCOUNTER — Ambulatory Visit
Admission: RE | Admit: 2023-05-07 | Discharge: 2023-05-07 | Disposition: A | Discharge status: Home / Self Care | Payer: Self-pay | Source: Ambulatory Visit | Attending: Physician Assistant | Admitting: Physician Assistant

## 2023-05-07 DIAGNOSIS — Z8619 Personal history of other infectious and parasitic diseases: Secondary | ICD-10-CM | POA: Insufficient documentation

## 2023-05-07 DIAGNOSIS — A539 Syphilis, unspecified: Secondary | ICD-10-CM | POA: Insufficient documentation

## 2023-05-07 DIAGNOSIS — O0990 Supervision of high risk pregnancy, unspecified, unspecified trimester: Secondary | ICD-10-CM

## 2023-05-27 NOTE — Addendum Note (Signed)
Addended by: Heywood Bene on: 05/27/2023 03:17 PM   Modules accepted: Orders

## 2023-05-28 ENCOUNTER — Other Ambulatory Visit: Payer: Self-pay | Admitting: Physician Assistant

## 2023-05-28 ENCOUNTER — Ambulatory Visit: Payer: Medicaid Other | Admitting: Physician Assistant

## 2023-05-28 ENCOUNTER — Encounter: Payer: Self-pay | Admitting: Physician Assistant

## 2023-05-28 ENCOUNTER — Telehealth: Payer: Self-pay

## 2023-05-28 VITALS — BP 98/60 | HR 71 | Temp 97.6°F | Wt 130.4 lb

## 2023-05-28 DIAGNOSIS — O0990 Supervision of high risk pregnancy, unspecified, unspecified trimester: Secondary | ICD-10-CM

## 2023-05-28 DIAGNOSIS — J45909 Unspecified asthma, uncomplicated: Secondary | ICD-10-CM

## 2023-05-28 DIAGNOSIS — O99519 Diseases of the respiratory system complicating pregnancy, unspecified trimester: Secondary | ICD-10-CM | POA: Insufficient documentation

## 2023-05-28 DIAGNOSIS — Z8619 Personal history of other infectious and parasitic diseases: Secondary | ICD-10-CM

## 2023-05-28 DIAGNOSIS — Z3A16 16 weeks gestation of pregnancy: Secondary | ICD-10-CM

## 2023-05-28 DIAGNOSIS — O0992 Supervision of high risk pregnancy, unspecified, second trimester: Secondary | ICD-10-CM

## 2023-05-28 DIAGNOSIS — O99512 Diseases of the respiratory system complicating pregnancy, second trimester: Secondary | ICD-10-CM

## 2023-05-28 NOTE — Progress Notes (Signed)
Desires AFP today. Jossie Ng, RN

## 2023-05-28 NOTE — Progress Notes (Signed)
ROI signed and faxed to [facility] Department for syphilis treatment records with fax confirmation received. AFP drawn today. Jossie Ng, RN

## 2023-05-28 NOTE — Telephone Encounter (Signed)
Return call from Jae Dire?) from [facility] Department in response to a ROI faxed this am requesting syphilis treatment records. Per female, last appt for client at [place] Department was in 2012 (positive for syphilis 05/2021 with testing at [facility] lab). Female states will shred ROI.  Justice Rocher RN, STD Coordinator at [facility] consulted regarding above and states will check in state system to try to find treatment info. Jossie Ng, RN

## 2023-05-28 NOTE — Progress Notes (Signed)
[facility] Health Department Maternal Health Clinic  PRENATAL VISIT NOTE  Subjective:  Nancy Ball is a 27 y.o. [phone] at [redacted]w[redacted]d being seen today with partner for ongoing prenatal care.  She is currently monitored for the following issues for this high-risk pregnancy and has History of pre-eclampsia; Asthma; History of depression; Supervision of high-risk pregnancy, unspecified trimester; Tobacco abuse; History of syphilis; and Asthma affecting pregnancy, antepartum on their problem list.  Patient reports  occ sharp bilat low pelvic discomfort .  Contractions: Not present. Vag. Bleeding: None.  Movement: Present. Denies leaking of fluid/ROM.   The following portions of the patient's history were reviewed and updated as appropriate: allergies, current medications, past family history, past medical history, past social history, past surgical history and problem list. Problem list updated.  Objective:   Vitals:   05/28/23 1038  BP: 98/60  Pulse: 71  Temp: 97.6 F (36.4 C)  Weight: 130 lb 6.4 oz (59.1 kg)    Fetal Status: Fetal Heart Rate (bpm): 132 Fundal Height: 16 cm Movement: Present     General:  Alert, oriented and cooperative. Patient is in no acute distress.  Skin: Skin is warm and dry. No rash noted.   Cardiovascular: Normal heart rate noted  Respiratory: Normal respiratory effort, no problems with respiration noted  Abdomen: Soft, gravid, appropriate for gestational age.  Pain/Pressure: Absent     Pelvic: Cervical exam deferred        Extremities: Normal range of motion.  Edema: None  Mental Status: Normal mood and affect. Normal behavior. Normal judgment and thought content.   Assessment and Plan:  Pregnancy: [id number] at [redacted]w[redacted]d  1. Supervision of high-risk pregnancy, unspecified trimester Desires fetal ONTD screen today, AFP only drawn. Enc po liquids. Reassurance that pelvic discomfort likely ligamentous in origin. Schedule fetal anatomy scan. - AFP, Serum, Open  Spina Bifida  2. [redacted] weeks gestation of pregnancy Next visit in 4 weeks.  3. Asthma affecting pregnancy, antepartum No recent sx. Did not pick up albuterol Rx yet - enc to do so.  4. History of syphilis ROI for prior testing/treatment records from [facility] Dept today.   Preterm labor symptoms and general obstetric precautions including but not limited to vaginal bleeding, contractions, leaking of fluid and fetal movement were reviewed in detail with the patient. Please refer to After Visit Summary for other counseling recommendations.  Return in about 4 weeks (around 06/25/2023) for Routine prenatal care.  Future Appointments  Date Time Provider Department Center  06/26/2023 10:30 AM AC-MH PROVIDER AC-MAT None    Landry Dyke, PA-C

## 2023-05-29 ENCOUNTER — Telehealth: Payer: Self-pay

## 2023-05-29 NOTE — Telephone Encounter (Signed)
Call to client with [facility] MFM [place] appt scheduled for 06/24/2023 with arrival time mof 0945. Client worte appt down and correctly repeated to RN. Client provided faciltiy location and states aware where appt will be.  Counseled received call from [facility] 05/28/23 stating last appt there scheduled in 2012. Per client, thinks that is where she got treated for syphilis in 2022. Client states will look through her papers to see if can find where got treated and bring to next maternity appt. Jossie Ng, RN

## 2023-05-30 LAB — AFP, SERUM, OPEN SPINA BIFIDA
AFP MoM: 0.75
AFP Value: 31.9 ng/mL
Gest. Age on Collection Date: 16.3 weeks
Maternal Age At EDD: 27.3 yr
OSBR Risk 1 IN: 10000
Test Results:: NEGATIVE
Weight: 130 [lb_av]

## 2023-06-24 ENCOUNTER — Ambulatory Visit: Payer: Medicaid Other

## 2023-06-26 ENCOUNTER — Telehealth: Payer: Self-pay

## 2023-06-26 ENCOUNTER — Ambulatory Visit: Payer: Medicaid Other

## 2023-06-26 NOTE — Telephone Encounter (Signed)
Left message for patient to re-scheule OB appointment due to no provider 06/26/2023. BTHIELE RN

## 2023-06-29 NOTE — Telephone Encounter (Signed)
Unable to leave message on patient's mobile/home phone number. Called alternative contact (patient's mother) and left message for Nancy Ball to call [facility] at [phone] so that we may schedule her next OB visit. BTHIELE RN

## 2023-07-01 ENCOUNTER — Telehealth: Payer: Self-pay

## 2023-07-01 NOTE — Telephone Encounter (Signed)
Return call from client and [facility] RV appt scheduled for 07/07/2023 with arrival time of 1300. Jossie Ng, RN

## 2023-07-01 NOTE — Telephone Encounter (Signed)
Unable to leave message on patient's phone. Called patient's mother's and spouse's phone number and left a voice message on each to call [phone] to schedule return visit. BTHIELE RN

## 2023-07-07 ENCOUNTER — Telehealth: Payer: Self-pay

## 2023-07-07 ENCOUNTER — Ambulatory Visit: Payer: Medicaid Other | Admitting: Advanced Practice Midwife

## 2023-07-07 VITALS — BP 98/62 | HR 78 | Temp 98.2°F | Wt 136.2 lb

## 2023-07-07 DIAGNOSIS — Z720 Tobacco use: Secondary | ICD-10-CM

## 2023-07-07 DIAGNOSIS — Z8659 Personal history of other mental and behavioral disorders: Secondary | ICD-10-CM

## 2023-07-07 DIAGNOSIS — J45909 Unspecified asthma, uncomplicated: Secondary | ICD-10-CM

## 2023-07-07 DIAGNOSIS — O99519 Diseases of the respiratory system complicating pregnancy, unspecified trimester: Secondary | ICD-10-CM

## 2023-07-07 DIAGNOSIS — O0992 Supervision of high risk pregnancy, unspecified, second trimester: Secondary | ICD-10-CM

## 2023-07-07 DIAGNOSIS — O0990 Supervision of high risk pregnancy, unspecified, unspecified trimester: Secondary | ICD-10-CM

## 2023-07-07 DIAGNOSIS — Z8759 Personal history of other complications of pregnancy, childbirth and the puerperium: Secondary | ICD-10-CM

## 2023-07-07 NOTE — Progress Notes (Signed)
[facility] Health Department Maternal Health Clinic  PRENATAL VISIT NOTE  Subjective:  Nancy Ball is a 27 y.o. [id number] at [redacted]w[redacted]d being seen today for ongoing prenatal care.  She is currently monitored for the following issues for this high-risk pregnancy and has History of pre-eclampsia; Asthma; History of depression; Supervision of high-risk pregnancy, unspecified trimester; Tobacco abuse  10-3 cpd; History of syphilis; and Asthma affecting pregnancy, antepartum on their problem list.  Patient reports no complaints.  Contractions: Not present. Vag. Bleeding: None.  Movement: Present. Denies leaking of fluid/ROM.   The following portions of the patient's history were reviewed and updated as appropriate: allergies, current medications, past family history, past medical history, past social history, past surgical history and problem list. Problem list updated.  Objective:   Vitals:   07/07/23 1329  BP: 98/62  Pulse: 78  Temp: 98.2 F (36.8 C)  Weight: 136 lb 3.2 oz (61.8 kg)    Fetal Status: Fetal Heart Rate (bpm): 160 Fundal Height: 22 cm Movement: Present     General:  Alert, oriented and cooperative. Patient is in no acute distress.  Skin: Skin is warm and dry. No rash noted.   Cardiovascular: Normal heart rate noted  Respiratory: Normal respiratory effort, no problems with respiration noted  Abdomen: Soft, gravid, appropriate for gestational age.  Pain/Pressure: Absent     Pelvic: Cervical exam deferred        Extremities: Normal range of motion.     Mental Status: Normal mood and affect. Normal behavior. Normal judgment and thought content.   Assessment and Plan:  Pregnancy: [id number] at [redacted]w[redacted]d  1. Supervision of high-risk pregnancy, unspecified trimester Not working [facility] apts here 11 lb 3.2 oz (5.08 kg) 6 lb wt gain in last 6 wks Not taking ASA 81 mg daily Not exercising; encouraged going to [facility] 2x/wk AFP only neg 05/28/23 NIPS neg 04/30/23 Pap  neg 07/15/21 Wants BTL DNKA anatomy MFM u/s on 06/24/23--RN to reschedule FOB present with uncontrolled asthma and smoker, went to ER last night  2. History of pre-eclampsia 98/62 today  3. Tobacco abuse Smoking 3-10 cpd; counseled to stop asap  4. History of depression States no motivation to do anything; accepts contact card for [name], LCSW and agrees to call for apt  5. Asthma affecting pregnancy, antepartum Denies wheezing, SOB, nocturnal coughing Using Albuterol prn; last use yesterday triggered by heat   Preterm labor symptoms and general obstetric precautions including but not limited to vaginal bleeding, contractions, leaking of fluid and fetal movement were reviewed in detail with the patient. Please refer to After Visit Summary for other counseling recommendations.  Return in about 4 weeks (around 08/04/2023) for routine PNC.  No future appointments.  Alberteen Spindle, CNM

## 2023-07-07 NOTE — Telephone Encounter (Signed)
TC to patient to inform of rescheduled U/S at [facility] MFM on 07/29/23. Patient to arrive at 7:45 a.m. in [place] [place] [facility]. LM with number to call.Marland KitchenMarland KitchenBurt Knack, RN

## 2023-07-07 NOTE — Progress Notes (Signed)
Patient here for MH RV at 22 1/7. S/S PTL reviewed with client today and literature given. Patient states she had D&C at [facility] in 2022 and then was treated for Syphilis at [facility] Department for 3 set of shots. TC to reschedule [facility].Marland KitchenMarland KitchenBurt Knack, RN

## 2023-07-08 ENCOUNTER — Telehealth: Payer: Self-pay

## 2023-07-08 NOTE — Telephone Encounter (Signed)
Received phone call from Raynald Kemp at [facility] Department informing this RN that this patient's account is inactive due to not being seen there since 2012. States there is no documentation of this patient receiving Syphilis treatment from their facility. States she may have received treatment from [facility] located on [address] in [place] or [facility] [employer] located across from [employer] located in [place]. States maybe explaining local landmarks associated with [facility] [employer] may assist in patient recalling where she received Syphilis treatment. Will inform MH coordinator K. Brewer-Jensen, RN of above. Tawny Hopping, RN

## 2023-07-09 NOTE — Telephone Encounter (Signed)
Per appt desk, client has [facility] RV appt 08/14/23. Jossie Ng, RN

## 2023-07-27 ENCOUNTER — Other Ambulatory Visit: Payer: Medicaid Other

## 2023-07-29 ENCOUNTER — Ambulatory Visit: Payer: Medicaid Other | Attending: Physician Assistant

## 2023-08-04 ENCOUNTER — Telehealth: Payer: Self-pay

## 2023-08-04 ENCOUNTER — Ambulatory Visit: Payer: Medicaid Other | Admitting: Advanced Practice Midwife

## 2023-08-04 VITALS — BP 101/65 | HR 64 | Temp 97.5°F | Wt 145.6 lb

## 2023-08-04 DIAGNOSIS — O0992 Supervision of high risk pregnancy, unspecified, second trimester: Secondary | ICD-10-CM

## 2023-08-04 DIAGNOSIS — O0990 Supervision of high risk pregnancy, unspecified, unspecified trimester: Secondary | ICD-10-CM

## 2023-08-04 DIAGNOSIS — Z8759 Personal history of other complications of pregnancy, childbirth and the puerperium: Secondary | ICD-10-CM

## 2023-08-04 DIAGNOSIS — Z8659 Personal history of other mental and behavioral disorders: Secondary | ICD-10-CM

## 2023-08-04 DIAGNOSIS — Z720 Tobacco use: Secondary | ICD-10-CM

## 2023-08-04 NOTE — Progress Notes (Signed)
BTL consent signed and copy given to client (considering BTL). Labeled copy sent for scanning and original in BTL accodion file in [facility] Nurse Workroom. [facility] for 07/29/23 [facility] [place] appt. Jossie Ng, RN Case Management on call list not available in [facility] and client desires to speak with someone regarding assistance with baby items. Left message to call on voicemail of Gayland Curry RN and then call made to Esmeralda Links who states will be to clinic to see client shortly. Hazle Coca CNM notified Ms. Hyman Hopes to see client prior to her leaving clinic. Jossie Ng, RN Ms. Webb in room to see client. Client desires 07/29/23 MFM [place] be rescheduled. Call to scheduler and appt rescheduled for 08/19/23 with arrival time of 1330. Client and partner each given an appt reminder card which includes facility address. Jossie Ng, RN

## 2023-08-04 NOTE — Telephone Encounter (Signed)
Call to [facility] Department and [place] location selected from phone tree. Female answering phone transferred RN call to the Nurse Line. Left message stating trying to find out if / when client would have been treated at facility for syphilis. Left message client thought was at [facility] Department, but incorrect. Also left message that no treatment records available at the state level. [facility] number to call provided. Jossie Ng, RN

## 2023-08-04 NOTE — Progress Notes (Signed)
[facility] Health Department Maternal Health Clinic  PRENATAL VISIT NOTE  Subjective:  Nancy Ball is a 27 y.o. [phone] at [redacted]w[redacted]d being seen today for ongoing prenatal care.  She is currently monitored for the following issues for this high-risk pregnancy and has History of pre-eclampsia; History of depression; Supervision of high-risk pregnancy, unspecified trimester; Tobacco abuse  10-3 cpd; History of syphilis; and Asthma affecting pregnancy, antepartum on their problem list.  Patient reports no complaints.  Contractions: Not present. Vag. Bleeding: None.  Movement: Present. Denies leaking of fluid/ROM.   The following portions of the patient's history were reviewed and updated as appropriate: allergies, current medications, past family history, past medical history, past social history, past surgical history and problem list. Problem list updated.  Objective:   Vitals:   08/04/23 1549  BP: 101/65  Pulse: 64  Temp: (!) 97.5 F (36.4 C)  Weight: 145 lb 9.6 oz (66 kg)    Fetal Status: Fetal Heart Rate (bpm): 130 Fundal Height: 27 cm Movement: Present     General:  Alert, oriented and cooperative. Patient is in no acute distress.  Skin: Skin is warm and dry. No rash noted.   Cardiovascular: Normal heart rate noted  Respiratory: Normal respiratory effort, no problems with respiration noted  Abdomen: Soft, gravid, appropriate for gestational age.  Pain/Pressure: Absent     Pelvic: Cervical exam deferred        Extremities: Normal range of motion.  Edema: None  Mental Status: Normal mood and affect. Normal behavior. Normal judgment and thought content.   Assessment and Plan:  Pregnancy: [id number] at [redacted]w[redacted]d  1. Supervision of high-risk pregnancy, unspecified trimester Here with FOB Nancy Kendall, RN here to talk to pt about assisting with baby items Not working Not taking ASA 20 lb 9.6 oz (9.344 kg) 9 lb wt gain in last 4 wks Walks with son and plays with dog 3x/wk x 15  min DNKA anatomy u/s x 2 on 06/24/23 and 07/29/23--pt states "too busy" with her mom in [place] with Alzheimers; agrees to go if pm apt--RN to schedule 08/19/23 Stopped smoking cigs 07/16/23! Has car seat and crib Received 07/08/23 t/c note from [place] at [facility] Dept informing that this pt's account is inactive due to not being seen there since 2012. States there is no documentation of this pt receiving syphilis tx from their facility. States may have received tx from [facility] or [facility] in [place].    2. Tobacco abuse  10-3 cpd Stopped smoking cigs 07/16/23  3. History of depression Has not made apt with Nancy Ball "too busy" but feels more comfortable talking to her primary care MD at [facility]  4. History of pre-eclampsia 101/65 today    Preterm labor symptoms and general obstetric precautions including but not limited to vaginal bleeding, contractions, leaking of fluid and fetal movement were reviewed in detail with the patient. Please refer to After Visit Summary for other counseling recommendations.  Return in about 2 weeks (around 08/18/2023) for 28 week labs, routine PNC.  Future Appointments  Date Time Provider Department Center  08/18/2023 10:30 AM AC-MH PROVIDER AC-MAT None  08/19/2023  1:30 PM [facility] NURSE [facility]  08/19/2023  1:45 PM [facility] US5 [facility]    Nancy Ball, CNM

## 2023-08-05 ENCOUNTER — Telehealth: Payer: Self-pay | Admitting: Licensed Clinical Social Worker

## 2023-08-05 NOTE — Telephone Encounter (Signed)
-----   Message from Nurse Merian Capron sent at 08/05/2023 10:30 AM EDT ----- Regarding: Referral Good Morning [name], Please reach out to this patient to see if she would be interested in counseling services with you. She has a history of depression and also a history of postpartum depression with some previous pregnancies. Member reports that she is unsure if she "believes in" counseling. Please see if she would be willing to see you. I have also encouraged her to reach out to her PCP to see if medication management may be needed as well.     Thank You Cassandra

## 2023-08-06 ENCOUNTER — Telehealth: Payer: Self-pay

## 2023-08-06 ENCOUNTER — Telehealth: Payer: Self-pay | Admitting: Family Medicine

## 2023-08-06 NOTE — Telephone Encounter (Signed)
STATE EPI CONSULTATION  Was alerted to this patient's situation 08/06/2023. I have contacted the [facility] Epidemiologist on call for further guidance.   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   Previous titer in Epic was 1:128 on 06/15/2021, found incidentally by [facility] OB/GYN Dr. Victorino Dike Hui-Yu Tang on a pre-operative screen while undergoing D&C for miscarriage. [facility] notes in "Care Everywhere" documented the patient received treatment at "the health department". Unknown HD location.  Given a 16 fold decrease in titer, patient report of treatment, and [facility] documents, it was felt in May 2024 that this patient did not warrant further treatment. However, we have been unable to locate definitive documentation of this patient's treatment.   State Epi on call has found that she was treated in [facility]. If [facility] Department is unable to produce definitive documentation, this patient will need to be re-treated appropriately.   Fayette Pho, MD

## 2023-08-06 NOTE — Telephone Encounter (Signed)
TC to [place] to inform of need for treatment appointment for Syphilis. LM with number to call. TC to patient emergency contact and her states patient is there with him. Patient came to phone. Patient counseled that as there is no evidence of treatment for positive 2022 Syphilis test, patient will need to be retreated. Patient states she is with a different partner now. Patient counseled that he will need to make an appointment to be tested and treated as well. Patient is scheduled for 08/07/23 to start treatment.  Patient states understanding of situation. Burt Knack, RN

## 2023-08-07 ENCOUNTER — Telehealth: Payer: Self-pay

## 2023-08-07 NOTE — Telephone Encounter (Signed)
DNKA in [facility] for initiation of syphilis treatment today at 1300. Call to client to ascertain if could come later this pm and left message to call on voicemail with number provided. Tiara, DIS in agency to talk with client. Call to emergency contact (partner) who states she is filling out some paperwork in an office regarding her storage unit. Per partner, they are unable to come later this pm and need appt Tuesday or Wednesday next week in the afternoon then states appt needed on Wednesday. Appt scheduled for 08/12/23 with arrival time of 1300 and Tiara aware. Jossie Ng, RN

## 2023-08-12 ENCOUNTER — Telehealth: Payer: Self-pay

## 2023-08-12 NOTE — Telephone Encounter (Signed)
LM for pt to return my call

## 2023-08-17 ENCOUNTER — Other Ambulatory Visit: Payer: Self-pay

## 2023-08-17 ENCOUNTER — Observation Stay
Admission: EM | Admit: 2023-08-17 | Discharge: 2023-08-17 | Disposition: A | Discharge status: Home / Self Care | Payer: Medicaid Other | Attending: Obstetrics and Gynecology | Admitting: Obstetrics and Gynecology

## 2023-08-17 ENCOUNTER — Encounter: Payer: Self-pay | Admitting: Obstetrics and Gynecology

## 2023-08-17 DIAGNOSIS — O99612 Diseases of the digestive system complicating pregnancy, second trimester: Secondary | ICD-10-CM | POA: Diagnosis not present

## 2023-08-17 DIAGNOSIS — J45909 Unspecified asthma, uncomplicated: Secondary | ICD-10-CM | POA: Diagnosis not present

## 2023-08-17 DIAGNOSIS — Z79899 Other long term (current) drug therapy: Secondary | ICD-10-CM | POA: Insufficient documentation

## 2023-08-17 DIAGNOSIS — M545 Low back pain, unspecified: Secondary | ICD-10-CM | POA: Insufficient documentation

## 2023-08-17 DIAGNOSIS — Z3A27 27 weeks gestation of pregnancy: Secondary | ICD-10-CM | POA: Diagnosis not present

## 2023-08-17 DIAGNOSIS — O99332 Smoking (tobacco) complicating pregnancy, second trimester: Secondary | ICD-10-CM | POA: Insufficient documentation

## 2023-08-17 DIAGNOSIS — O26892 Other specified pregnancy related conditions, second trimester: Secondary | ICD-10-CM | POA: Diagnosis present

## 2023-08-17 DIAGNOSIS — F17210 Nicotine dependence, cigarettes, uncomplicated: Secondary | ICD-10-CM | POA: Diagnosis not present

## 2023-08-17 DIAGNOSIS — O10012 Pre-existing essential hypertension complicating pregnancy, second trimester: Secondary | ICD-10-CM | POA: Diagnosis not present

## 2023-08-17 DIAGNOSIS — K5900 Constipation, unspecified: Secondary | ICD-10-CM

## 2023-08-17 DIAGNOSIS — O99512 Diseases of the respiratory system complicating pregnancy, second trimester: Secondary | ICD-10-CM | POA: Insufficient documentation

## 2023-08-17 NOTE — OB Triage Note (Signed)
Discharge instructions, labor precautions, and follow-up care reviewed with patient and significant other. All questions answered. Patient verbalized understanding. Discharged ambulatory off unit.  

## 2023-08-17 NOTE — OB Triage Note (Signed)
Pt is a 27yo [id number], 27w 2d. She arrived to the unit with complaints of  pelvic pressure stating that it awoke her from her sleep at 0500 and is 8/10 on the pain scale. She states that the pain comes every 15-20 minutes lasting about 30seconds. She denies vaginal bleeding or abnormal discharge and reports positive fetal movement. VS stable, monitors applied and assessing.   Initial FHT 125 at 0827.

## 2023-08-17 NOTE — Discharge Summary (Signed)
Physician Final Progress Note  Patient ID: Nancy Ball MRN: [id number] DOB/AGE: [birth year] 27 y.o.  Admit date: 08/17/2023 Admitting provider: Linzie Collin, MD Discharge date: 08/17/2023   Admission Diagnoses:  1) intrauterine pregnancy at [redacted]w[redacted]d  2) low back pain 3) rectal pressure  Discharge Diagnoses:  Principal Problem:   Indication for care in labor and delivery, antepartum  Sciatic pain Constipation   History of Present Illness: The patient is a 27 y.o. female [phone] at [redacted]w[redacted]d who presents for rectal pressure. She woke up around 4 this morning with the need to have a BM. She was able to have a small BM, but the pain is still there. The pain is just above her "butt bone" on both sides, the pain will last about 30 seconds when it occurs. The pain is intense. She denies contractions, may feel a little pressure in her abdomen. Denies any changes to to her discharge. She normally has a large bowel movementt daily. She endorses +FM. Last had IC a few days ago. She had pain like this in previous pregnancies, she would rest when it would happen. She has never seen Chiro or PT.    Past Medical History:  Diagnosis Date   Anemia complicating pregnancy, third trimester 06/03/2017   Anemia during pregnancy    Reports with all pregnancies   Arthritis    juvenile, stable since 2019, was taking Meloxicam / cortisone injections   Asthma    last SOB episode early 2024   Depression    PP depression - reports with all pregnancies   History of spontaneous abortion 05/2021   History of syphilis 05/2021   Reports diagnosed at [facility]. Reports received 2 injections one time.   Hypertension 07/2016   Post-partum pre-eclampsia   Lupus (HCC)    Reports told by MD in 2015 that had all the signs / symptoms of Lupus, never officially diagnosed per client.   Postpartum depression    Reports with all pregnancies.   Pregnancy 07/09/2018    Past Surgical History:  Procedure Laterality Date    DILATION AND EVACUATION     06/15/2021 at [facility] TOOTH EXTRACTION Bilateral 2013   states she was put to sleep    No current facility-administered medications on file prior to encounter.   Current Outpatient Medications on File Prior to Encounter  Medication Sig Dispense Refill   acetaminophen (TYLENOL) 325 MG tablet Take 2 tablets (650 mg total) by mouth every 4 (four) hours as needed for mild pain or moderate pain. 100 tablet 0   albuterol (VENTOLIN HFA) 108 (90 Base) MCG/ACT inhaler Inhale 2 puffs into the lungs every 6 (six) hours as needed for wheezing or shortness of breath. 8 g 2   diphenhydrAMINE (BENADRYL) 25 mg capsule Take 25 mg by mouth at bedtime as needed.     Prenatal Vit-Fe Fumarate-FA (PRENATAL MULTIVITAMIN) TABS tablet Take 1 tablet by mouth daily at 12 noon.     promethazine (PHENERGAN) 25 MG tablet Take 1 tablet (25 mg total) by mouth every 6 (six) hours as needed for nausea or vomiting. 30 tablet 2   escitalopram (LEXAPRO) 20 MG tablet Take 20 mg by mouth daily. (Patient not taking: Reported on 04/15/2023)     metoCLOPramide (REGLAN) 10 MG tablet Take 1 tablet (10 mg total) by mouth every 6 (six) hours as needed for refractory nausea / vomiting. (Patient not taking: Reported on 04/30/2023) 20 tablet 0   ondansetron (ZOFRAN-ODT) 4 MG disintegrating tablet Take  1 tablet (4 mg total) by mouth every 8 (eight) hours as needed for nausea or vomiting. (Patient not taking: Reported on 08/04/2023) 20 tablet 0    Allergies  Allergen Reactions   Covid-19 Mrna Vacc (Moderna) Hives   Excedrin Migraine [Aspirin-Acetaminophen-Caffeine] Rash   Shellfish Allergy Anaphylaxis    Social History   Socioeconomic History   Marital status: Legally Separated    Spouse name: Kipp Laurence (fiance, FOB). Remains legally separated from spouse.   Number of children: 4   Years of education: 12   Highest education level: High school graduate  Occupational History   Occupation: Hospital doctor: [employer]    Comment: 40 hours / week  Tobacco Use   Smoking status: Every Day    Current packs/day: 1.00    Average packs/day: 1 pack/day for 8.0 years (8.0 ttl pk-yrs)    Types: Cigarettes    Passive exposure: Current   Smokeless tobacco: Never   Tobacco comments:    Cigarette smoker since age 72 years old. Currently cutting back use from 1 ppd and smoking 2 cigarettes daily. States difficult, especially when stressed. Counseled regarding [place] Quit[employer] and card given.  Vaping Use   Vaping status: Never Used  Substance and Sexual Activity   Alcohol use: Not Currently    Comment: Last ETOH usewas beer in 03/2023 (none since found out pregnant)   Drug use: Not Currently    Types: Marijuana    Comment: Last marijuana use 03/2023 (none since found out pregnant)   Sexual activity: Yes    Birth control/protection: Pill, Injection, Condom    Comment: Last Depo in 2022. Last BCM used was condoms.  Other Topics Concern   Not on file  Social History Narrative   Lives with fiance and youngest child. FOB begins job at [employer] next week. Client reports her job at [employer] will be changing to 12 hour shifts and will be working 1830 - 0630. States current partner or FOB will keep her child. Other 3 children live with their father in [place] and client sees them regularly.      Per client, mother used drugs and she was raised by her father with no interactions with mother. Father died in 12/13/22.   Social Determinants of Health   Financial Resource Strain: Low Risk  (04/30/2023)   Overall Financial Resource Strain (CARDIA)    Difficulty of Paying Living Expenses: Not very hard  Food Insecurity: No Food Insecurity (04/30/2023)   Hunger Vital Sign    Worried About Running Out of Food in the Last Year: Never true    Ran Out of Food in the Last Year: Never true  Transportation Needs: No Transportation Needs (04/30/2023)   PRAPARE - Therapist, art (Medical): No    Lack of Transportation (Non-Medical): No  Physical Activity: Not on file  Stress: Not on file  Social Connections: Not on file  Intimate Partner Violence: Not At Risk (04/30/2023)   Humiliation, Afraid, Rape, and Kick questionnaire    Fear of Current or Ex-Partner: No    Emotionally Abused: No    Physically Abused: No    Sexually Abused: No    Family History  Problem Relation Age of Onset   Drug abuse Mother    Hypertension Father    Heart attack Father    CVA Father    Ulcers Father    Alcohol abuse Father    Healthy Daughter  Healthy Daughter    Healthy Son    Healthy Son    Breast cancer Paternal Aunt    Seizures Paternal Aunt    Seizures Paternal Aunt    Ovarian cancer Paternal Grandmother    Lung cancer Paternal Grandfather    Healthy Half-Brother    Healthy Half-Brother    Healthy Half-Brother      ROS see HPI   Physical Exam: BP 104/62 (BP Location: Left Arm)   Pulse 65   Temp 98.8 F (37.1 C) (Oral)   Resp 16   Ht 4\' 11"  (1.499 m)   Wt 64.9 kg   LMP 02/02/2023 (Within Days)   BMI 28.88 kg/m   Physical Exam Constitutional:      Appearance: Normal appearance.  Genitourinary:     Vulva normal.     Genitourinary Comments: SSE cervix pink, no lesions, parous looking. Thick in appearance. Physiologic discharge present.   Abdominal:     Tenderness: There is no abdominal tenderness.     Comments: gravid  Musculoskeletal:     Cervical back: Normal range of motion.     Right lower leg: No edema.     Left lower leg: No edema.  Neurological:     General: No focal deficit present.     Mental Status: She is alert.  Skin:    General: Skin is warm.  Psychiatric:        Mood and Affect: Mood normal.   EFM: baseline 120, moderate variability, pos accel (10x10), neg decel TOCO: none   Consults: None  Significant Findings/ Diagnostic Studies: NA  Procedures: RNST  Hospital Course: The patient was admitted to  Labor and Delivery Triage for observation. She had a RNST. Spec exam was not concerning for labor. Reviewed sciatic pain and constipation. Pt discharge home. Pt ot follow up with routine care.   Discharge Condition: good  Disposition: Discharge disposition: 01-Home or Self Care       Diet: Regular diet  Discharge Activity: Activity as tolerated   Allergies as of 08/17/2023       Reactions   Covid-19 Mrna Vacc (moderna) Hives   Excedrin Migraine [aspirin-acetaminophen-caffeine] Rash   Shellfish Allergy Anaphylaxis        Medication List     STOP taking these medications    acetaminophen 325 MG tablet Commonly known as: Tylenol       TAKE these medications    albuterol 108 (90 Base) MCG/ACT inhaler Commonly known as: VENTOLIN HFA Inhale 2 puffs into the lungs every 6 (six) hours as needed for wheezing or shortness of breath.   diphenhydrAMINE 25 mg capsule Commonly known as: BENADRYL Take 25 mg by mouth at bedtime as needed.   escitalopram 20 MG tablet Commonly known as: LEXAPRO Take 20 mg by mouth daily.   metoCLOPramide 10 MG tablet Commonly known as: REGLAN Take 1 tablet (10 mg total) by mouth every 6 (six) hours as needed for refractory nausea / vomiting.   ondansetron 4 MG disintegrating tablet Commonly known as: ZOFRAN-ODT Take 1 tablet (4 mg total) by mouth every 8 (eight) hours as needed for nausea or vomiting.   prenatal multivitamin Tabs tablet Take 1 tablet by mouth daily at 12 noon.   promethazine 25 MG tablet Commonly known as: PHENERGAN Take 1 tablet (25 mg total) by mouth every 6 (six) hours as needed for nausea or vomiting.         Total time spent taking care of this patient: 20 minutes  SignedEllouise Newer Marthena Whitmyer, CNM  08/17/2023, 9:25 AM

## 2023-08-18 ENCOUNTER — Ambulatory Visit: Payer: Medicaid Other | Admitting: Advanced Practice Midwife

## 2023-08-18 VITALS — BP 101/63 | HR 67 | Temp 97.6°F | Wt 144.8 lb

## 2023-08-18 DIAGNOSIS — O0993 Supervision of high risk pregnancy, unspecified, third trimester: Secondary | ICD-10-CM

## 2023-08-18 DIAGNOSIS — Z23 Encounter for immunization: Secondary | ICD-10-CM | POA: Diagnosis not present

## 2023-08-18 DIAGNOSIS — Z8619 Personal history of other infectious and parasitic diseases: Secondary | ICD-10-CM

## 2023-08-18 DIAGNOSIS — O0990 Supervision of high risk pregnancy, unspecified, unspecified trimester: Secondary | ICD-10-CM

## 2023-08-18 LAB — HEMOGLOBIN, FINGERSTICK: Hemoglobin: 11.6 g/dL (ref 11.1–15.9)

## 2023-08-18 MED ORDER — PENICILLIN G BENZATHINE 1200000 UNIT/2ML IM SUSY
2.4000 10*6.[IU] | PREFILLED_SYRINGE | INTRAMUSCULAR | Status: AC
Start: 2023-08-18 — End: 2023-09-08

## 2023-08-18 NOTE — Progress Notes (Signed)
Patient here for visit at 27 3/7. 28 week labs today. Lab draw for 1 hour gtt at 12:06. Patient aware of [facility] U/S tomorrow. Also aware needs treatment for Syphilis and States rep coming to talk with client. Will schedule next MH RV for 1-2 weeks.. Tdap given, tolerated well, VIS and NCIR given.Burt Knack, RN

## 2023-08-18 NOTE — Progress Notes (Signed)
First dose Bicillin given today, and patient scheduled to return on 08/25/23 for second dose and on 09/01/23 for MH RV at 3rd dose Bicillin. Hgb today = 11.6, no treatment indicated.Burt Knack, RN

## 2023-08-19 ENCOUNTER — Other Ambulatory Visit: Payer: Self-pay | Admitting: *Deleted

## 2023-08-19 ENCOUNTER — Encounter: Payer: Self-pay | Admitting: *Deleted

## 2023-08-19 ENCOUNTER — Other Ambulatory Visit: Payer: Self-pay | Admitting: Physician Assistant

## 2023-08-19 ENCOUNTER — Ambulatory Visit: Payer: Medicaid Other | Attending: Physician Assistant

## 2023-08-19 ENCOUNTER — Ambulatory Visit: Payer: Medicaid Other | Admitting: *Deleted

## 2023-08-19 VITALS — BP 105/54 | HR 72

## 2023-08-19 DIAGNOSIS — O99512 Diseases of the respiratory system complicating pregnancy, second trimester: Secondary | ICD-10-CM

## 2023-08-19 DIAGNOSIS — O365930 Maternal care for other known or suspected poor fetal growth, third trimester, not applicable or unspecified: Secondary | ICD-10-CM

## 2023-08-19 DIAGNOSIS — O0990 Supervision of high risk pregnancy, unspecified, unspecified trimester: Secondary | ICD-10-CM | POA: Insufficient documentation

## 2023-08-19 DIAGNOSIS — O99333 Smoking (tobacco) complicating pregnancy, third trimester: Secondary | ICD-10-CM | POA: Diagnosis not present

## 2023-08-19 DIAGNOSIS — F17210 Nicotine dependence, cigarettes, uncomplicated: Secondary | ICD-10-CM

## 2023-08-19 DIAGNOSIS — O99519 Diseases of the respiratory system complicating pregnancy, unspecified trimester: Secondary | ICD-10-CM | POA: Diagnosis present

## 2023-08-19 DIAGNOSIS — O09293 Supervision of pregnancy with other poor reproductive or obstetric history, third trimester: Secondary | ICD-10-CM | POA: Diagnosis not present

## 2023-08-19 DIAGNOSIS — J45909 Unspecified asthma, uncomplicated: Secondary | ICD-10-CM

## 2023-08-19 DIAGNOSIS — O365990 Maternal care for other known or suspected poor fetal growth, unspecified trimester, not applicable or unspecified: Secondary | ICD-10-CM

## 2023-08-19 DIAGNOSIS — Z3A27 27 weeks gestation of pregnancy: Secondary | ICD-10-CM

## 2023-08-19 LAB — GLUCOSE, 1 HOUR GESTATIONAL: Gestational Diabetes Screen: 134 mg/dL (ref 70–139)

## 2023-08-20 ENCOUNTER — Telehealth: Payer: Self-pay

## 2023-08-20 ENCOUNTER — Encounter: Payer: Self-pay | Admitting: Physician Assistant

## 2023-08-20 DIAGNOSIS — O365990 Maternal care for other known or suspected poor fetal growth, unspecified trimester, not applicable or unspecified: Secondary | ICD-10-CM | POA: Insufficient documentation

## 2023-08-20 LAB — RPR, QUANT+TP ABS (REFLEX)
Rapid Plasma Reagin, Quant: 1:8 {titer} — ABNORMAL HIGH
T Pallidum Abs: REACTIVE — AB

## 2023-08-20 LAB — RPR: RPR Ser Ql: REACTIVE — AB

## 2023-08-20 LAB — HIV-1/HIV-2 QUALITATIVE RNA
HIV-1 RNA, Qualitative: NONREACTIVE
HIV-2 RNA, Qualitative: NONREACTIVE

## 2023-08-20 NOTE — Progress Notes (Signed)
Revewied fetal assessment with diagnosis of severe IUGR, updated problem list. Note plan for NST/UA doppler in 1 week and fetal growth [place] in 2 weeks with discussion of delivery planning then.

## 2023-08-20 NOTE — Telephone Encounter (Signed)
Error. Jossie Ng, RN

## 2023-08-25 ENCOUNTER — Telehealth: Payer: Self-pay

## 2023-08-25 ENCOUNTER — Other Ambulatory Visit: Payer: Self-pay | Admitting: *Deleted

## 2023-08-25 DIAGNOSIS — O365990 Maternal care for other known or suspected poor fetal growth, unspecified trimester, not applicable or unspecified: Secondary | ICD-10-CM

## 2023-08-25 NOTE — Telephone Encounter (Signed)
Patient called desiring to cancel today's appointment and reschedule for later this week. Rescheduled for Thursday afternoon the 5th. BTHIELE RN

## 2023-08-26 NOTE — Telephone Encounter (Signed)
NO ADDITIONAL NOTES REQUIRED

## 2023-08-27 ENCOUNTER — Telehealth: Payer: Self-pay

## 2023-08-27 NOTE — Telephone Encounter (Signed)
Call to client as [facility] for 2nd set of Bicillin injections 08/27/23. 1st set given 08/18/23 with 2nd set scheduled for 08/25/23. This appt was rescheduled by client for 08/27/23.  Per Justice Rocher RN-STD Coordinator, if client does not receive 2nd set of Bicillin injections 08/28/23 (10 days from initial injections), series will need to restarted.  Call to client and left message regarding above and requested she call [facility] at 8 am 08/28/23 to determine best time for her to come for injections.on Friday. Jossie Ng, RN

## 2023-08-28 ENCOUNTER — Ambulatory Visit: Payer: Medicaid Other | Attending: Obstetrics and Gynecology

## 2023-08-28 ENCOUNTER — Ambulatory Visit: Payer: Medicaid Other | Admitting: *Deleted

## 2023-08-28 ENCOUNTER — Ambulatory Visit (HOSPITAL_BASED_OUTPATIENT_CLINIC_OR_DEPARTMENT_OTHER): Payer: Medicaid Other | Admitting: *Deleted

## 2023-08-28 VITALS — BP 107/62 | HR 78

## 2023-08-28 DIAGNOSIS — O365990 Maternal care for other known or suspected poor fetal growth, unspecified trimester, not applicable or unspecified: Secondary | ICD-10-CM | POA: Insufficient documentation

## 2023-08-28 DIAGNOSIS — O99513 Diseases of the respiratory system complicating pregnancy, third trimester: Secondary | ICD-10-CM

## 2023-08-28 DIAGNOSIS — O09293 Supervision of pregnancy with other poor reproductive or obstetric history, third trimester: Secondary | ICD-10-CM | POA: Diagnosis not present

## 2023-08-28 DIAGNOSIS — Z3A28 28 weeks gestation of pregnancy: Secondary | ICD-10-CM | POA: Insufficient documentation

## 2023-08-28 DIAGNOSIS — O99333 Smoking (tobacco) complicating pregnancy, third trimester: Secondary | ICD-10-CM

## 2023-08-28 DIAGNOSIS — O365930 Maternal care for other known or suspected poor fetal growth, third trimester, not applicable or unspecified: Secondary | ICD-10-CM | POA: Diagnosis present

## 2023-08-28 DIAGNOSIS — J45909 Unspecified asthma, uncomplicated: Secondary | ICD-10-CM

## 2023-08-28 DIAGNOSIS — F17210 Nicotine dependence, cigarettes, uncomplicated: Secondary | ICD-10-CM | POA: Diagnosis not present

## 2023-08-28 DIAGNOSIS — O99519 Diseases of the respiratory system complicating pregnancy, unspecified trimester: Secondary | ICD-10-CM | POA: Diagnosis present

## 2023-08-28 NOTE — Procedures (Signed)
Nancy Ball [birth year] [redacted]w[redacted]d  Fetus A Non-Stress Test Interpretation for 08/28/23  Indication: IUGR  Fetal Heart Rate A Mode: External Baseline Rate (A): 135 bpm Variability: Moderate Accelerations: 10 x 10 Decelerations: None Multiple birth?: No  Uterine Activity Mode: Toco Contraction Frequency (min): none Resting Tone Palpated: Relaxed  Interpretation (Fetal Testing) Nonstress Test Interpretation: Reactive Comments: Tracing reviewed byDr. Judeth Cornfield

## 2023-09-01 ENCOUNTER — Ambulatory Visit: Payer: Medicaid Other

## 2023-09-01 ENCOUNTER — Encounter: Payer: Self-pay | Admitting: *Deleted

## 2023-09-01 ENCOUNTER — Telehealth: Payer: Self-pay

## 2023-09-01 NOTE — Telephone Encounter (Signed)
[facility] in [facility] for RV and re-start of Bicillin injections. Call to client on mobile number and only immediate beeping sound heard. Call to emergency contact (partner) and left message requesting assistance contacting client and number to call provided. Call to emergency contact (mother) and left message requesting assistance contacting client and number to call provided. Jossie Ng, RN

## 2023-09-01 NOTE — Telephone Encounter (Signed)
Phone encounter cancelled - refer to encounter opened today. Jossie Ng, RN

## 2023-09-03 ENCOUNTER — Ambulatory Visit: Payer: Medicaid Other

## 2023-09-03 ENCOUNTER — Ambulatory Visit: Payer: Medicaid Other | Attending: Obstetrics and Gynecology

## 2023-09-03 VITALS — BP 112/63 | HR 63

## 2023-09-03 DIAGNOSIS — O99519 Diseases of the respiratory system complicating pregnancy, unspecified trimester: Secondary | ICD-10-CM | POA: Diagnosis present

## 2023-09-03 DIAGNOSIS — O365990 Maternal care for other known or suspected poor fetal growth, unspecified trimester, not applicable or unspecified: Secondary | ICD-10-CM

## 2023-09-03 DIAGNOSIS — F17210 Nicotine dependence, cigarettes, uncomplicated: Secondary | ICD-10-CM

## 2023-09-03 DIAGNOSIS — O365930 Maternal care for other known or suspected poor fetal growth, third trimester, not applicable or unspecified: Secondary | ICD-10-CM

## 2023-09-03 DIAGNOSIS — O99513 Diseases of the respiratory system complicating pregnancy, third trimester: Secondary | ICD-10-CM

## 2023-09-03 DIAGNOSIS — J45909 Unspecified asthma, uncomplicated: Secondary | ICD-10-CM | POA: Insufficient documentation

## 2023-09-03 DIAGNOSIS — O09293 Supervision of pregnancy with other poor reproductive or obstetric history, third trimester: Secondary | ICD-10-CM

## 2023-09-03 DIAGNOSIS — O99333 Smoking (tobacco) complicating pregnancy, third trimester: Secondary | ICD-10-CM

## 2023-09-03 DIAGNOSIS — Z3A29 29 weeks gestation of pregnancy: Secondary | ICD-10-CM

## 2023-09-04 NOTE — Telephone Encounter (Signed)
Left voice message on Graves,Keywon (Spouse) voice mail for Gwendolynn to call health department and schedule appointment at [phone]. When called patient's phone number beeping sound heard only. BTHIELE RN

## 2023-09-09 ENCOUNTER — Encounter: Payer: Self-pay | Admitting: *Deleted

## 2023-09-11 ENCOUNTER — Ambulatory Visit: Payer: Medicaid Other | Attending: Obstetrics and Gynecology

## 2023-09-11 ENCOUNTER — Ambulatory Visit: Payer: Medicaid Other

## 2023-09-14 NOTE — Telephone Encounter (Signed)
Left voice message on spouse's voice mail [name]) for Nancy Ball to call health department and schedule appointment at [phone]. When called patient's phone number beeping sound heard only. BTHIELE RN

## 2023-09-18 ENCOUNTER — Ambulatory Visit: Payer: Medicaid Other | Attending: Obstetrics and Gynecology

## 2023-09-18 ENCOUNTER — Ambulatory Visit: Payer: Medicaid Other

## 2023-09-24 NOTE — Telephone Encounter (Signed)
  Left voice message on spouse's voice mail [name]) for Dale to call health department and schedule appointment at [phone]. When called patient's phone number beeping sound heard only. Called patient's mother phone number, unable to leave message "voice box full". BTHIELE RN

## 2023-10-07 NOTE — Telephone Encounter (Signed)
Per [facility] appt desk, client now has [facility] RV appt / syphilis treatment appt scheduled for 10/12/23. Jossie Ng, RN

## 2023-10-12 ENCOUNTER — Ambulatory Visit: Payer: Medicaid Other | Admitting: Advanced Practice Midwife

## 2023-10-12 ENCOUNTER — Telehealth: Payer: Self-pay

## 2023-10-12 VITALS — BP 105/66 | Wt 147.4 lb

## 2023-10-12 DIAGNOSIS — O365990 Maternal care for other known or suspected poor fetal growth, unspecified trimester, not applicable or unspecified: Secondary | ICD-10-CM

## 2023-10-12 DIAGNOSIS — O09899 Supervision of other high risk pregnancies, unspecified trimester: Secondary | ICD-10-CM

## 2023-10-12 DIAGNOSIS — Z23 Encounter for immunization: Secondary | ICD-10-CM | POA: Diagnosis not present

## 2023-10-12 DIAGNOSIS — Z8759 Personal history of other complications of pregnancy, childbirth and the puerperium: Secondary | ICD-10-CM

## 2023-10-12 DIAGNOSIS — O99310 Alcohol use complicating pregnancy, unspecified trimester: Secondary | ICD-10-CM

## 2023-10-12 DIAGNOSIS — O0990 Supervision of high risk pregnancy, unspecified, unspecified trimester: Secondary | ICD-10-CM

## 2023-10-12 DIAGNOSIS — Z8659 Personal history of other mental and behavioral disorders: Secondary | ICD-10-CM

## 2023-10-12 DIAGNOSIS — Z91199 Patient's noncompliance with other medical treatment and regimen due to unspecified reason: Secondary | ICD-10-CM

## 2023-10-12 DIAGNOSIS — Z8619 Personal history of other infectious and parasitic diseases: Secondary | ICD-10-CM

## 2023-10-12 LAB — URINALYSIS
Bilirubin, UA: NEGATIVE
Glucose, UA: NEGATIVE
Ketones, UA: NEGATIVE
Nitrite, UA: NEGATIVE
RBC, UA: NEGATIVE
Specific Gravity, UA: 1.03 (ref 1.005–1.030)
Urobilinogen, Ur: 0.2 mg/dL (ref 0.2–1.0)
pH, UA: 6.5 (ref 5.0–7.5)

## 2023-10-12 MED ORDER — PENICILLIN G BENZATHINE 1200000 UNIT/2ML IM SUSY
2.4000 10*6.[IU] | PREFILLED_SYRINGE | Freq: Once | INTRAMUSCULAR | Status: AC
Start: 2023-10-12 — End: 2023-10-12
  Administered 2023-10-12: 2.4 10*6.[IU] via INTRAMUSCULAR

## 2023-10-12 NOTE — Progress Notes (Signed)
Counseled on recommendation for flu vaccine for those 29 months of age or older. Accepted vaccine today and tolerated without complaint. Counseled on recommendation for RSV vaccine in pregnancy. Desires at 1 week follow-up appt next week. 1 week MHC RV appt scheduled and reminder card given. Kept 08/28/23 and 09/03/23 MFM ([place]) appts. [facility] at [facility]) MFM 09/11/23 and 09/18/23. No further MFM appts scheduled at this time per appt desk. Jossie Ng, RN Urine dip reviewed by Hazle Coca CNM. Jossie Ng, RN

## 2023-10-12 NOTE — Telephone Encounter (Signed)
MFM [place], BPP with NST and fetal umbilical artery doppler ordered by E. Sciora CNM 10/12/23. Per scheduler at [facility], new referral not needed currently as can reschedule above tests since 09/11/23 and 09/18/23 appts for those tests missed by client. Client now has appt for testing on 10/28/23 with arrival time of 1415. Per scheduler, MD / sonographer will review what is needed for client. Call to client to notify her of 10/28/23 1415 MFM appt in [place]. Left message to call with number provided. Jossie Ng, RN

## 2023-10-12 NOTE — Progress Notes (Signed)
Pt needs treatment restarted as did not complete last tx on 08/27/23 and 09/01/23 Please tx syphilis with Bicillin first dose 2.4 [facility]  PRENATAL VISIT NOTE  Subjective:  Nancy Ball is a 27 y.o. [id number] at [redacted]w[redacted]d being seen today for ongoing prenatal care.  She is currently monitored for the following issues for this high-risk pregnancy and has History of pre-eclampsia; History of pp depression x 4; Supervision of high-risk pregnancy, unspecified trimester; History of syphilis; Asthma affecting pregnancy, antepartum; Fetal growth restriction antepartum, severe; Noncompliant pregnant patient (no prenatal care x 9 1/2 wks); and Alcohol use affecting pregnancy; last use 09/26/23 (1 glass wine) on their problem list.  Patient reports  difficulty sleeping .  Contractions: Not present. Vag. Bleeding: None.  Movement: Present. Denies leaking of fluid/ROM.   The following portions of the patient's history were reviewed and updated as appropriate: allergies, current medications, past family history, past medical history, past social history, past surgical history and problem list. Problem list updated.  Objective:  There were no vitals filed for this visit.  Fetal Status: Fetal Heart Rate (bpm): 123 Fundal Height: 32 cm Movement: Present  Presentation: Vertex  General:  Alert, oriented and cooperative. Patient is in no acute distress.  Skin: Skin is warm and dry. No rash noted.   Cardiovascular: Normal heart rate noted  Respiratory: Normal respiratory effort, no problems with respiration noted  Abdomen: Soft, gravid, appropriate for gestational age.  Pain/Pressure: Absent     Pelvic: Cervical exam deferred        Extremities: Normal range of motion.  Edema: None  Mental Status: Normal mood and affect. Normal behavior. Normal judgment and thought content.   Assessment and Plan:  Pregnancy: [id number] at [redacted]w[redacted]d  1. Supervision of  high-risk pregnancy, unspecified trimester Pt denies use of cigs since 07/16/23 FOB present Not taking ASA 81 mg Has car seat and crib Esmeralda Links, RN [facility] in to talk to pt 1 hour glucola=134 on 08/18/23 Walking around the block with her kids 3x/wk x 10 min 22 lb 6 oz (10.1 kg) 3 lb wt gain in last 9 1/2 wks; pt states has an appetite and eats and has food at home No prenatal care since 08/04/23 C/o not able to fall asleep and then awakens frequently mostly because 27 yo gets into bed with her; suggestions given  - Urinalysis (Urine Dip) - ToxAssure Flex 15, Ur  2. History of syphilis Pt still insists that she does not have syphilis because she was treated for it in [place] in 2022 Last apt at [place] [facility] was in 2012 Tierra from the [place] spoke with pt on 08/18/23 and tx given RPR reactive 1:8 on 08/18/23 Pt has not come in for prenatal care since 08/04/23 so tx'd today along with FOB  - penicillin g benzathine (BICILLIN LA) 1200000 UNIT/2ML injection 2.4 Million Units  3. History of depression In prior appts pt states she is not comfortable speaking to our counselor here but instead wants to talk to her primary care MD at [facility]--pt called him and they discussed pros and cons of antidepressant and pt wants to wait until pp to begin taking antidepressant Pt has had pp depression x 4  4. History of pre-eclampsia 105/66 today  5. Fetal growth restriction antepartum, severe dx'd 08/19/23 At that time EFW=2% and AC=3% and MFM recommended UA dopplers weekly with serial growth u/s's q 2 wks and delivery at  37 wks Last u/s 09/03/23 at 28 2/7 with posterior placenta, vtx, AFI wnl, EFW=2.7%, AC=9%, dopplers wnl, BPP  8/8 D[facility]A 09/11/23 u/s and [facility] 09/18/23 u/s Discussed noncompliance and last u/s with pt who states, "the doctor at that u/s said my baby was fine". Showed pt u/s report, findings, recommendations, and explained potential implications to fetus including IUFD MFM u/s ordered  for weekly dopplers, growth  u/s as recommended by MFM  - [place] MFM OB FOLLOW UP; Future  6. Noncompliant pregnant patient, antepartum No prenatal care x 9 1/2 wks "I've been at home"  7. Maternal alcohol use complicating pregnancy, antepartum Odor of alcohol in exam room. Pt states last ETOH 09/26/23 (1 glass wine); counseled pt not to drink ETOH during pregnancy   Preterm labor symptoms and general obstetric precautions including but not limited to vaginal bleeding, contractions, leaking of fluid and fetal movement were reviewed in detail with the patient. Please refer to After Visit Summary for other counseling recommendations.  Return in about 1 week (around 10/19/2023) for routine PNC, 36 wk labs.  Future Appointments  Date Time Provider Department Center  10/19/2023  1:30 PM [facility] PROVIDER [facility] None    Alberteen Spindle, CNM

## 2023-10-12 NOTE — Telephone Encounter (Signed)
Return call from client and notified of 10/28/23 1415 MFM [facility] appt for multiple tests. Jossie Ng, RN d

## 2023-10-16 ENCOUNTER — Telehealth: Payer: Self-pay | Admitting: Family Medicine

## 2023-10-16 LAB — TOXASSURE FLEX 15, UR
6-ACETYLMORPHINE IA: NEGATIVE ng/mL
7-aminoclonazepam: NOT DETECTED ng/mg{creat}
AMPHETAMINES IA: NEGATIVE ng/mL
Alpha-hydroxyalprazolam: NOT DETECTED ng/mg{creat}
Alpha-hydroxymidazolam: NOT DETECTED ng/mg{creat}
Alpha-hydroxytriazolam: NOT DETECTED ng/mg{creat}
Alprazolam: NOT DETECTED ng/mg{creat}
BARBITURATES IA: NEGATIVE ng/mL
BUPRENORPHINE: NEGATIVE
Benzodiazepines: NEGATIVE
Buprenorphine: NOT DETECTED ng/mg{creat}
Clonazepam: NOT DETECTED ng/mg{creat}
Creatinine: 136 mg/dL
Desalkylflurazepam: NOT DETECTED ng/mg{creat}
Desmethyldiazepam: NOT DETECTED ng/mg{creat}
Desmethylflunitrazepam: NOT DETECTED ng/mg{creat}
Diazepam: NOT DETECTED ng/mg{creat}
ETHYL ALCOHOL Enzymatic: NEGATIVE g/dL
FENTANYL: NEGATIVE
Fentanyl: NOT DETECTED ng/mg{creat}
Flunitrazepam: NOT DETECTED ng/mg{creat}
Lorazepam: NOT DETECTED ng/mg{creat}
METHADONE IA: NEGATIVE ng/mL
METHADONE MTB IA: NEGATIVE ng/mL
Midazolam: NOT DETECTED ng/mg{creat}
Norbuprenorphine: NOT DETECTED ng/mg{creat}
Norfentanyl: NOT DETECTED ng/mg{creat}
OPIATE CLASS IA: NEGATIVE ng/mL
OXYCODONE CLASS IA: NEGATIVE ng/mL
Oxazepam: NOT DETECTED ng/mg{creat}
PHENCYCLIDINE IA: NEGATIVE ng/mL
TAPENTADOL, IA: NEGATIVE ng/mL
TRAMADOL IA: NEGATIVE ng/mL
Temazepam: NOT DETECTED ng/mg{creat}

## 2023-10-16 LAB — COCAINE AND MTB, MS, UR RFX
Benzoylecgonine: 226 ng/mg{creat}
Cocaethylene: NOT DETECTED ng/mg{creat}
Cocaine Confirmation: POSITIVE
Cocaine: NOT DETECTED ng/mg{creat}

## 2023-10-16 LAB — CANNABINOIDS, MS, UR RFX
Cannabinoids Confirmation: POSITIVE
Carboxy-THC: 32 ng/mg{creat}

## 2023-10-16 NOTE — Telephone Encounter (Signed)
-----   Message from Nurse Kellie Shropshire sent at 10/16/2023 11:32 AM EDT ----- Regarding: LAB Please review UDS, cocaine, cannabinoids. Thank you. ----- Message ----- From: Stasia Cavalier, RN Sent: 10/16/2023   9:48 AM EDT To: [facility]-Maternal Health   ----- Message ----- From: Interface, [facility] Lab Results In Sent: 10/12/2023  11:36 AM EDT To: [facility]-Results

## 2023-10-16 NOTE — Telephone Encounter (Signed)
Please give me a call I would like to speak to someone about changing my provider to someone else for my follow up vist

## 2023-10-16 NOTE — Telephone Encounter (Signed)
RN Lynnette flagged me about Nancy Ball wanting to reschedule her prenatal appointments as she would prefer a different provider. See phone note from today regarding that. New plan is continue with next two bicillin shots as planned on Mondays and reschedule prenatal appointments to Wednesdays when a different prenatal provider is working.   Upon chart review, realized this patient has not yet been scheduled for induction at 37 weeks as recommended by MFM for severe IUGR with EFW ~ 2%. Delivering with  OB at [facility]. Paper referral form completed by myself and faxed by RN Jossie Ng before leaving Fri. 10/25.   [redacted]w[redacted]d gestation for this patient will be Saturday, Nov. 2.   For restarted syphilis treatment: Bicillin #1 of 3 given 10/21 Bicillin #2 of 3 scheduled for 10/28 Bicillin #3 of 3 needs to be 11/04; however patient very likely may be in hospital s/p delivery. I have noted on the IOL request that she should receive her bicillin #3 inpatient if she is indeed still admitted. I am doubtful we will successfully get her the third bicillin shot if she does not receive it prior to discharge home with a newborn.   I will message our STI coordinator, Justice Rocher to loop in appropriate state HD officials.   Fayette Pho, MD 10/16/23  5:19 PM

## 2023-10-16 NOTE — Telephone Encounter (Signed)
Consulted with Dr. Larita Fife regarding client's request to not see provider that she did at last appt. No other provider is available to see client that day and failure to get Bicillin injections will necessitate a restart per Dr. Larita Fife. Call to client and counseled regarding above. Per client, she refuses to be seen by  last provider. Per discussion with Sebastian Ache RN - STD Coordinator, client scheduled in [facility] for STI treatment with Bicillin and [facility] nurse to assist at appt.on 10/19/23. Client's [facility] RV appt scheduled until Wednesday and she states that is a good plan for her. Counseled she would most likely need to repeat this same scheduling process for week after next and she states will not be a problem. Jossie Ng, RN

## 2023-10-19 ENCOUNTER — Ambulatory Visit: Payer: Medicaid Other | Admitting: Family Medicine

## 2023-10-19 ENCOUNTER — Ambulatory Visit: Payer: Medicaid Other

## 2023-10-19 ENCOUNTER — Other Ambulatory Visit: Payer: Self-pay | Admitting: Certified Nurse Midwife

## 2023-10-19 ENCOUNTER — Telehealth: Payer: Self-pay

## 2023-10-19 VITALS — BP 110/71 | HR 80 | Temp 98.0°F | Wt 149.4 lb

## 2023-10-19 DIAGNOSIS — Z8759 Personal history of other complications of pregnancy, childbirth and the puerperium: Secondary | ICD-10-CM

## 2023-10-19 DIAGNOSIS — O99310 Alcohol use complicating pregnancy, unspecified trimester: Secondary | ICD-10-CM

## 2023-10-19 DIAGNOSIS — O09899 Supervision of other high risk pregnancies, unspecified trimester: Secondary | ICD-10-CM

## 2023-10-19 DIAGNOSIS — O99320 Drug use complicating pregnancy, unspecified trimester: Secondary | ICD-10-CM

## 2023-10-19 DIAGNOSIS — Z91199 Patient's noncompliance with other medical treatment and regimen due to unspecified reason: Secondary | ICD-10-CM

## 2023-10-19 DIAGNOSIS — F1490 Cocaine use, unspecified, uncomplicated: Secondary | ICD-10-CM

## 2023-10-19 DIAGNOSIS — A539 Syphilis, unspecified: Secondary | ICD-10-CM

## 2023-10-19 DIAGNOSIS — Z8619 Personal history of other infectious and parasitic diseases: Secondary | ICD-10-CM

## 2023-10-19 DIAGNOSIS — O365910 Maternal care for other known or suspected poor fetal growth, first trimester, not applicable or unspecified: Secondary | ICD-10-CM

## 2023-10-19 DIAGNOSIS — O99313 Alcohol use complicating pregnancy, third trimester: Secondary | ICD-10-CM

## 2023-10-19 DIAGNOSIS — O0990 Supervision of high risk pregnancy, unspecified, unspecified trimester: Secondary | ICD-10-CM

## 2023-10-19 DIAGNOSIS — O365990 Maternal care for other known or suspected poor fetal growth, unspecified trimester, not applicable or unspecified: Secondary | ICD-10-CM

## 2023-10-19 DIAGNOSIS — Z3A36 36 weeks gestation of pregnancy: Secondary | ICD-10-CM

## 2023-10-19 DIAGNOSIS — O99330 Smoking (tobacco) complicating pregnancy, unspecified trimester: Secondary | ICD-10-CM | POA: Insufficient documentation

## 2023-10-19 DIAGNOSIS — O0993 Supervision of high risk pregnancy, unspecified, third trimester: Secondary | ICD-10-CM

## 2023-10-19 DIAGNOSIS — F1290 Cannabis use, unspecified, uncomplicated: Secondary | ICD-10-CM

## 2023-10-19 MED ORDER — PENICILLIN G BENZATHINE 1200000 UNIT/2ML IM SUSY
2.4000 10*6.[IU] | PREFILLED_SYRINGE | Freq: Once | INTRAMUSCULAR | Status: AC
Start: 2023-10-19 — End: 2023-10-19
  Administered 2023-10-19: 2.4 10*6.[IU] via INTRAMUSCULAR

## 2023-10-19 NOTE — Progress Notes (Signed)
NST reviewed by provider, Aliene Altes and patient scheduled to return for NST on 10/22/23. Per [facility], patient has appointment for BPP on 10/28/23, will call again tomorrow to see if there are any cancellations and they can get patient in sooner.Burt Knack, RN

## 2023-10-19 NOTE — Telephone Encounter (Signed)
TC to patient to inform her of IOL at [facility] on 10/24/23 at 0800. Patient is aware she will have her 3 dose Bicillin in the hospital before discharge. Patient states understanding. Patient asked about her appointment for NST on 10/22/23 and counseled to keep that appointment. This RN will call [facility] tomorrow to inquire about openings for BPP this week. If BPP can be scheduled, patient will go for BPP rather than NST at [facility]. Patient states understanding and denies further questions at this time. Burt Knack, RN

## 2023-10-19 NOTE — Telephone Encounter (Signed)
Dr. Terrilyn Saver has spoken to [facility] regarding the need for BIC #3.

## 2023-10-19 NOTE — Telephone Encounter (Signed)
[facility] called because they did not see that patient had been scheduled for IOL at 37 weeks. She is going to been seen at [facility] today for syphilis treatment. She wanted to know if she would be in the hospital for her 3rd treatment and that is when she realized she was not on schedule. Should call and have her come into clinic to see someone today. Please advise.

## 2023-10-19 NOTE — Progress Notes (Signed)
Pt is here for Syphilis treatment - BIC #2. Bicillin 2.4 MU given IM.  Pt tolerated well.

## 2023-10-19 NOTE — Progress Notes (Signed)
IOL scheduled per MFM recommendation at 37w EGA 11/2 8am arrival due to fetal growth restriction

## 2023-10-19 NOTE — Progress Notes (Signed)
[facility]  PRENATAL VISIT NOTE  Subjective:  Nancy Ball is a 27 y.o. [id number] at [redacted]w[redacted]d being seen today for ongoing prenatal care.  She is currently monitored for the following issues for this high-risk pregnancy and has History of pre-eclampsia; History of pp depression x 4; Supervision of high-risk pregnancy, unspecified trimester; History of syphilis; Asthma affecting pregnancy, antepartum; Fetal growth restriction antepartum, severe; Noncompliant pregnant patient (no prenatal care x 9 1/2 wks); Alcohol use affecting pregnancy; last use 09/26/23 (1 glass wine); Cocaine use complicating pregnancy; Marijuana use during pregnancy; and Tobacco smoking affecting pregnancy on their problem list.  Patient reports no complaints.  Contractions: Irregular. Vag. Bleeding: None.  Movement: Present. Denies leaking of fluid/ROM.   The following portions of the patient's history were reviewed and updated as appropriate: allergies, current medications, past family history, past medical history, past social history, past surgical history and problem list. Problem list updated.  Objective:   Vitals:   10/19/23 1026  BP: 110/71  Pulse: 80  Temp: 98 F (36.7 C)  Weight: 149 lb 6.4 oz (67.8 kg)    Fetal Status: Fetal Heart Rate (bpm): 133 Fundal Height: 35 cm Movement: Present  Presentation: Vertex  General:  Alert, oriented and cooperative. Patient is in no acute distress.  Skin: Skin is warm and dry. No rash noted.   Cardiovascular: Normal heart rate noted  Respiratory: Normal respiratory effort, no problems with respiration noted  Abdomen: Soft, gravid, appropriate for gestational age.  Pain/Pressure: Absent     Pelvic: Cervical exam deferred        Extremities: Normal range of motion.  Edema: None  Mental Status: Normal mood and affect. Normal behavior. Normal judgment and thought content.   Assessment and Plan:  Pregnancy: [id number] at [redacted]w[redacted]d  1.  Supervision of high-risk pregnancy, unspecified trimester 11/6- has follow up with MFM for doppler; BPP -agrees to stay for NST today, plan for repeat NST on Wed- unless patient can get BPP scheduled this week, RN to call MFM -reviewed BF benefits with baby- patient has concerns since she smokes, encouraged to try breastfeeding if she is interested -counseled that smoking is not a contraindication to BF -reviewed recommendations: do NOT smoke DURING a breast feed, try to smoke immediately after feeding to give maximum time between smoking and breastfeeding episodes, reviewed that any amount of BF is beneficial to baby, encouraged asking for lactation consult during hospital stay -reports her brother is going to help with her kids when she is in labor  2. History of syphilis -had second dose of bicillin today -pt had untreated syphilis for many weeks during pregnancy- non-compliant with coming to clinic  3. Maternal alcohol use complicating pregnancy, antepartum -reports no more drinking since last visit  4. History of pre-eclampsia -denies scotoma, HA, RUQ pain ro edema  5. Fetal growth restriction antepartum, severe -fetus is estimated to be in the 2% -pt was getting weekly doppler and BPP with MFM, missed several appointments, next appointment scheduled for 11/6 -IOL faxed last week for 37 weeks -RN to follow up on scheduling   6. Noncompliant pregnant patient, antepartum -has missed multiple appointments for maternity care, as well as attempts to get treatment for syphilis   7. Cocaine use complicating pregnancy -UDS positive for cocaine last week- pt denies use since about [redacted] weeks pregnant -reviewed why it is recommended to abstain from all substances during pregnancy  -given plan of safe care today  -reviewed tx  options, including [facility]   8. [redacted] weeks gestation of pregnancy -routine testing today  - Chlamydia/GC NAA, Confirmation - Culture, beta strep (group b  only)   Preterm labor symptoms and general obstetric precautions including but not limited to vaginal bleeding, contractions, leaking of fluid and fetal movement were reviewed in detail with the patient. Please refer to After Visit Summary for other counseling recommendations.  Return in about 1 week (around 10/26/2023) for Routine Prenatal Care.  Future Appointments  Date Time Provider Department Center  10/26/2023  9:00 AM [facility] PROVIDER [facility] None  10/28/2023  2:15 PM [facility] NURSE [facility]  10/28/2023  2:30 PM [facility] US4 [facility]  10/28/2023  3:15 PM [facility] NST [facility]    Lenice Llamas, FNP

## 2023-10-20 ENCOUNTER — Encounter: Payer: Self-pay | Admitting: Certified Nurse Midwife

## 2023-10-21 ENCOUNTER — Ambulatory Visit: Payer: Medicaid Other

## 2023-10-21 LAB — CHLAMYDIA/GC NAA, CONFIRMATION
Chlamydia trachomatis, NAA: NEGATIVE
Neisseria gonorrhoeae, NAA: NEGATIVE

## 2023-10-22 ENCOUNTER — Other Ambulatory Visit: Payer: Self-pay

## 2023-10-22 ENCOUNTER — Observation Stay
Admission: EM | Admit: 2023-10-22 | Discharge: 2023-10-22 | Disposition: A | Discharge status: Home / Self Care | Payer: Medicaid Other | Attending: Obstetrics and Gynecology | Admitting: Obstetrics and Gynecology

## 2023-10-22 ENCOUNTER — Ambulatory Visit: Payer: Medicaid Other

## 2023-10-22 ENCOUNTER — Encounter: Payer: Self-pay | Admitting: Physician Assistant

## 2023-10-22 ENCOUNTER — Encounter: Payer: Self-pay | Admitting: Obstetrics and Gynecology

## 2023-10-22 DIAGNOSIS — O288 Other abnormal findings on antenatal screening of mother: Secondary | ICD-10-CM | POA: Diagnosis present

## 2023-10-22 DIAGNOSIS — O99320 Drug use complicating pregnancy, unspecified trimester: Secondary | ICD-10-CM

## 2023-10-22 DIAGNOSIS — J45909 Unspecified asthma, uncomplicated: Secondary | ICD-10-CM | POA: Diagnosis not present

## 2023-10-22 DIAGNOSIS — I10 Essential (primary) hypertension: Secondary | ICD-10-CM | POA: Insufficient documentation

## 2023-10-22 DIAGNOSIS — O99330 Smoking (tobacco) complicating pregnancy, unspecified trimester: Secondary | ICD-10-CM

## 2023-10-22 DIAGNOSIS — O365990 Maternal care for other known or suspected poor fetal growth, unspecified trimester, not applicable or unspecified: Secondary | ICD-10-CM

## 2023-10-22 DIAGNOSIS — O368130 Decreased fetal movements, third trimester, not applicable or unspecified: Secondary | ICD-10-CM | POA: Diagnosis present

## 2023-10-22 DIAGNOSIS — F1290 Cannabis use, unspecified, uncomplicated: Secondary | ICD-10-CM

## 2023-10-22 DIAGNOSIS — Z3A36 36 weeks gestation of pregnancy: Secondary | ICD-10-CM | POA: Insufficient documentation

## 2023-10-22 DIAGNOSIS — O0990 Supervision of high risk pregnancy, unspecified, unspecified trimester: Secondary | ICD-10-CM

## 2023-10-22 DIAGNOSIS — O0993 Supervision of high risk pregnancy, unspecified, third trimester: Secondary | ICD-10-CM

## 2023-10-22 DIAGNOSIS — Z79899 Other long term (current) drug therapy: Secondary | ICD-10-CM | POA: Diagnosis not present

## 2023-10-22 DIAGNOSIS — Z3689 Encounter for other specified antenatal screening: Secondary | ICD-10-CM

## 2023-10-22 DIAGNOSIS — O10013 Pre-existing essential hypertension complicating pregnancy, third trimester: Secondary | ICD-10-CM | POA: Insufficient documentation

## 2023-10-22 DIAGNOSIS — O99513 Diseases of the respiratory system complicating pregnancy, third trimester: Secondary | ICD-10-CM | POA: Insufficient documentation

## 2023-10-22 DIAGNOSIS — Z87891 Personal history of nicotine dependence: Secondary | ICD-10-CM | POA: Insufficient documentation

## 2023-10-22 NOTE — Progress Notes (Signed)
Discharge instructions provided to patient. Patient verbalized understanding. Pt educated on signs and symptoms of labor, vaginal bleeding, LOF, and fetal movement. Red flag signs reviewed by RN. Patient discharged home in stable condition. 

## 2023-10-22 NOTE — Discharge Summary (Signed)
Physician Final Progress Note  Patient ID: Nancy Ball MRN: [id number] DOB/AGE: [birth year] 27 y.o.  Admit date: 10/22/2023 Admitting provider: Hildred Laser, MD Discharge date: 10/22/2023   Admission Diagnoses:  1) intrauterine pregnancy at [redacted]w[redacted]d  2) non-reactive NST in clinic  Discharge Diagnoses:  Active Problems:   NST (non-stress test) reactive    History of Present Illness: The patient is a 27 y.o. female [phone] at [redacted]w[redacted]d who presents for Category II tracing in clinic this morning. She was admitted for observation and placed on monitors. NST is reactive and reassuring while in triage. She admits good fetal movement. She denies vaginal bleeding or leakage of fluid. She does feel an occasional contraction. She is scheduled for induction of labor in 2 days and is discharged to home with instructions and precautions.  Past Medical History:  Diagnosis Date   Anemia complicating pregnancy, third trimester 06/03/2017   Anemia during pregnancy    Reports with all pregnancies   Arthritis    juvenile, stable since 2019, was taking Meloxicam / cortisone injections   Asthma    last SOB episode early 2024   Depression    PP depression - reports with all pregnancies   History of spontaneous abortion 05/2021   History of syphilis 05/2021   Reports diagnosed at [facility]. Reports received 2 injections one time.   Hypertension 07/2016   Post-partum pre-eclampsia   Indication for care in labor and delivery, antepartum 08/17/2023   Lupus    Reports told by MD in 2015 that had all the signs / symptoms of Lupus, never officially diagnosed per client.   Postpartum depression    Reports with all pregnancies.   Pregnancy 07/09/2018   Tobacco abuse  10-3 cpd 04/30/2023   Quit 07/16/23      Past Surgical History:  Procedure Laterality Date   DILATION AND EVACUATION     06/15/2021 at [facility] TOOTH EXTRACTION Bilateral 2013   states she was put to sleep    No current  facility-administered medications on file prior to encounter.   Current Outpatient Medications on File Prior to Encounter  Medication Sig Dispense Refill   acetaminophen (TYLENOL) 500 MG tablet Take 500 mg by mouth every 6 (six) hours as needed.     albuterol (VENTOLIN HFA) 108 (90 Base) MCG/ACT inhaler Inhale 2 puffs into the lungs every 6 (six) hours as needed for wheezing or shortness of breath. 8 g 2   Prenatal Vit-Fe Fumarate-FA (PRENATAL MULTIVITAMIN) TABS tablet Take 1 tablet by mouth daily at 12 noon.     diphenhydrAMINE (BENADRYL) 25 mg capsule Take 25 mg by mouth at bedtime as needed. (Patient not taking: Reported on 10/12/2023)     escitalopram (LEXAPRO) 20 MG tablet Take 20 mg by mouth daily. (Patient not taking: Reported on 04/15/2023)     metoCLOPramide (REGLAN) 10 MG tablet Take 1 tablet (10 mg total) by mouth every 6 (six) hours as needed for refractory nausea / vomiting. (Patient not taking: Reported on 04/30/2023) 20 tablet 0   ondansetron (ZOFRAN-ODT) 4 MG disintegrating tablet Take 1 tablet (4 mg total) by mouth every 8 (eight) hours as needed for nausea or vomiting. (Patient not taking: Reported on 08/04/2023) 20 tablet 0   promethazine (PHENERGAN) 25 MG tablet Take 1 tablet (25 mg total) by mouth every 6 (six) hours as needed for nausea or vomiting. (Patient not taking: Reported on 10/12/2023) 30 tablet 2    Allergies  Allergen Reactions   Covid-19 Mrna  Vacc (Moderna) Hives   Excedrin Migraine [Aspirin-Acetaminophen-Caffeine] Rash   Shellfish Allergy Anaphylaxis    Social History   Socioeconomic History   Marital status: Single    Spouse name: Kipp Laurence (fiance, FOB). Remains legally separated from spouse.   Number of children: 4   Years of education: 12   Highest education level: High school graduate  Occupational History   Occupation: Economist: [employer]    Comment: 40 hours / week  Tobacco Use   Smoking status: Former     Current packs/day: 1.00    Average packs/day: 1 pack/day for 8.0 years (8.0 ttl pk-yrs)    Types: Cigarettes    Passive exposure: Current   Smokeless tobacco: Never   Tobacco comments:    Cigarette smoker since age 41 years old. Currently cutting back use from 1 ppd and smoking 2 cigarettes daily. States difficult, especially when stressed. Counseled regarding  QuitLIne and card given.  Vaping Use   Vaping status: Never Used  Substance and Sexual Activity   Alcohol use: Not Currently    Comment: Last ETOH usewas beer in 03/2023 (none since found out pregnant)   Drug use: Not Currently    Types: Marijuana    Comment: Last marijuana use 03/2023 (none since found out pregnant)   Sexual activity: Yes    Birth control/protection: Pill, Injection, Condom    Comment: Last Depo in 2022. Last BCM used was condoms.  Other Topics Concern   Not on file  Social History Narrative   Lives with fiance and youngest child. FOB begins job at [employer] next week. Client reports her job at [employer] will be changing to 12 hour shifts and will be working 1830 - 0630. States current partner or FOB will keep her child. Other 3 children live with their father in [place] and client sees them regularly.      Per client, mother used drugs and she was raised by her father with no interactions with mother. Father died in 2022/12/18.   Social Determinants of Health   Financial Resource Strain: Low Risk  (04/30/2023)   Overall Financial Resource Strain (CARDIA)    Difficulty of Paying Living Expenses: Not very hard  Food Insecurity: No Food Insecurity (04/30/2023)   Hunger Vital Sign    Worried About Running Out of Food in the Last Year: Never true    Ran Out of Food in the Last Year: Never true  Transportation Needs: No Transportation Needs (04/30/2023)   PRAPARE - Administrator, Civil Service (Medical): No    Lack of Transportation (Non-Medical): No  Physical Activity: Not on file   Stress: Not on file  Social Connections: Not on file  Intimate Partner Violence: Not At Risk (04/30/2023)   Humiliation, Afraid, Rape, and Kick questionnaire    Fear of Current or Ex-Partner: No    Emotionally Abused: No    Physically Abused: No    Sexually Abused: No    Family History  Problem Relation Age of Onset   Drug abuse Mother    Hypertension Father    Heart attack Father    CVA Father    Ulcers Father    Alcohol abuse Father    Healthy Daughter    Healthy Daughter    Healthy Son    Healthy Son    Breast cancer Paternal Aunt    Seizures Paternal Aunt    Seizures Paternal Aunt    Ovarian cancer  Paternal Grandmother    Lung cancer Paternal Grandfather    Healthy Half-Brother    Healthy Half-Brother    Healthy Half-Brother      Review of Systems  Constitutional:  Negative for chills and fever.  HENT:  Negative for congestion, ear discharge, ear pain, hearing loss, sinus pain and sore throat.   Eyes:  Negative for blurred vision and double vision.  Respiratory:  Negative for cough, shortness of breath and wheezing.   Cardiovascular:  Negative for chest pain, palpitations and leg swelling.  Gastrointestinal:  Negative for abdominal pain, blood in stool, constipation, diarrhea, heartburn, melena, nausea and vomiting.  Genitourinary:  Negative for dysuria, flank pain, frequency, hematuria and urgency.  Musculoskeletal:  Negative for back pain, joint pain and myalgias.  Skin:  Negative for itching and rash.  Neurological:  Negative for dizziness, tingling, tremors, sensory change, speech change, focal weakness, seizures, loss of consciousness, weakness and headaches.  Endo/Heme/Allergies:  Negative for environmental allergies. Does not bruise/bleed easily.  Psychiatric/Behavioral:  Negative for depression, hallucinations, memory loss, substance abuse and suicidal ideas. The patient is not nervous/anxious and does not have insomnia.      Physical Exam: BP 110/61 (BP  Location: Left Arm)   Pulse 83   Temp 98.1 F (36.7 C) (Oral)   Resp 16   Ht 4\' 11"  (1.499 m)   Wt 67.6 kg   LMP 02/02/2023 (Within Days)   BMI 30.09 kg/m   Constitutional: Well nourished, well developed female in no acute distress.  HEENT: normal Skin: Warm and dry.  Cardiovascular: Regular rate and rhythm.   Extremity:  no edema   Respiratory: Clear to auscultation bilateral. Normal respiratory effort Abdomen: FHT present Back: no CVAT Psych: Alert and Oriented x3. No memory deficits. Normal mood and affect.  MS: normal gait, normal bilateral lower extremity ROM/strength/stability.  Toco: occasional contraction Fetal well being: reactive NST 30 minute tracing, 120 bpm, moderate variability, +accelerations, -decelerations  Consults: None  Significant Findings/ Diagnostic Studies: NA  Procedures: NST  Hospital Course: The patient was admitted to Labor and Delivery Triage for observation.   Discharge Condition: good  Disposition: Discharge disposition: 01-Home or Self Care  Diet: Regular diet  Discharge Activity: Activity as tolerated  Discharge Instructions     Discharge activity:  No Restrictions   Complete by: As directed    Discharge activity:  No Restrictions   Complete by: As directed    Discharge diet:  No restrictions   Complete by: As directed    Discharge diet:  No restrictions   Complete by: As directed       Allergies as of 10/22/2023       Reactions   Covid-19 Mrna Vacc (moderna) Hives   Excedrin Migraine [aspirin-acetaminophen-caffeine] Rash   Shellfish Allergy Anaphylaxis        Medication List     STOP taking these medications    diphenhydrAMINE 25 mg capsule Commonly known as: BENADRYL   escitalopram 20 MG tablet Commonly known as: LEXAPRO   metoCLOPramide 10 MG tablet Commonly known as: REGLAN   ondansetron 4 MG disintegrating tablet Commonly known as: ZOFRAN-ODT   promethazine 25 MG tablet Commonly known as:  PHENERGAN       TAKE these medications    acetaminophen 500 MG tablet Commonly known as: TYLENOL Take 500 mg by mouth every 6 (six) hours as needed.   albuterol 108 (90 Base) MCG/ACT inhaler Commonly known as: VENTOLIN HFA Inhale 2 puffs into the lungs every 6 (  six) hours as needed for wheezing or shortness of breath.   prenatal multivitamin Tabs tablet Take 1 tablet by mouth daily at 12 noon.         Total time spent taking care of this patient: 20 minutes  Signed: Tresea Mall, CNM  10/22/2023, 2:09 PM

## 2023-10-22 NOTE — Progress Notes (Signed)
Attestation of Attending Supervision of Advanced Practice Provider (CNM/NP/PA):  Evaluation, management, and procedures were performed by the Advanced Practice Provider under my supervision and collaboration.  I have reviewed the Advanced Practice Provider's note and chart, and I agree with the management and plan. I have also made any necessary editorial changes.  I was consulted regarding NST concerning for category 2 tracing. I personally reviewed the strip and agree with category 2 given one late decel seen.   Fayette Pho, MD Clinical Services Medical Director [facility] 10/22/23  11:55 AM

## 2023-10-22 NOTE — Progress Notes (Signed)
S: Pt presents as scheduled for NST only due to severe FGR in setting of polysubstance abuse. Feels well, occ uterine contraction, no loss of fluid/bleeding or edema. Good fetal movement. O: Appears well, normal social interaction with staff and providers. NST interpreted as Class II. A: Unreassuring NST at 36 4/7wk. Refer to [facility] L&D for further eval. Is sched for IOL on 10/24/23 at 37 0/7. Pt states understanding of need for L&D eval today and agrees to go via private transportation, plans to contact boyfriend for ride.  Call to [facility] on-call Dr. Hildred Laser at 12:46p (left message on voice mail with brief patient history with plan to expect patient at hosp L&D for evaluation), awaiting reply. Darchelle Nunes PA-C  Addendum: Per review of [facility] L&D discharge summary, pt had reactive NST there and was discharged home with plan to f/u on 10/24/23 for IOL as originally scheduled. Meia Emley PA-C

## 2023-10-22 NOTE — OB Triage Note (Signed)
Patient is a 27 yo, G6P4, at 36 weeks 5 days. Patient presents to L&D after being sent over from the [facility] for a nonreactive NST. Pt reports this pregnancy is complicated by FGR. Patient denies any regular or consistent contractions. Patient denies any vaginal bleeding or LOF. Patient reports +FM. Monitors applied and assessing. VSS. Initial fetal heart tone 120. [place] CNM notified of patients arrival to unit. Plan to place in observation for fetal monitoring.

## 2023-10-23 ENCOUNTER — Other Ambulatory Visit: Payer: Medicaid Other

## 2023-10-23 ENCOUNTER — Ambulatory Visit: Payer: Medicaid Other

## 2023-10-23 LAB — CULTURE, BETA STREP (GROUP B ONLY): Strep Gp B Culture: NEGATIVE

## 2023-10-24 ENCOUNTER — Inpatient Hospital Stay: Payer: Medicaid Other | Admitting: Anesthesiology

## 2023-10-24 ENCOUNTER — Other Ambulatory Visit: Payer: Self-pay

## 2023-10-24 ENCOUNTER — Inpatient Hospital Stay
Admit: 2023-10-24 | Discharge: 2023-10-26 | DRG: 797 | Disposition: A | Discharge status: Home / Self Care | Payer: Medicaid Other

## 2023-10-24 ENCOUNTER — Encounter: Payer: Self-pay | Admitting: Obstetrics

## 2023-10-24 DIAGNOSIS — Z87891 Personal history of nicotine dependence: Secondary | ICD-10-CM

## 2023-10-24 DIAGNOSIS — Z91013 Allergy to seafood: Secondary | ICD-10-CM | POA: Diagnosis not present

## 2023-10-24 DIAGNOSIS — Z801 Family history of malignant neoplasm of trachea, bronchus and lung: Secondary | ICD-10-CM

## 2023-10-24 DIAGNOSIS — J45909 Unspecified asthma, uncomplicated: Secondary | ICD-10-CM | POA: Diagnosis present

## 2023-10-24 DIAGNOSIS — O365930 Maternal care for other known or suspected poor fetal growth, third trimester, not applicable or unspecified: Principal | ICD-10-CM | POA: Diagnosis present

## 2023-10-24 DIAGNOSIS — Z886 Allergy status to analgesic agent status: Secondary | ICD-10-CM

## 2023-10-24 DIAGNOSIS — Z887 Allergy status to serum and vaccine status: Secondary | ICD-10-CM | POA: Diagnosis not present

## 2023-10-24 DIAGNOSIS — O98113 Syphilis complicating pregnancy, third trimester: Secondary | ICD-10-CM

## 2023-10-24 DIAGNOSIS — O99314 Alcohol use complicating childbirth: Secondary | ICD-10-CM

## 2023-10-24 DIAGNOSIS — O99320 Drug use complicating pregnancy, unspecified trimester: Secondary | ICD-10-CM | POA: Diagnosis present

## 2023-10-24 DIAGNOSIS — F1090 Alcohol use, unspecified, uncomplicated: Secondary | ICD-10-CM

## 2023-10-24 DIAGNOSIS — Z8249 Family history of ischemic heart disease and other diseases of the circulatory system: Secondary | ICD-10-CM

## 2023-10-24 DIAGNOSIS — Z3A37 37 weeks gestation of pregnancy: Secondary | ICD-10-CM

## 2023-10-24 DIAGNOSIS — O0990 Supervision of high risk pregnancy, unspecified, unspecified trimester: Secondary | ICD-10-CM

## 2023-10-24 DIAGNOSIS — O9812 Syphilis complicating childbirth: Secondary | ICD-10-CM | POA: Diagnosis present

## 2023-10-24 DIAGNOSIS — O99324 Drug use complicating childbirth: Secondary | ICD-10-CM | POA: Diagnosis present

## 2023-10-24 DIAGNOSIS — Z823 Family history of stroke: Secondary | ICD-10-CM

## 2023-10-24 DIAGNOSIS — O99330 Smoking (tobacco) complicating pregnancy, unspecified trimester: Secondary | ICD-10-CM | POA: Diagnosis present

## 2023-10-24 DIAGNOSIS — Z302 Encounter for sterilization: Secondary | ICD-10-CM

## 2023-10-24 DIAGNOSIS — A539 Syphilis, unspecified: Secondary | ICD-10-CM | POA: Diagnosis present

## 2023-10-24 DIAGNOSIS — O365910 Maternal care for other known or suspected poor fetal growth, first trimester, not applicable or unspecified: Secondary | ICD-10-CM

## 2023-10-24 DIAGNOSIS — O99513 Diseases of the respiratory system complicating pregnancy, third trimester: Secondary | ICD-10-CM | POA: Diagnosis not present

## 2023-10-24 DIAGNOSIS — O9952 Diseases of the respiratory system complicating childbirth: Secondary | ICD-10-CM | POA: Diagnosis present

## 2023-10-24 DIAGNOSIS — Z8041 Family history of malignant neoplasm of ovary: Secondary | ICD-10-CM

## 2023-10-24 DIAGNOSIS — O99310 Alcohol use complicating pregnancy, unspecified trimester: Secondary | ICD-10-CM

## 2023-10-24 DIAGNOSIS — Z8759 Personal history of other complications of pregnancy, childbirth and the puerperium: Secondary | ICD-10-CM

## 2023-10-24 DIAGNOSIS — O09893 Supervision of other high risk pregnancies, third trimester: Secondary | ICD-10-CM | POA: Diagnosis not present

## 2023-10-24 DIAGNOSIS — Z811 Family history of alcohol abuse and dependence: Secondary | ICD-10-CM

## 2023-10-24 DIAGNOSIS — F1490 Cocaine use, unspecified, uncomplicated: Secondary | ICD-10-CM | POA: Diagnosis present

## 2023-10-24 DIAGNOSIS — Z8619 Personal history of other infectious and parasitic diseases: Secondary | ICD-10-CM | POA: Diagnosis present

## 2023-10-24 DIAGNOSIS — Z8659 Personal history of other mental and behavioral disorders: Secondary | ICD-10-CM

## 2023-10-24 DIAGNOSIS — O0993 Supervision of high risk pregnancy, unspecified, third trimester: Secondary | ICD-10-CM | POA: Diagnosis not present

## 2023-10-24 DIAGNOSIS — F1290 Cannabis use, unspecified, uncomplicated: Secondary | ICD-10-CM | POA: Diagnosis present

## 2023-10-24 DIAGNOSIS — O365990 Maternal care for other known or suspected poor fetal growth, unspecified trimester, not applicable or unspecified: Secondary | ICD-10-CM

## 2023-10-24 DIAGNOSIS — O365931 Maternal care for other known or suspected poor fetal growth, third trimester, fetus 1: Secondary | ICD-10-CM | POA: Diagnosis present

## 2023-10-24 LAB — CBC
HCT: 31.4 % — ABNORMAL LOW (ref 36.0–46.0)
Hemoglobin: 10.7 g/dL — ABNORMAL LOW (ref 12.0–15.0)
MCH: 29.4 pg (ref 26.0–34.0)
MCHC: 34.1 g/dL (ref 30.0–36.0)
MCV: 86.3 fL (ref 80.0–100.0)
Platelets: 228 10*3/uL (ref 150–400)
RBC: 3.64 MIL/uL — ABNORMAL LOW (ref 3.87–5.11)
RDW: 12.9 % (ref 11.5–15.5)
WBC: 9.4 10*3/uL (ref 4.0–10.5)
nRBC: 0 % (ref 0.0–0.2)

## 2023-10-24 LAB — TYPE AND SCREEN
ABO/RH(D): B POS
Antibody Screen: NEGATIVE

## 2023-10-24 LAB — URINE DRUG SCREEN, QUALITATIVE (ARMC ONLY)
Amphetamines, Ur Screen: NOT DETECTED
Barbiturates, Ur Screen: NOT DETECTED
Benzodiazepine, Ur Scrn: NOT DETECTED
Cannabinoid 50 Ng, Ur ~~LOC~~: NOT DETECTED
Cocaine Metabolite,Ur ~~LOC~~: NOT DETECTED
MDMA (Ecstasy)Ur Screen: NOT DETECTED
Methadone Scn, Ur: NOT DETECTED
Opiate, Ur Screen: NOT DETECTED
Phencyclidine (PCP) Ur S: NOT DETECTED
Tricyclic, Ur Screen: NOT DETECTED

## 2023-10-24 MED ORDER — PRENATAL MULTIVITAMIN CH
1.0000 | ORAL_TABLET | Freq: Every day | ORAL | Status: DC
  Administered 2023-10-25 – 2023-10-26 (×2): 1 via ORAL
  Filled 2023-10-24 (×2): qty 1

## 2023-10-24 MED ORDER — MISOPROSTOL 25 MCG QUARTER TABLET: 50.0000 ug | ORAL_TABLET | ORAL | Status: DC | PRN

## 2023-10-24 MED ORDER — BENZOCAINE-MENTHOL 20-0.5 % EX AERO
1.0000 | INHALATION_SPRAY | CUTANEOUS | Status: DC | PRN
  Administered 2023-10-24: 1 via TOPICAL
  Filled 2023-10-24: qty 56

## 2023-10-24 MED ORDER — BUPIVACAINE HCL (PF) 0.25 % IJ SOLN
INTRAMUSCULAR | Status: DC | PRN
  Administered 2023-10-24 (×2): 5 mL via EPIDURAL

## 2023-10-24 MED ORDER — DIPHENHYDRAMINE HCL 50 MG/ML IJ SOLN
12.5000 mg | INTRAMUSCULAR | Status: DC | PRN
Start: 2023-10-24 — End: 2023-10-24

## 2023-10-24 MED ORDER — PHENYLEPHRINE 80 MCG/ML (10ML) SYRINGE FOR IV PUSH (FOR BLOOD PRESSURE SUPPORT): 80.0000 ug | PREFILLED_SYRINGE | INTRAVENOUS | Status: DC | PRN

## 2023-10-24 MED ORDER — LACTATED RINGERS IV SOLN: INTRAVENOUS | Status: DC

## 2023-10-24 MED ORDER — OXYTOCIN 10 UNIT/ML IJ SOLN
INTRAMUSCULAR | Status: AC
  Filled 2023-10-24: qty 2

## 2023-10-24 MED ORDER — EPHEDRINE 5 MG/ML INJ: 10.0000 mg | INTRAVENOUS | Status: DC | PRN

## 2023-10-24 MED ORDER — LIDOCAINE HCL (PF) 1 % IJ SOLN: 30.0000 mL | INTRAMUSCULAR | Status: DC | PRN

## 2023-10-24 MED ORDER — LACTATED RINGERS IV SOLN
500.0000 mL | Freq: Once | INTRAVENOUS | Status: AC
  Administered 2023-10-24: 500 mL via INTRAVENOUS

## 2023-10-24 MED ORDER — AMMONIA AROMATIC IN INHA
RESPIRATORY_TRACT | Status: AC
  Filled 2023-10-24: qty 10

## 2023-10-24 MED ORDER — TERBUTALINE SULFATE 1 MG/ML IJ SOLN: 0.2500 mg | Freq: Once | INTRAMUSCULAR | Status: DC | PRN

## 2023-10-24 MED ORDER — DOCUSATE SODIUM 100 MG PO CAPS
100.0000 mg | ORAL_CAPSULE | Freq: Two times a day (BID) | ORAL | Status: DC
  Administered 2023-10-24 – 2023-10-26 (×3): 100 mg via ORAL
  Filled 2023-10-24 (×4): qty 1

## 2023-10-24 MED ORDER — OXYTOCIN-SODIUM CHLORIDE 30-0.9 UT/500ML-% IV SOLN: 2.5000 [IU]/h | INTRAVENOUS | Status: DC | PRN

## 2023-10-24 MED ORDER — SIMETHICONE 80 MG PO CHEW: 80.0000 mg | CHEWABLE_TABLET | ORAL | Status: DC | PRN

## 2023-10-24 MED ORDER — LIDOCAINE HCL (PF) 1 % IJ SOLN
INTRAMUSCULAR | Status: AC
  Filled 2023-10-24: qty 30

## 2023-10-24 MED ORDER — IBUPROFEN 600 MG PO TABS
600.0000 mg | ORAL_TABLET | Freq: Four times a day (QID) | ORAL | Status: DC
  Administered 2023-10-24 – 2023-10-26 (×7): 600 mg via ORAL
  Filled 2023-10-24 (×8): qty 1

## 2023-10-24 MED ORDER — LACTATED RINGERS IV SOLN
500.0000 mL | INTRAVENOUS | Status: DC | PRN
  Administered 2023-10-24: 500 mL via INTRAVENOUS

## 2023-10-24 MED ORDER — OXYTOCIN-SODIUM CHLORIDE 30-0.9 UT/500ML-% IV SOLN
1.0000 m[IU]/min | INTRAVENOUS | Status: DC
  Administered 2023-10-24: 2 m[IU]/min via INTRAVENOUS

## 2023-10-24 MED ORDER — OXYTOCIN-SODIUM CHLORIDE 30-0.9 UT/500ML-% IV SOLN: 2.5000 [IU]/h | INTRAVENOUS | Status: DC

## 2023-10-24 MED ORDER — OXYTOCIN BOLUS FROM INFUSION
333.0000 mL | Freq: Once | INTRAVENOUS | Status: AC
  Administered 2023-10-24: 333 mL via INTRAVENOUS

## 2023-10-24 MED ORDER — LIDOCAINE-EPINEPHRINE (PF) 1.5 %-1:200000 IJ SOLN
INTRAMUSCULAR | Status: DC | PRN
  Administered 2023-10-24: 3 mL via PERINEURAL

## 2023-10-24 MED ORDER — OXYTOCIN-SODIUM CHLORIDE 30-0.9 UT/500ML-% IV SOLN
INTRAVENOUS | Status: AC
  Filled 2023-10-24: qty 500

## 2023-10-24 MED ORDER — PENICILLIN G BENZATHINE 1200000 UNIT/2ML IM SUSY
2.4000 10*6.[IU] | PREFILLED_SYRINGE | Freq: Once | INTRAMUSCULAR | Status: DC
  Filled 2023-10-24: qty 4

## 2023-10-24 MED ORDER — TERBUTALINE SULFATE 1 MG/ML IJ SOLN
INTRAMUSCULAR | Status: AC
  Filled 2023-10-24: qty 1

## 2023-10-24 MED ORDER — DIPHENHYDRAMINE HCL 25 MG PO CAPS: 25.0000 mg | ORAL_CAPSULE | Freq: Four times a day (QID) | ORAL | Status: DC | PRN

## 2023-10-24 MED ORDER — FENTANYL CITRATE (PF) 100 MCG/2ML IJ SOLN
100.0000 ug | INTRAMUSCULAR | Status: DC | PRN
  Administered 2023-10-24 (×2): 100 ug via INTRAVENOUS
  Filled 2023-10-24 (×2): qty 2

## 2023-10-24 MED ORDER — LIDOCAINE HCL (PF) 1 % IJ SOLN
INTRAMUSCULAR | Status: DC | PRN
  Administered 2023-10-24: 3 mL

## 2023-10-24 MED ORDER — FENTANYL-BUPIVACAINE-NACL 0.5-0.125-0.9 MG/250ML-% EP SOLN
EPIDURAL | Status: AC
  Filled 2023-10-24: qty 250

## 2023-10-24 MED ORDER — ACETAMINOPHEN 325 MG PO TABS
650.0000 mg | ORAL_TABLET | ORAL | Status: DC | PRN
  Administered 2023-10-24 – 2023-10-26 (×4): 650 mg via ORAL
  Filled 2023-10-24 (×4): qty 2

## 2023-10-24 MED ORDER — ONDANSETRON HCL 4 MG/2ML IJ SOLN: 4.0000 mg | Freq: Four times a day (QID) | INTRAMUSCULAR | Status: DC | PRN

## 2023-10-24 MED ORDER — MISOPROSTOL 200 MCG PO TABS
ORAL_TABLET | ORAL | Status: AC
  Filled 2023-10-24: qty 4

## 2023-10-24 MED ORDER — FENTANYL-BUPIVACAINE-NACL 0.5-0.125-0.9 MG/250ML-% EP SOLN
12.0000 mL/h | EPIDURAL | Status: DC | PRN
  Administered 2023-10-24: 12 mL/h via EPIDURAL

## 2023-10-24 NOTE — Progress Notes (Signed)
Labor Progress Note   ASSESSMENT/PLAN   Nancy Ball 27 y.o.   [id number]  at [redacted]w[redacted]d here with induction for fetal growth restriction.  FWB:  - Fetal well being assessed:  Cat II, variables with contractions, patient repositioned to left side     GBS: - GBS negative  LABOR: - Now in active labor, doing well. - Pain Management: epidural in place - AROM to clear fluid with patient informed consent. Discussed options with patient and will continue with pitocin titration per policy. - Anticipate SVD  Labor Progress 0940  2/50/-3, Cook catheter placed 1415  Cook catheter removed with gentle tension 1500  7/100/-1, AROM (clr)  SUBJECTIVE/OBJECTIVE   SUBJECTIVE:  Comfortable with epidural   OBJECTIVE: Vital Signs: Patient Vitals for the past 12 hrs:  BP Temp Temp src Pulse Resp SpO2 Height Weight  10/24/23 1500 118/70 -- -- 72 -- 99 % -- --  10/24/23 1459 -- 98.4 F (36.9 C) Oral -- -- -- -- --  10/24/23 1455 117/74 -- -- 67 17 -- -- --  10/24/23 1447 129/78 -- -- 71 -- 100 % -- --  10/24/23 1425 114/70 -- -- 69 -- 100 % -- --  10/24/23 1405 -- -- -- -- -- 100 % -- --  10/24/23 1345 -- -- -- -- -- 100 % -- --  10/24/23 1130 -- -- -- -- -- 99 % -- --  10/24/23 1115 -- -- -- -- -- 99 % -- --  10/24/23 1105 (!) 108/57 -- -- 67 -- 98 % -- --  10/24/23 1104 -- 97.9 F (36.6 C) Oral -- 16 -- -- --  10/24/23 0850 117/64 98.3 F (36.8 C) Oral 78 17 98 % 4\' 11"  (1.499 m) 67.6 kg    Last SVE:  Dilation: 7 Effacement (%): 100 Cervical Position: Posterior Station: -1 Presentation: Vertex (per Le[place]olds) Exam by:: S. Tvisha Schwoerer CNM -  , Rupture Date: 10/24/23, Rupture Time: 1505,    FHR:   - Mode: External (Epidural placement completed, EFM resumed)  - Baseline Rate (A): 115 bpm  -    - Characteristics (ie - accels, decels): Accelerations: 15 x 15  -    UTERINE ACTIVITY:   - Mode: Toco  - Contraction Frequency (min): 3 minutes Pitocin: 8 mU/min

## 2023-10-24 NOTE — Anesthesia Procedure Notes (Signed)
Epidural Patient location during procedure: OB Start time: 10/24/2023 2:42 PM End time: 10/24/2023 2:44 PM  Staffing Anesthesiologist: Louie Boston, MD Performed: anesthesiologist   Preanesthetic Checklist Completed: patient identified, IV checked, site marked, risks and benefits discussed, surgical consent, monitors and equipment checked, pre-op evaluation and timeout performed  Epidural Patient position: sitting Prep: ChloraPrep Patient monitoring: heart rate, continuous pulse ox and blood pressure Approach: midline Location: L3-L4 Injection technique: LOR saline  Needle:  Needle type: Tuohy  Needle gauge: 17 G Needle length: 9 cm and 9 Needle insertion depth: 6.5 cm Catheter type: closed end flexible Catheter size: 19 Gauge Catheter at skin depth: 12 cm Test dose: negative and 1.5% lidocaine with Epi 1:200 K  Assessment Sensory level: T10 Events: blood not aspirated, injection not painful, no injection resistance, no paresthesia and negative IV test  Additional Notes 1 attempt Pt. Evaluated and documentation done after procedure finished. Patient identified. Risks/Benefits/Options discussed with patient including but not limited to bleeding, infection, nerve damage, paralysis, failed block, incomplete pain control, headache, blood pressure changes, nausea, vomiting, reactions to medication both or allergic, itching and postpartum back pain. Confirmed with bedside nurse the patient's most recent platelet count. Confirmed with patient that they are not currently taking any anticoagulation, have any bleeding history or any family history of bleeding disorders. Patient expressed understanding and wished to proceed. All questions were answered. Sterile technique was used throughout the entire procedure. Please see nursing notes for vital signs. Test dose was given through epidural catheter and negative prior to continuing to dose epidural or start infusion. Warning signs of high  block given to the patient including shortness of breath, tingling/numbness in hands, complete motor block, or any concerning symptoms with instructions to call for help. Patient was given instructions on fall risk and not to get out of bed. All questions and concerns addressed with instructions to call with any issues or inadequate analgesia.    Patient tolerated the insertion well without immediate complications. Reason for block:procedure for pain

## 2023-10-24 NOTE — H&P (Addendum)
[facility] Labor & Delivery  History and Physical  ASSESSMENT AND PLAN   Nancy Ball is a 27 y.o. [id number] at [redacted]w[redacted]d with EDD: 11/14/2023, by 12 w4d Ultrasound admitted for induction of labor due to fetal growth restriction.  Induction Plan: - Cook catheter with 80/80 ml saline placed for cervical ripening. Will start pitocin with placement of Cook catheter and consider AROM as appropriate once Cook catheter has been expelled.  - Plans epidural for pain management.   Pregnancy complications: Fetal Growth Restriction - Last growth ultrasound and BPP with UA dopplers on 09/03/23: EFW 1140g, 2.7%, AFI 14.15, dopplers normal, BPP 8/8 - Cat II tracing in clinic on 10/31, Cat I during extended monitoring in L&D triage.   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 - On 08/18/23, titers remained 1:8 and first dose of 2.31mU benzathine penicillin G given. Patient did not follow up for additional required treatment doses.  - Treatment restarted on 10/12/23, second dose on 10/19/23, third dose due 11/4, will give prior to discharge from hospital.   Polysubstance use - Endorses cocaine, marijuana, tobacco, and alcohol use during pregnancy.  - UDS on 10/12/23 positive for cocaine and THC.  - UDS sent on admission to L&D.  Asthma - Albuterol inhaler PRN. - Avoid use of hemabate for [facility] management.   History of vacuum-assisted delivery - With G4, 3420g  History of Preeclampsia - Normotensive throughout this pregnancy and on admission to L&D. - Prescribed daily LDASA  Fetal Status: - cephalic presentation by Leopold's - EFW: 5.5 lbs by Leopold's - Continuous fetal monitoring - FHT currently cat II  Labs/Immunizations: Prenatal labs and studies: ABO, Rh: --/--/PENDING (11/02 8413) Antibody: PENDING (11/02 0903) Rubella: 13.70  (05/09 1131) Varicella:    RPR: Reactive (08/27 1207)  HBsAg: Negative (05/09 1131)  HepC: Non Reactive (05/09 1131) HIV: Non Reactive (05/09 1131)  GBS: Negative/-- (10/28 1114)   TDAP: 08/18/23 Flu: 10/12/23 RSV: No  Postpartum Plan: - Feeding: Breast Milk and Formula - Contraception: plans: Desires BTL, Medicaid consent signed 08/04/23, in media tab - Prenatal Care Provider: [facility]     HPI   Chief Complaint: Induction of labor  Nancy Ball is a 27 y.o. [id number] at [redacted]w[redacted]d who presents for induction of labor due to fetal growth restriction.    Patient denies contractions, LOF, vaginal bleeding.  Pregnancy Complications Patient Active Problem List   Diagnosis Date Noted   IUGR (intrauterine growth restriction) affecting care of mother, third trimester, fetus 1 10/24/2023   Cocaine use complicating pregnancy 10/19/2023   Marijuana use during pregnancy 10/19/2023   Tobacco smoking affecting pregnancy 10/19/2023   Noncompliant pregnant patient (no prenatal care x 9 1/2 wks) 10/12/2023   Alcohol use affecting pregnancy; last use 09/26/23 (1 glass wine) 10/12/2023   Fetal growth restriction antepartum, severe 08/20/2023   Asthma affecting pregnancy, antepartum 05/28/2023   History of syphilis 05/07/2023   Supervision of high-risk pregnancy, unspecified trimester 04/30/2023   History of pp depression x 4 08/06/2017   History of pre-eclampsia 02/23/2017    Review of Systems A twelve point review of systems was negative except as stated in HPI.   HISTORY   Medications Medications Prior to Admission  Medication Sig Dispense Refill Last Dose   acetaminophen (TYLENOL) 500 MG tablet Take 500 mg by mouth every 6 (six) hours  as needed.   Past Week   albuterol (VENTOLIN HFA) 108 (90 Base) MCG/ACT inhaler Inhale 2 puffs into the lungs every 6 (six) hours as needed for wheezing or shortness of breath. 8 g 2 Past Month   Prenatal Vit-Fe Fumarate-FA (PRENATAL MULTIVITAMIN) TABS tablet  Take 1 tablet by mouth daily at 12 noon.   10/23/2023    Allergies is allergic to covid-19 mrna vacc (moderna), excedrin migraine [aspirin-acetaminophen-caffeine], and shellfish allergy.   OB History OB History  Gravida Para Term Preterm AB Living  6 4 4  0 1 4  SAB IAB Ectopic Multiple Live Births  1 0 0 0 4    # Outcome Date GA Lbr Len/2nd Weight Sex Type Anes PTL Lv  6 Current           5 SAB 06/15/21 [redacted]w[redacted]d            Birth Comments: Evaluation at [facility]  4 Term 09/23/18 [redacted]w[redacted]d 01:35 / 00:09 3420 g M Vag-Vacuum EPI  LIV     Name: Ramakrishnan,BOY Lunna     Apgar1: 8  Apgar5: 9  3 Term 07/29/17 [redacted]w[redacted]d  3450 g M Vag-Spont EPI  LIV     Name: ROSS,BOY Davionne     Apgar1: 8  Apgar5: 9  2 Term 08/14/16 [redacted]w[redacted]d  3350 g F Vag-Spont EPI  LIV     Name: ROSS,GIRL Chealsey     Apgar1: 8  Apgar5: 8  1 Term 07/25/14 [redacted]w[redacted]d   F Vag-Spont EPI  LIV     Complications: Low iron    Obstetric Comments  States low iron with all pregnancies.    Past Medical History Past Medical History:  Diagnosis Date   Anemia complicating pregnancy, third trimester 06/03/2017   Anemia during pregnancy    Reports with all pregnancies   Arthritis    juvenile, stable since 2019, was taking Meloxicam / cortisone injections   Asthma    last SOB episode early 2024   Depression    PP depression - reports with all pregnancies   History of spontaneous abortion 05/2021   History of syphilis 05/2021   Reports diagnosed at [facility]. Reports received 2 injections one time.   Hypertension 07/2016   Post-partum pre-eclampsia   Indication for care in labor and delivery, antepartum 08/17/2023   Lupus    Reports told by MD in 2015 that had all the signs / symptoms of Lupus, never officially diagnosed per client.   Postpartum depression    Reports with all pregnancies.   Pregnancy 07/09/2018   Tobacco abuse  10-3 cpd 04/30/2023   Quit 07/16/23      Past Surgical History Past Surgical History:  Procedure Laterality Date   DILATION  AND EVACUATION     06/15/2021 at [facility] TOOTH EXTRACTION Bilateral 2013   states she was put to sleep    Social History  reports that she has quit smoking. Her smoking use included cigarettes. She has a 8 pack-year smoking history. She has been exposed to tobacco smoke. She has never used smokeless tobacco. She reports that she does not currently use alcohol. She reports that she does not currently use drugs after having used the following drugs: Marijuana.   Family History family history includes Alcohol abuse in her father; Breast cancer in her paternal aunt; CVA in her father; Drug abuse in her mother; Healthy in her daughter, daughter, half-brother, half-brother, half-brother, son, and son; Heart attack in her father; Hypertension in her father;  Lung cancer in her paternal grandfather; Ovarian cancer in her paternal grandmother; Seizures in her paternal aunt and paternal aunt; Ulcers in her father.   PHYSICAL EXAM   Vitals:   10/24/23 0850  BP: 117/64  Pulse: 78  Resp: 17  Temp: 98.3 F (36.8 C)  TempSrc: Oral  SpO2: 98%  Weight: 67.6 kg  Height: 4\' 11"  (1.499 m)    Constitutional: No acute distress, well appearing, and well nourished. Neurologic: She is alert and conversational.  Psychiatric: She has a normal mood and affect.  Musculoskeletal: Normal gait, grossly normal range of motion Cardiovascular: Normal rate.   Pulmonary/Chest: Normal work of breathing.  Gastrointestinal/Abdominal: Soft. Gravid. There is no tenderness.  Skin: Skin is warm and dry. No rash noted.  Genitourinary: Normal external female genitalia.  SVE:   Dilation: 2 Effacement (%): 50 Cervical Position: Posterior Station: -3 Presentation: Vertex (per Leopolds) Exam by:: S. Marvia Troost CNM SSE: deferred  NST Interpretation Indication: Induction Baseline: 125 bpm Variability: moderate Accelerations: present Decelerations: late deceleration with single contraction Contractions: irregular Time  noted:  See OBIX Impression: Cat II tracing, will continue to monitor Authenticated by: Lindalou Hose Aarsh Fristoe   Attending Dr. Lonny Prude was immediately available for the care of the patient.   Lindalou Hose Jameson Tormey, CNM

## 2023-10-24 NOTE — Lactation Note (Signed)
This note was copied from a baby's chart. Lactation Consultation Note  Patient Name: Nancy Ball Today's Date: 10/24/2023 Age:27 hours Reason for consult: L&D Initial assessment;1st time breastfeeding;Early term 37-38.6wks;Infant < 6lbs   Maternal Data Has patient been taught Hand Expression?: Yes Does the patient have breastfeeding experience prior to this delivery?: Yes How long did the patient breastfeed?: 2 mths with first child, less with others  Feeding Mother's Current Feeding Choice: Breast Milk and Formula Nipple Type: Slow - flow Assisted with latching baby to breast after hand expression, few drops, baby in cradle hold at left breast, few attempts to latch as baby sucking on tongue and not opening wide, once latched baby nursed x 18 min with some swallows noted.  Formula offered post feed due to baby's wt at 5-4, mom educated on options to increase supply if she decides to continue to breastfeed     LATCH Score Latch: Grasps breast easily, tongue down, lips flanged, rhythmical sucking.  Audible Swallowing: A few with stimulation  Type of Nipple: Everted at rest and after stimulation  Comfort (Breast/Nipple): Soft / non-tender  Hold (Positioning): Assistance needed to correctly position infant at breast and maintain latch.  LATCH Score: 8   Lactation Tools Discussed/Used Tools: Bottle  Interventions Interventions: Breast feeding basics reviewed;Assisted with latch;Skin to skin;Hand express;Support pillows;Education  Discharge Pump:  (mom unsure if she wants to pump) WIC Program: Yes  Consult Status Consult Status: Follow-up from L&D Date: 10/25/23 Follow-up type: In-patient    Dyann Kief 10/24/2023, 6:47 PM

## 2023-10-24 NOTE — Progress Notes (Addendum)
Labor Progress Note   ASSESSMENT/PLAN   Nancy Ball 27 y.o.   [id number]  at [redacted]w[redacted]d here with induction for fetal growth restriction.  FWB:  - Fetal well being assessed: Cat I GBS: - GBS negative  LABOR: - Now awaiting cervical ripening, doing well. - Pain Management: IV pain medication - Cook catheter remains in place. Discussed options with patient and will continue to titrate pitocin.  - Anticipate SVD   Labor Progress 0940  2/50/-3, Cook catheter placed  SUBJECTIVE/OBJECTIVE   SUBJECTIVE:  Feeling contractions with pitocin. Was feeling more pain but was relieved after voiding.   OBJECTIVE: Vital Signs: Patient Vitals for the past 12 hrs:  BP Temp Temp src Pulse Resp SpO2 Height Weight  10/24/23 1130 -- -- -- -- -- 99 % -- --  10/24/23 1115 -- -- -- -- -- 99 % -- --  10/24/23 1105 (!) 108/57 -- -- 67 -- 98 % -- --  10/24/23 1104 -- 97.9 F (36.6 C) Oral -- 16 -- -- --  10/24/23 0850 117/64 98.3 F (36.8 C) Oral 78 17 98 % 4\' 11"  (1.499 m) 67.6 kg    Last SVE:  Dilation: 2 Effacement (%): 50 Cervical Position: Posterior Station: -3 Presentation: Vertex (per Leopolds) Exam by:: S. Vicke Plotner CNM -  ,  ,  ,    FHR:   - Mode: External  - Baseline Rate (A): 120 bpm (fht)  -    - Characteristics (ie - accels, decels): Accelerations: 15 x 15  -    UTERINE ACTIVITY:   - Mode: Toco  - Contraction Frequency (min): 1-3 minutes Pitocin: 8 mU/min

## 2023-10-24 NOTE — Anesthesia Preprocedure Evaluation (Signed)
Anesthesia Evaluation  Patient identified by MRN, date of birth, ID band Patient awake    Reviewed: Allergy & Precautions, NPO status , Patient's Chart, lab work & pertinent test results  History of Anesthesia Complications Negative for: history of anesthetic complications  Airway Mallampati: III  TM Distance: >3 FB Neck ROM: full    Dental no notable dental hx.    Pulmonary asthma , Current Smoker   Pulmonary exam normal        Cardiovascular Exercise Tolerance: Good hypertension, negative cardio ROS Normal cardiovascular exam     Neuro/Psych  PSYCHIATRIC DISORDERS  Depression       GI/Hepatic negative GI ROS,,,(+)     substance abuse  alcohol use, cocaine use and marijuana use  Endo/Other    Renal/GU   negative genitourinary   Musculoskeletal  (+) Arthritis ,    Abdominal   Peds  Hematology  (+) Blood dyscrasia, anemia Syphilis, received last dose of treatment on admission    Anesthesia Other Findings Past Medical History: 06/03/2017: Anemia complicating pregnancy, third trimester No date: Anemia during pregnancy     Comment:  Reports with all pregnancies No date: Arthritis     Comment:  juvenile, stable since 2019, was taking Meloxicam /               cortisone injections No date: Asthma     Comment:  last SOB episode early 2024 No date: Depression     Comment:  PP depression - reports with all pregnancies 05/2021: History of spontaneous abortion 05/2021: History of syphilis     Comment:  Reports diagnosed at [facility]. Reports received 2 injections               one time. 07/2016: Hypertension     Comment:  Post-partum pre-eclampsia 08/17/2023: Indication for care in labor and delivery, antepartum No date: Lupus     Comment:  Reports told by MD in 2015 that had all the signs /               symptoms of Lupus, never officially diagnosed per client. No date: Postpartum depression     Comment:  Reports  with all pregnancies. 07/09/2018: Pregnancy 04/30/2023: Tobacco abuse  10-3 cpd     Comment:  Quit 07/16/23    Past Surgical History: No date: DILATION AND EVACUATION     Comment:  06/15/2021 at [facility] 2013: WISDOM TOOTH EXTRACTION; Bilateral     Comment:  states she was put to sleep  BMI    Body Mass Index: 30.09 kg/m      Reproductive/Obstetrics (+) Pregnancy                             Anesthesia Physical Anesthesia Plan  ASA: 3  Anesthesia Plan: Epidural   Post-op Pain Management:    Induction:   PONV Risk Score and Plan:   Airway Management Planned: Natural Airway  Additional Equipment:   Intra-op Plan:   Post-operative Plan:   Informed Consent: I have reviewed the patients History and Physical, chart, labs and discussed the procedure including the risks, benefits and alternatives for the proposed anesthesia with the patient or authorized representative who has indicated his/her understanding and acceptance.     Dental Advisory Given  Plan Discussed with: Anesthesiologist  Anesthesia Plan Comments: (Patient reports no bleeding problems and no anticoagulant use.   Patient consented for risks of anesthesia including but not limited to:  -  adverse reactions to medications - risk of bleeding, infection and or nerve damage from epidural that could lead to paralysis - risk of headache or failed epidural - nerve damage due to positioning - that if epidural is used for C-section that there is a chance of epidural failure requiring spinal placement or conversion to GA - Damage to heart, brain, lungs, other parts of body or loss of life  Patient voiced understanding and assent.)        Anesthesia Quick Evaluation

## 2023-10-24 NOTE — Discharge Summary (Signed)
Postpartum Discharge Summary  Date of Service updated***     Patient Name: Nancy Ball DOB: 1996/03/09 MRN: [id number]  Date of admission: 10/24/2023 Delivery date:10/24/2023 Delivering provider: FREE, Lindalou Hose Date of discharge: 10/24/2023  Admitting diagnosis: IUGR (intrauterine growth restriction) affecting care of mother, third trimester, fetus 1 [O36.5931] Intrauterine pregnancy: [redacted]w[redacted]d     Secondary diagnosis:  Principal Problem:   IUGR (intrauterine growth restriction) affecting care of mother, third trimester, fetus 1 Active Problems:   History of pre-eclampsia   History of pp depression x 4   Supervision of high-risk pregnancy, unspecified trimester   History of syphilis   Asthma affecting pregnancy, antepartum   Alcohol use affecting pregnancy; last use 09/26/23 (1 glass wine)   Cocaine use complicating pregnancy   Marijuana use during pregnancy   Tobacco smoking affecting pregnancy    Discharge diagnosis: Term Pregnancy Delivered and FGR                                               Post partum procedures:postpartum tubal ligation Augmentation: AROM, Pitocin, and IP Foley Complications: None  Hospital course: Induction of Labor With Vaginal Delivery   27 y.o. yo [id number] at [redacted]w[redacted]d was admitted to the hospital 10/24/2023 for induction of labor.  Indication for induction:  fetal growth restriction .  Patient had an uncomplicated labor course. Membrane Rupture Time/Date: 3:05 PM,10/24/2023  Delivery Method:Vaginal, Spontaneous Operative Delivery:N/A Episiotomy: None Lacerations:  None Details of delivery can be found in separate delivery note.  Patient had a postpartum course complicated by***. Patient is discharged home 10/24/23.  Newborn Data: Birth date:10/24/2023 Birth time:4:13 PM Gender:Female Living status:Living Apgars:8 ,9  Weight:2420 g  Magnesium Sulfate received: No BMZ received: No Rhophylac:N/A MMR:No T-DaP:Given prenatally Flu: Yes RSV Vaccine  received: No Transfusion:No Immunizations administered: Immunization History  Administered Date(s) Administered   Influenza, Seasonal, Injecte, Preservative Fre 04/30/2023, 10/12/2023   Tdap 05/19/2017, 08/18/2023    Physical exam  Vitals:   10/24/23 1624 10/24/23 1625 10/24/23 1626 10/24/23 1630  BP: 116/76   109/72  Pulse: 71   77  Resp:   16   Temp:    97.7 F (36.5 C)  TempSrc:    Oral  SpO2:  100%    Weight:      Height:       General: {Exam; general:21111117} Lochia: {Desc; appropriate/inappropriate:30686::"appropriate"} Uterine Fundus: {Desc; firm/soft:30687} Incision: {Exam; incision:21111123} DVT Evaluation: {Exam; dvt:2111122} Labs: Lab Results  Component Value Date   WBC 9.4 10/24/2023   HGB 10.7 (L) 10/24/2023   HCT 31.4 (L) 10/24/2023   MCV 86.3 10/24/2023   PLT 228 10/24/2023      Latest Ref Rng & Units 05/06/2023    5:45 PM  CMP  Glucose 70 - 99 mg/dL 82   BUN 6 - 20 mg/dL 9   Creatinine 1.47 - 8.29 mg/dL 5.62   Sodium 130 - 865 mmol/L 135   Potassium 3.5 - 5.1 mmol/L 3.2   Chloride 98 - 111 mmol/L 107   CO2 22 - 32 mmol/L 19   Calcium 8.9 - 10.3 mg/dL 8.9    Edinburgh Score:    09/24/2018   11:47 AM  Edinburgh Postnatal Depression Scale Screening Tool  I have been able to laugh and see the funny side of things. 1  I have looked forward with enjoyment to  things. 0  I have blamed myself unnecessarily when things went wrong. 1  I have been anxious or worried for no good reason. 2  I have felt scared or panicky for no good reason. 1  Things have been getting on top of me. 1  I have been so unhappy that I have had difficulty sleeping. 2  I have felt sad or miserable. 1  I have been so unhappy that I have been crying. 1  The thought of harming myself has occurred to me. 0  Edinburgh Postnatal Depression Scale Total 10      After visit meds:  Allergies as of 10/24/2023       Reactions   Covid-19 Mrna Vacc (moderna) Hives   Excedrin  Migraine [aspirin-acetaminophen-caffeine] Rash   Shellfish Allergy Anaphylaxis     Med Rec must be completed prior to using this SMARTLINK***        Discharge home in stable condition Infant Feeding: {Baby feeding:23562} Infant Disposition:{CHL IP OB HOME WITH KVQQVZ:56387} Discharge instruction: per After Visit Summary and Postpartum booklet. Activity: Advance as tolerated. Pelvic rest for 6 weeks.  Diet: {OB diet:21111121} Anticipated Birth Control: BTL done PP Postpartum Appointment:{Outpatient follow up:23559} Additional Postpartum F/U: {PP Procedure:23957} Future Appointments:No future appointments. Follow up Visit:      10/24/2023 Lindalou Hose Free, CNM

## 2023-10-24 NOTE — Progress Notes (Signed)
Labor Progress Note   ASSESSMENT/PLAN   Nancy Ball 27 y.o.   [id number]  at [redacted]w[redacted]d here with induction of labor for fetal growth restriction.  FWB:  - Fetal well being assessed: Cat I       GBS: - GBS negative  LABOR: - Now with cervical ripening, awaiting active labor, doing well. - Pain Management: requesting epidural - Discussed options with patient and will plan for SVE and AROM after comfortable with epidural. Will continue to titrate pitocin.  - Anticipate SVD   Labor Progress 0940  2/50/-3, Cook catheter placed 1415  Cook catheter removed with gentle tension   SUBJECTIVE/OBJECTIVE   SUBJECTIVE:    Feeling more uncomfortable and desires additional pain management with epidural.   OBJECTIVE: Vital Signs: Patient Vitals for the past 12 hrs:  BP Temp Temp src Pulse Resp SpO2 Height Weight  10/24/23 1405 -- -- -- -- -- 100 % -- --  10/24/23 1345 -- -- -- -- -- 100 % -- --  10/24/23 1130 -- -- -- -- -- 99 % -- --  10/24/23 1115 -- -- -- -- -- 99 % -- --  10/24/23 1105 (!) 108/57 -- -- 67 -- 98 % -- --  10/24/23 1104 -- 97.9 F (36.6 C) Oral -- 16 -- -- --  10/24/23 0850 117/64 98.3 F (36.8 C) Oral 78 17 98 % 4\' 11"  (1.499 m) 67.6 kg    Last SVE:  Dilation: 2 Effacement (%): 50 Cervical Position: Posterior Station: -3 Presentation: Vertex (per Leopolds) Exam by:: S. Arely Tinner CNM -  ,  ,  ,    FHR:   - Mode: External  - Baseline Rate (A): 115 bpm  -    - Characteristics (ie - accels, decels): Accelerations: 15 x 15  -    UTERINE ACTIVITY:   - Mode: Toco  - Contraction Frequency (min): 3-4 minutes Pitocin: 10 mU/min

## 2023-10-25 ENCOUNTER — Inpatient Hospital Stay: Payer: Medicaid Other | Admitting: Registered Nurse

## 2023-10-25 ENCOUNTER — Other Ambulatory Visit: Payer: Self-pay

## 2023-10-25 ENCOUNTER — Encounter: Payer: Self-pay | Admitting: Obstetrics

## 2023-10-25 ENCOUNTER — Encounter: Disposition: A | Discharge status: Home / Self Care | Payer: Self-pay | Source: Home / Self Care

## 2023-10-25 DIAGNOSIS — Z302 Encounter for sterilization: Secondary | ICD-10-CM

## 2023-10-25 HISTORY — PX: TUBAL LIGATION: SHX77

## 2023-10-25 LAB — CBC
HCT: 29.6 % — ABNORMAL LOW (ref 36.0–46.0)
Hemoglobin: 10.4 g/dL — ABNORMAL LOW (ref 12.0–15.0)
MCH: 29.6 pg (ref 26.0–34.0)
MCHC: 35.1 g/dL (ref 30.0–36.0)
MCV: 84.3 fL (ref 80.0–100.0)
Platelets: 204 10*3/uL (ref 150–400)
RBC: 3.51 MIL/uL — ABNORMAL LOW (ref 3.87–5.11)
RDW: 13.1 % (ref 11.5–15.5)
WBC: 10.3 10*3/uL (ref 4.0–10.5)
nRBC: 0 % (ref 0.0–0.2)

## 2023-10-25 LAB — RPR
RPR Ser Ql: REACTIVE — AB
RPR Titer: 1:8 {titer}

## 2023-10-25 SURGERY — LIGATION, FALLOPIAN TUBE, POSTPARTUM
Anesthesia: General | Site: Abdomen | Laterality: Bilateral

## 2023-10-25 MED ORDER — FENTANYL CITRATE (PF) 100 MCG/2ML IJ SOLN
25.0000 ug | INTRAMUSCULAR | Status: DC | PRN
  Administered 2023-10-25 (×2): 25 ug via INTRAVENOUS

## 2023-10-25 MED ORDER — DROPERIDOL 2.5 MG/ML IJ SOLN: 0.6250 mg | Freq: Once | INTRAMUSCULAR | Status: DC | PRN

## 2023-10-25 MED ORDER — FENTANYL CITRATE (PF) 100 MCG/2ML IJ SOLN
INTRAMUSCULAR | Status: DC | PRN
  Administered 2023-10-25 (×2): 50 ug via INTRAVENOUS

## 2023-10-25 MED ORDER — ROCURONIUM BROMIDE 10 MG/ML (PF) SYRINGE
PREFILLED_SYRINGE | INTRAVENOUS | Status: AC
  Filled 2023-10-25: qty 10

## 2023-10-25 MED ORDER — PHENYLEPHRINE 80 MCG/ML (10ML) SYRINGE FOR IV PUSH (FOR BLOOD PRESSURE SUPPORT)
PREFILLED_SYRINGE | INTRAVENOUS | Status: AC
  Filled 2023-10-25: qty 10

## 2023-10-25 MED ORDER — PHENYLEPHRINE 80 MCG/ML (10ML) SYRINGE FOR IV PUSH (FOR BLOOD PRESSURE SUPPORT)
PREFILLED_SYRINGE | INTRAVENOUS | Status: DC | PRN
  Administered 2023-10-25: 40 ug via INTRAVENOUS

## 2023-10-25 MED ORDER — SUCCINYLCHOLINE CHLORIDE 200 MG/10ML IV SOSY
PREFILLED_SYRINGE | INTRAVENOUS | Status: DC | PRN
  Administered 2023-10-25: 100 mg via INTRAVENOUS

## 2023-10-25 MED ORDER — LIDOCAINE HCL (CARDIAC) PF 100 MG/5ML IV SOSY
PREFILLED_SYRINGE | INTRAVENOUS | Status: DC | PRN
  Administered 2023-10-25: 60 mg via INTRAVENOUS

## 2023-10-25 MED ORDER — FENTANYL CITRATE (PF) 100 MCG/2ML IJ SOLN
INTRAMUSCULAR | Status: AC
  Filled 2023-10-25: qty 2

## 2023-10-25 MED ORDER — PENICILLIN G BENZATHINE 1200000 UNIT/2ML IM SUSY
2.4000 10*6.[IU] | PREFILLED_SYRINGE | Freq: Once | INTRAMUSCULAR | Status: AC
  Administered 2023-10-26: 2.4 10*6.[IU] via INTRAMUSCULAR
  Filled 2023-10-25: qty 4

## 2023-10-25 MED ORDER — BUPIVACAINE HCL 0.5 % IJ SOLN
INTRAMUSCULAR | Status: DC | PRN
  Administered 2023-10-25: 3 mL

## 2023-10-25 MED ORDER — 0.9 % SODIUM CHLORIDE (POUR BTL) OPTIME
TOPICAL | Status: DC | PRN
  Administered 2023-10-25: 500 mL

## 2023-10-25 MED ORDER — MIDAZOLAM HCL 2 MG/2ML IJ SOLN
INTRAMUSCULAR | Status: AC
  Filled 2023-10-25: qty 2

## 2023-10-25 MED ORDER — DEXAMETHASONE SODIUM PHOSPHATE 10 MG/ML IJ SOLN
INTRAMUSCULAR | Status: DC | PRN
  Administered 2023-10-25: 10 mg via INTRAVENOUS

## 2023-10-25 MED ORDER — MIDAZOLAM HCL 2 MG/2ML IJ SOLN
INTRAMUSCULAR | Status: DC | PRN
  Administered 2023-10-25: 2 mg via INTRAVENOUS

## 2023-10-25 MED ORDER — LACTATED RINGERS IV SOLN: INTRAVENOUS | Status: DC | PRN

## 2023-10-25 MED ORDER — DEXAMETHASONE SODIUM PHOSPHATE 10 MG/ML IJ SOLN
INTRAMUSCULAR | Status: AC
Start: 2023-10-25 — End: ?
  Filled 2023-10-25: qty 1

## 2023-10-25 MED ORDER — DEXMEDETOMIDINE HCL IN NACL 80 MCG/20ML IV SOLN
INTRAVENOUS | Status: AC
  Filled 2023-10-25: qty 20

## 2023-10-25 MED ORDER — PROPOFOL 10 MG/ML IV BOLUS
INTRAVENOUS | Status: AC
  Filled 2023-10-25: qty 20

## 2023-10-25 MED ORDER — ONDANSETRON HCL 4 MG/2ML IJ SOLN
INTRAMUSCULAR | Status: AC
Start: 2023-10-25 — End: ?
  Filled 2023-10-25: qty 2

## 2023-10-25 MED ORDER — OXYCODONE HCL 5 MG PO TABS
5.0000 mg | ORAL_TABLET | Freq: Four times a day (QID) | ORAL | Status: AC | PRN
  Administered 2023-10-25 – 2023-10-26 (×3): 5 mg via ORAL
  Filled 2023-10-25 (×3): qty 1

## 2023-10-25 MED ORDER — DEXMEDETOMIDINE HCL IN NACL 80 MCG/20ML IV SOLN
INTRAVENOUS | Status: DC | PRN
  Administered 2023-10-25: 4 ug via INTRAVENOUS
  Administered 2023-10-25: 12 ug via INTRAVENOUS

## 2023-10-25 MED ORDER — LIDOCAINE HCL (PF) 2 % IJ SOLN
INTRAMUSCULAR | Status: AC
  Filled 2023-10-25: qty 5

## 2023-10-25 MED ORDER — ONDANSETRON HCL 4 MG/2ML IJ SOLN
INTRAMUSCULAR | Status: DC | PRN
  Administered 2023-10-25: 4 mg via INTRAVENOUS

## 2023-10-25 MED ORDER — PROPOFOL 10 MG/ML IV BOLUS
INTRAVENOUS | Status: DC | PRN
  Administered 2023-10-25: 160 mg via INTRAVENOUS

## 2023-10-25 SURGICAL SUPPLY — 27 items
ADH SKN CLS APL DERMABOND .7 (GAUZE/BANDAGES/DRESSINGS) ×1
APL PRP STRL LF DISP 70% ISPRP (MISCELLANEOUS) ×1
APL SKNCLS STERI-STRIP NONHPOA (GAUZE/BANDAGES/DRESSINGS) ×1
BENZOIN TINCTURE PRP APPL 2/3 (GAUZE/BANDAGES/DRESSINGS) ×1 IMPLANT
CHLORAPREP W/TINT 26 (MISCELLANEOUS) ×1 IMPLANT
DERMABOND ADVANCED .7 DNX12 (GAUZE/BANDAGES/DRESSINGS) IMPLANT
DISSECTOR SURG LIGASURE 21 (MISCELLANEOUS) ×1 IMPLANT
DRAPE LAPAROTOMY 100X77 ABD (DRAPES) ×1 IMPLANT
GLOVE BIO SURGEON STRL SZ 6 (GLOVE) ×1 IMPLANT
GLOVE BIOGEL PI IND STRL 6 (GLOVE) ×1 IMPLANT
GOWN STRL REUS W/ TWL LRG LVL3 (GOWN DISPOSABLE) ×2 IMPLANT
GOWN STRL REUS W/TWL LRG LVL3 (GOWN DISPOSABLE) ×2
MANIFOLD NEPTUNE II (INSTRUMENTS) ×1 IMPLANT
NDL HYPO 25GX1X1/2 BEV (NEEDLE) ×1 IMPLANT
NEEDLE HYPO 25GX1X1/2 BEV (NEEDLE) ×1 IMPLANT
NS IRRIG 500ML POUR BTL (IV SOLUTION) ×1 IMPLANT
PACK BASIN MINOR ARMC (MISCELLANEOUS) ×1 IMPLANT
RETRACTOR RING XSMALL (MISCELLANEOUS) ×1 IMPLANT
RTRCTR WOUND ALEXIS 13CM XS SH (MISCELLANEOUS) ×1
STRIP CLOSURE SKIN 1/4X4 (GAUZE/BANDAGES/DRESSINGS) ×1 IMPLANT
SUT MNCRL 4-0 (SUTURE) ×1
SUT MNCRL 4-0 27XMFL (SUTURE) ×1
SUT VIC AB 2-0 CT2 27 (SUTURE) ×1 IMPLANT
SUTURE MNCRL 4-0 27XMF (SUTURE) ×1 IMPLANT
SYR 10ML LL (SYRINGE) ×1 IMPLANT
TRAP FLUID SMOKE EVACUATOR (MISCELLANEOUS) ×1 IMPLANT
WATER STERILE IRR 500ML POUR (IV SOLUTION) ×1 IMPLANT

## 2023-10-25 NOTE — Op Note (Signed)
Postpartum Bilateral Tubal Ligation Operative Report  Date of Procedure: Oct 25, 2023  Preoperative Diagnosis:  1) PPD#1, s/p SVD 2) Desire for permanent sterilization  Postoperative Diagnosis:  1) Same, s/p bilateral tubal ligation   Procedure:  Postpartum tubal ligation with Ligasure    Surgeon: Julieanne Manson, DO Anesthesia: General  EBL: minimal Findings: Normal uterus, tubes, ovaries.  Specimen: Bilateral tube segments  Complications: None  Disposition: to the recovery room in stable condition   Consent & Indications: Nancy Ball is a 27 y.o. [id number] now s/p NSVD who desires permanent sterilization. The risks, benefits, and alternatives of the procedure were reviewed with the patient on L&D after normal vaginal delivery.  She understood the tubal ligation is permanent sterilization.  The risks of the procedure include possible injury to internal organs and intra-abdominal bleeding. There is also a small risk that the procedure will fail (3-04/999), which may result in future pregnancy or ectopic pregnancy.  The patient agreed to the procedure and signed the consent form.   Procedure: After verifying informed consent, the patient was taken to the OR with IV fluid running. General anesthesia was administered and found to be adequate. The patient was then prepped and draped in the usual sterile fashion in a dorsal supine position. Two Allis clamps were placed at the lateral umbilical folds and the lateral tractions were applied on the clamps to facilitate making an infraumbilical skin incision with the scalpel. The incision was carried down to the underlying layer of fascia, then grasped with the Allis clamps and opened with Mayo scissors.  The peritoneum was identified and entered. The peritoneum was noted to be free of any adhesions, and the incision was extended with the Metzenbaum scissors.   The small Alexis retractor was then placed in the abdomen and used to expose the left cornu of  the uterus.  With manual manipulation of the uterus from outside the abdomen, the left fallopian tube was identified and brought through the incision with a Babcock clamp.  The tube was followed out to the fimbriae.    The Ligasure was used to cauterize the fallopian tube in a distal to proximal fashion.  Additional divisions were carried out through the isthmus of the tube directly inferior to the fallopian tube and on the mesosalpinx immediately inferior to the tube.  The Ligasure was then used to cauterize and cut the fallopian tube adjacent to the cornua.  Good hemostasis was noted.  Attention was then turned to the right fallopian tube, which was cauterized and removed in a similar fashion, with good hemostasis again noted.  The umbilical fascia was closed with a 2-0 Vicryl suture in a continuous running fashion.  The subcutaneous tissue was approximated with 2-0 Vicryl.  The skin was closed subcuticularly with a 4-0 Monocryl suture.   The patient tolerated the procedure well. Instrument, sponge, lap, and needle counts were correct times two at the end of the procedure. The patient was taken to the recovery room in stable condition.   Julieanne Manson, DO [facility]

## 2023-10-25 NOTE — Transfer of Care (Signed)
Immediate Anesthesia Transfer of Care Note  Patient: Nancy Ball  Procedure(s) Performed: POST PARTUM TUBAL LIGATION (Bilateral: Abdomen)  Patient Location: PACU  Anesthesia Type:General  Level of Consciousness: awake, alert , and oriented  Airway & Oxygen Therapy: Patient Spontanous Breathing  Post-op Assessment: Report given to RN and Post -op Vital signs reviewed and stable  Post vital signs: Reviewed and stable  Last Vitals:  Vitals Value Taken Time  BP 129/79 10/25/23 0915  Temp    Pulse 75 10/25/23 0917  Resp 25 10/25/23 0917  SpO2 95 % 10/25/23 0917  Vitals shown include unfiled device data.  Last Pain:  Vitals:   10/25/23 0418  TempSrc:   PainSc: Asleep      Patients Stated Pain Goal: 0 (10/24/23 1814)  Complications: No notable events documented.

## 2023-10-25 NOTE — TOC CM/SW Note (Signed)
CSW received consult for drug exposed NB. CSW called 3rd floor to confirm patient's room status. Patient currently in surgery but the nurse will reach out when she is back and alert.

## 2023-10-25 NOTE — Lactation Note (Signed)
This note was copied from a baby's chart. Lactation Consultation Note  Patient Name: Nancy Ball Today's Date: 10/25/2023 Age:27 hours Reason for consult: Follow-up assessment;Early term 37-38.6wks   Maternal Data Lactation to room for a follow up assessment w/ a 22hr old baby Nancy.  Patient stated that she was interested in pumping and wanted some assistance.  Patient does not have a pump at home.    Per patient she feels like infant is going to the breast easier than her other children.  Feeding Mother's Current Feeding Choice: Breast Milk and Formula  No feeding observed infant not in room at time of visit.  Interventions Interventions: Breast feeding basics reviewed;DEBP;Education  LC assisted patient with pumping.  Patient is using a 21mm flange on the left breast and a 24mm flange on the rt breast.  Patient pumped out 5ml of EBM.    Milk production expectations were reviewed and feeding infant on demand.  LC provided education on tobacco use and alcohol use while breastfeeding.  Patient verbalized understanding.   Discharge Pump: DEBP;Advised to call insurance company [facility] Program: Yes  Consult Status Consult Status: Follow-up Follow-up type: In-patient    Yvette Rack Free 10/25/2023, 2:49 PM

## 2023-10-25 NOTE — Anesthesia Preprocedure Evaluation (Signed)
Anesthesia Evaluation  Patient identified by MRN, date of birth, ID band Patient awake    Reviewed: Allergy & Precautions, H&P , NPO status , Patient's Chart, lab work & pertinent test results, reviewed documented beta blocker date and time   History of Anesthesia Complications Negative for: history of anesthetic complications  Airway Mallampati: III  TM Distance: >3 FB Neck ROM: full    Dental  (+) Dental Advidsory Given, Teeth Intact   Pulmonary neg shortness of breath, asthma , Continuous Positive Airway Pressure Ventilation , neg COPD, neg recent URI, former smoker   Pulmonary exam normal breath sounds clear to auscultation       Cardiovascular Exercise Tolerance: Good negative cardio ROS Normal cardiovascular exam Rhythm:regular Rate:Normal     Neuro/Psych negative neurological ROS  negative psych ROS   GI/Hepatic negative GI ROS, Neg liver ROS,,,  Endo/Other  negative endocrine ROS    Renal/GU negative Renal ROS  negative genitourinary   Musculoskeletal   Abdominal   Peds  Hematology  (+) Blood dyscrasia, anemia   Anesthesia Other Findings Past Medical History: 06/03/2017: Anemia complicating pregnancy, third trimester No date: Anemia during pregnancy     Comment:  Reports with all pregnancies No date: Arthritis     Comment:  juvenile, stable since 2019, was taking Meloxicam /               cortisone injections No date: Asthma     Comment:  last SOB episode early 2024 No date: Depression     Comment:  PP depression - reports with all pregnancies 05/2021: History of spontaneous abortion 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 Reports received 2 injections               one time. 07/2016: Hypertension     Comment:  Post-partum pre-eclampsia 08/17/2023: Indication for care in labor and delivery, antepartum No date: Lupus     Comment:  Reports told by MD in 2015 that had  all the signs /               symptoms of Lupus, never officially diagnosed per client. No date: Postpartum depression     Comment:  Reports with all pregnancies. 07/09/2018: Pregnancy 04/30/2023: Tobacco abuse  10-3 cpd     Comment:  Quit 07/16/23     Reproductive/Obstetrics (+) Breast feeding                              Anesthesia Physical Anesthesia Plan  ASA: 2  Anesthesia Plan: General   Post-op Pain Management:    Induction: Intravenous  PONV Risk Score and Plan: 3 and Ondansetron, Dexamethasone, Midazolam and Treatment may vary due to age or medical condition  Airway Management Planned: Oral ETT  Additional Equipment:   Intra-op Plan:   Post-operative Plan: Extubation in OR  Informed Consent: I have reviewed the patients History and Physical, chart, labs and discussed the procedure including the risks, benefits and alternatives for the proposed anesthesia with the patient or authorized representative who has indicated his/her understanding and acceptance.     Dental Advisory Given  Plan Discussed with: Anesthesiologist, CRNA and Surgeon  Anesthesia Plan Comments:        Anesthesia Quick Evaluation

## 2023-10-25 NOTE — Anesthesia Procedure Notes (Signed)
Procedure Name: Intubation Date/Time: 10/25/2023 8:19 AM  Performed by: Karoline Caldwell, CRNAPre-anesthesia Checklist: Patient identified, Patient being monitored, Timeout performed, Emergency Drugs available and Suction available Patient Re-evaluated:Patient Re-evaluated prior to induction Oxygen Delivery Method: Circle system utilized Preoxygenation: Pre-oxygenation with 100% oxygen Induction Type: IV induction Ventilation: Mask ventilation without difficulty Laryngoscope Size: 3 and McGraph Grade View: Grade I Tube type: Oral Tube size: 7.0 mm Number of attempts: 1 Airway Equipment and Method: Stylet Placement Confirmation: ETT inserted through vocal cords under direct vision, positive ETCO2 and breath sounds checked- equal and bilateral Secured at: 21 cm Tube secured with: Tape Dental Injury: Teeth and Oropharynx as per pre-operative assessment

## 2023-10-25 NOTE — Progress Notes (Signed)
[facility]  Subjective:  Doing well post op on postpartum day 1; she is having mild discomfort since BTL and reports generally her pain is controlled with PO medication. She is tolerating regular diet, she is ambulating and voiding without difficulty. She reports breastfeeding is going well.  Objective:  Vital signs in last 24 hours: Temp:  [97.7 F (36.5 C)-98.6 F (37 C)] 98.6 F (37 C) (11/03 0950) Pulse Rate:  [64-81] 64 (11/03 1000) Resp:  [9-28] 9 (11/03 1000) BP: (98-129)/(57-84) 115/79 (11/03 1000) SpO2:  [95 %-100 %] 96 % (11/03 1000)    General: NAD Pulmonary: no increased work of breathing Abdomen: non-distended, non-tender, fundus firm at level of umbilicus Extremities: no edema, no erythema, no tenderness  Results for orders placed or performed during the hospital encounter of 10/24/23 (from the past 72 hour(s))  CBC     Status: Abnormal   Collection Time: 10/24/23  9:03 AM  Result Value Ref Range   WBC 9.4 4.0 - 10.5 K/uL   RBC 3.64 (L) 3.87 - 5.11 MIL/uL   Hemoglobin 10.7 (L) 12.0 - 15.0 g/dL   HCT 28.4 (L) 13.2 - 44.0 %   MCV 86.3 80.0 - 100.0 fL   MCH 29.4 26.0 - 34.0 pg   MCHC 34.1 30.0 - 36.0 g/dL   RDW 10.2 72.5 - 36.6 %   Platelets 228 150 - 400 K/uL   nRBC 0.0 0.0 - 0.2 %    Comment: Performed at [facility], [address] [place], [place] [address]  Type and screen     Status: None   Collection Time: 10/24/23  9:03 AM  Result Value Ref Range   ABO/RH(D) B POS    Antibody Screen NEG    Sample Expiration      10/27/2023,2359 Performed at [facility], [address] [place], [place] [address]   Urine Drug Screen, Qualitative ([facility] only)     Status: None   Collection Time: 10/24/23  9:03 AM  Result Value Ref Range   Tricyclic, Ur Screen NONE DETECTED NONE DETECTED   Amphetamines, Ur Screen NONE DETECTED NONE DETECTED   MDMA (Ecstasy)Ur Screen NONE DETECTED NONE DETECTED   Cocaine Metabolite,Ur [place] NONE DETECTED NONE  DETECTED   Opiate, Ur Screen NONE DETECTED NONE DETECTED   Phencyclidine (PCP) Ur S NONE DETECTED NONE DETECTED   Cannabinoid 50 Ng, Ur [place] NONE DETECTED NONE DETECTED   Barbiturates, Ur Screen NONE DETECTED NONE DETECTED   Benzodiazepine, Ur Scrn NONE DETECTED NONE DETECTED   Methadone Scn, Ur NONE DETECTED NONE DETECTED    Comment: (NOTE) Tricyclics + metabolites, urine    Cutoff 1000 ng/mL Amphetamines + metabolites, urine  Cutoff 1000 ng/mL MDMA (Ecstasy), urine              Cutoff 500 ng/mL Cocaine Metabolite, urine          Cutoff 300 ng/mL Opiate + metabolites, urine        Cutoff 300 ng/mL Phencyclidine (PCP), urine         Cutoff 25 ng/mL Cannabinoid, urine                 Cutoff 50 ng/mL Barbiturates + metabolites, urine  Cutoff 200 ng/mL Benzodiazepine, urine              Cutoff 200 ng/mL Methadone, urine                   Cutoff 300 ng/mL  The  urine drug screen provides only a preliminary, unconfirmed analytical test result and should not be used for non-medical purposes. Clinical consideration and professional judgment should be applied to any positive drug screen result due to possible interfering substances. A more specific alternate chemical method must be used in order to obtain a confirmed analytical result. Gas chromatography / mass spectrometry (GC/MS) is the preferred confirm atory method. Performed at [facility], [address] [place], [place] [address]   RPR     Status: Abnormal   Collection Time: 10/24/23  9:03 AM  Result Value Ref Range   RPR Ser Ql Reactive (A) NON REACTIVE    Comment: SENT FOR CONFIRMATION   RPR Titer 1:8     Comment: Performed at [facility], [address] [place], [place] [address]  CBC     Status: Abnormal   Collection Time: 10/25/23  6:09 AM  Result Value Ref Range   WBC 10.3 4.0 - 10.5 K/uL   RBC 3.51 (L) 3.87 - 5.11 MIL/uL   Hemoglobin 10.4 (L) 12.0 - 15.0 g/dL   HCT 08.6 (L) 57.8 - 46.9 %   MCV 84.3  80.0 - 100.0 fL   MCH 29.6 26.0 - 34.0 pg   MCHC 35.1 30.0 - 36.0 g/dL   RDW 62.9 52.8 - 41.3 %   Platelets 204 150 - 400 K/uL   nRBC 0.0 0.0 - 0.2 %    Comment: Performed at [facility], [address] [place], [place] [address]    Assessment:   27 y.o. [phone] postpartum day # 1, lactating  Plan:    1) Acute blood loss anemia not clinically significant for this admission - hemodynamically stable and asymptomatic - po ferrous sulfate  2) Blood Type --/--/B POS (11/02 6440) / Rubella 13.70 (05/09 1131) / Varicella  immune per 2018 and 2019 L&D H&Ps  (unable to find corroborating lab result- Varicella "Positive" per [facility] OB Prenatal scan in [place] on 04/23/18)  3) RPR reactive/ T Pallidum 1:8 2 months ago (current result pending), scheduled for 3rd of 3 doses Bicillin tomorrow prior to discharge  4) TDAP status  given antepartum  5) Feeding plan  breast and formula  6)  Education given regarding options for contraception, as well as compatibility with breast feeding if applicable.  Bilateral tubal ligation done this morning  7) Disposition: continue current care   Tresea Mall, CNM [facility] 10/25/2023 11:11 AM

## 2023-10-26 ENCOUNTER — Encounter: Payer: Self-pay | Admitting: Certified Nurse Midwife

## 2023-10-26 ENCOUNTER — Other Ambulatory Visit: Payer: Self-pay

## 2023-10-26 ENCOUNTER — Ambulatory Visit: Payer: Medicaid Other

## 2023-10-26 ENCOUNTER — Encounter: Payer: Self-pay | Admitting: Obstetrics

## 2023-10-26 DIAGNOSIS — Z302 Encounter for sterilization: Secondary | ICD-10-CM

## 2023-10-26 HISTORY — DX: Encounter for sterilization: Z30.2

## 2023-10-26 MED ORDER — BENZOCAINE-MENTHOL 20-0.5 % EX AERO: 1.0000 | INHALATION_SPRAY | CUTANEOUS | Status: DC | PRN

## 2023-10-26 MED ORDER — ESCITALOPRAM OXALATE 10 MG PO TABS
10.0000 mg | ORAL_TABLET | Freq: Every day | ORAL | 2 refills | Status: AC
  Filled 2023-10-26: qty 30, 33d supply, fill #0
  Filled 2023-12-30: qty 30, 33d supply, fill #1

## 2023-10-26 MED ORDER — IBUPROFEN 600 MG PO TABS
600.0000 mg | ORAL_TABLET | Freq: Four times a day (QID) | ORAL | 0 refills | Status: AC | PRN
  Filled 2023-10-26: qty 30, 8d supply, fill #0

## 2023-10-26 MED ORDER — ACETAMINOPHEN 325 MG PO TABS
650.0000 mg | ORAL_TABLET | ORAL | 1 refills | Status: AC | PRN
  Filled 2023-10-26: qty 30, 3d supply, fill #0
  Filled 2023-12-30: qty 30, 3d supply, fill #1

## 2023-10-26 MED ORDER — ESCITALOPRAM OXALATE 10 MG PO TABS
5.0000 mg | ORAL_TABLET | Freq: Every day | ORAL | Status: DC
  Administered 2023-10-26: 5 mg via ORAL
  Filled 2023-10-26: qty 1

## 2023-10-26 MED ORDER — DOCUSATE SODIUM 100 MG PO CAPS
100.0000 mg | ORAL_CAPSULE | Freq: Two times a day (BID) | ORAL | 1 refills | Status: AC
  Filled 2023-10-26: qty 30, 15d supply, fill #0
  Filled 2023-12-30: qty 30, 15d supply, fill #1

## 2023-10-26 NOTE — Progress Notes (Signed)
Pt discharged, infant in SCN.  Discharge instructions, prescriptions and follow up appointment given to and reviewed with pt. Pt verbalized understanding. Escorted out by auxillary. 

## 2023-10-26 NOTE — Anesthesia Postprocedure Evaluation (Signed)
Anesthesia Post Note  Patient: [employer]  Procedure(s) Performed: POST PARTUM TUBAL LIGATION (Bilateral: Abdomen)  Patient location during evaluation: PACU Anesthesia Type: General Level of consciousness: awake and alert Pain management: pain level controlled Vital Signs Assessment: post-procedure vital signs reviewed and stable Respiratory status: spontaneous breathing, nonlabored ventilation, respiratory function stable and patient connected to nasal cannula oxygen Cardiovascular status: blood pressure returned to baseline and stable Postop Assessment: no apparent nausea or vomiting Anesthetic complications: no   No notable events documented.   Last Vitals:  Vitals:   10/25/23 1716 10/25/23 2336  BP: 115/70 118/76  Pulse: 68 75  Resp: 19 18  Temp: 36.9 C 36.9 C  SpO2: 99% 99%    Last Pain:  Vitals:   10/26/23 0026  TempSrc:   PainSc: Asleep                 Lenard Simmer

## 2023-10-26 NOTE — Lactation Note (Signed)
This note was copied from a baby's chart. Lactation Consultation Note  Patient Name: Nancy Ball Today's Date: 10/26/2023 Age:27 hours Reason for consult: Follow-up assessment;Early term 37-38.6wks;Breastfeeding assistance;Maternal discharge   Maternal Data Lactation to room for a follow up assessment/to provide d/c information to MOB.  Infant is currently in the NICU.    Feeding Mother's Current Feeding Choice: Breast Milk and Formula Nipple Type: Slow - flow  LC observed a feeding at the breast in NICU.  LC assisted patient with pillow position of infant.  Patient is able to latch infant comfortably.  Infant opens mouth wide, lips flanged out at the breast, audible swallows heard.    LATCH Score Latch: Grasps breast easily, tongue down, lips flanged, rhythmical sucking.  Audible Swallowing: Spontaneous and intermittent  Type of Nipple: Everted at rest and after stimulation  Comfort (Breast/Nipple): Soft / non-tender  Hold (Positioning): No assistance needed to correctly position infant at breast.  LATCH Score: 10  Interventions Interventions: Adjust position;Position options;Support pillows;Education;CDC Guidelines for Breast Pump Cleaning  Discharge Discharge Education: Engorgement and breast care;Outpatient recommendation WIC Program: Yes  Education on engorgement prevention/treatment was discussed as well as breastmilk storage guidelines.  LC provided patient with a handout on breastmilk storage guidelines from [facility]. [facility] outpatient lactation services phone number written on the white board in the room.  Patient verbalized understanding   LC faxed a [facility] referral on behalf of patient.  Patient stated that she did reach out to her insurance company and they have not gotten back with her yet in regards to receiving a pump.   Consult Status Consult Status: Follow-up Follow-up type: Call as needed    Yvette Rack Free 10/26/2023, 10:45 AM

## 2023-10-26 NOTE — Anesthesia Postprocedure Evaluation (Signed)
Anesthesia Post Note  Patient: [employer]  Procedure(s) Performed: AN AD HOC LABOR EPIDURAL  Patient location during evaluation: Mother Baby Anesthesia Type: Epidural Level of consciousness: oriented and awake and alert Pain management: pain level controlled Vital Signs Assessment: post-procedure vital signs reviewed and stable Respiratory status: spontaneous breathing and respiratory function stable Cardiovascular status: blood pressure returned to baseline and stable Postop Assessment: no headache, no backache, no apparent nausea or vomiting and able to ambulate Anesthetic complications: no  No notable events documented.   Last Vitals:  Vitals:   10/25/23 1716 10/25/23 2336  BP: 115/70 118/76  Pulse: 68 75  Resp: 19 18  Temp: 36.9 C 36.9 C  SpO2: 99% 99%    Last Pain:  Vitals:   10/26/23 0026  TempSrc:   PainSc: Asleep                 Starling Manns

## 2023-10-26 NOTE — Discharge Instructions (Signed)
Mental Health Resources for Therapy  [facility] [place]  Make an appointment: [phone]  [facility] - Located in [facility]  Make an appointment: [phone]  [facility] [place]  Make an appointment: [phone]  [address]  [place], [place] [address]  For additional local or virtual therapy appointment options, you can visit [email or web address] and filter by insurance and city to find local providers. Provider profiles include specialties to find the best fit for you!  

## 2023-10-26 NOTE — TOC Transition Note (Signed)
Transition of Care [facility]) - CM/SW Discharge Note   Patient Details  Name: Nancy Ball MRN: [id number] Date of Birth: March 03, 1996  [facility] of [facility]) CM/SW Contact:  Chapman Fitch, RN Phone Number: 10/26/2023, 9:29 AM   Clinical Narrative:     [facility] consult for Nancy Ball of 13  TOC assessment was completed by Nancy Ball 11/3 and noted in baby's chart.  Please see that assessment below  "CSW received a consult for Drug exposed Newborn   CSW spoke with RN Huston Foley prior to meeting with MOB. Per RN, the baby did not test positive for drugs   CSW met with MOB at bedside. She was alone MOB elected. Explained CSW's role and reason for referral.   MOB reported she is feeling "ok" post delivery. MOB was (alert/appropriate/attentive to) during assessment.    Confirmed contact information for MOB. MOB and Baby will be living with her and her child's father at discharge.    [facility] Drug Screen/CPS Report Policy. Called CPS Report to make a spoke with Fleet Contras, relayed information to CPS worker   MOB reported she receives WIC/Food Veterinary surgeon and will inform her Workers [employer] birth. MOB plans to use [facility] for [facility]. MOB reported she has crib/bassinett/pack and play, car seat, clothing, diapers, and all other items needed for Baby. MOB reported she has reliable transportation for herself and Baby. MOB denied resource needs at this time.    MOB reported she has pos partum depression mental health history. MOB reported no previous mental health treatments- however she has been on lexapro in the past. MOB reported she has a good support system and is coping well emotionally at this time. MOB denied SI, HI, or DV. MOB approved the need for mental health support resources at this time. CSW provided education and information sheets on PPD and SIDS. MOB verbalized understanding. CSW ecouraged MOB to reach out to her Provider with any questions or needs for support or  resources, even after discharge.    MOB denied any needs or questions at this time. CSW encouraged MOB to reach out if any arise prior to discharge.    Please re consult CSW if any additional needs or concerns arise."   PPD education and information has been provided by previous TOC, and patient was encouraged to reached out to her provider for any additional needs or support.         Patient Goals and CMS Choice      Discharge Placement                         Discharge Plan and Services Additional resources added to the After Visit Summary for                                       Social Determinants of Health (SDOH) Interventions SDOH Screenings   Food Insecurity: No Food Insecurity (10/24/2023)  Housing: Low Risk  (10/24/2023)  Transportation Needs: No Transportation Needs (10/24/2023)  Utilities: Not At Risk (10/24/2023)  Depression (PHQ2-9): Low Risk  (10/19/2023)  Financial Resource Strain: Low Risk  (04/30/2023)  Tobacco Use: Medium Risk (10/25/2023)     Readmission Risk Interventions     No data to display

## 2023-10-27 LAB — T.PALLIDUM AB, TOTAL: T Pallidum Abs: REACTIVE — AB

## 2023-10-27 LAB — SURGICAL PATHOLOGY

## 2023-10-28 ENCOUNTER — Other Ambulatory Visit: Payer: Medicaid Other

## 2023-10-28 ENCOUNTER — Ambulatory Visit: Payer: Medicaid Other

## 2023-10-29 ENCOUNTER — Telehealth: Payer: Self-pay | Admitting: Physician Assistant

## 2023-10-30 ENCOUNTER — Ambulatory Visit: Payer: Self-pay

## 2023-10-30 NOTE — Lactation Note (Signed)
This note was copied from a baby's chart. Lactation Consultation Note  Patient Name: Nancy Ball Today's Date: 10/30/2023 Age:27 days   LC completing rounds in the SCN.  Mom stated that pumping is going well.  The day before she was here at the hospital nursing infant and noticed infant got chocked up while nursing.   LC talked to patient about lean back positioning especially if she is feeling a strong let down because it may be overwhelming to baby.  MOB verbalized understanding and stated that she would try that.   Feeding Nipple Type: Slow - flow  Lactation Tools Discussed/Used Tools: Bottle      Yvette Rack Sangita Zani 10/30/2023, 2:25 PM

## 2023-11-03 ENCOUNTER — Other Ambulatory Visit: Payer: Medicaid Other

## 2023-11-03 DIAGNOSIS — F1490 Cocaine use, unspecified, uncomplicated: Secondary | ICD-10-CM

## 2023-11-03 DIAGNOSIS — O99320 Drug use complicating pregnancy, unspecified trimester: Secondary | ICD-10-CM

## 2023-11-03 NOTE — Progress Notes (Signed)
In nurse clinic for UDS after delivering her baby 10/24/2023. [facility] Dr. Alice Rieger requested that this test be done and a verbal order per E. Sciora, CNM was given. ROI signed. Advised patient that a provider would call her once results were finalized. All questions answered and sent to lab.  Abagail Kitchens, RN

## 2023-11-04 ENCOUNTER — Telehealth: Payer: Self-pay

## 2023-11-04 NOTE — Telephone Encounter (Signed)
Call to client to ascertain if plans 6 week post-partum appt with [facility] as would like to schedule appt for her or does she plan 6 week check up with [facility]. Requested she return my call and number provided. Client has 11/11/23 appt with [facility] for 2 week post-partum mood check and medication management appt (started on Lexipro). Jossie Ng, RN

## 2023-11-10 ENCOUNTER — Encounter: Payer: Self-pay | Admitting: Advanced Practice Midwife

## 2023-11-10 ENCOUNTER — Telehealth: Payer: Self-pay | Admitting: Family Medicine

## 2023-11-10 DIAGNOSIS — R825 Elevated urine levels of drugs, medicaments and biological substances: Secondary | ICD-10-CM | POA: Insufficient documentation

## 2023-11-10 LAB — TOXASSURE FLEX 15, UR
6-ACETYLMORPHINE IA: NEGATIVE ng/mL
7-aminoclonazepam: NOT DETECTED ng/mg{creat}
AMPHETAMINES IA: NEGATIVE ng/mL
Alpha-hydroxyalprazolam: NOT DETECTED ng/mg{creat}
Alpha-hydroxymidazolam: NOT DETECTED ng/mg{creat}
Alpha-hydroxytriazolam: NOT DETECTED ng/mg{creat}
Alprazolam: NOT DETECTED ng/mg{creat}
BARBITURATES IA: NEGATIVE ng/mL
BUPRENORPHINE: NEGATIVE
Benzodiazepines: NEGATIVE
Buprenorphine: NOT DETECTED ng/mg{creat}
COCAINE METABOLITE IA: NEGATIVE ng/mL
Clonazepam: NOT DETECTED ng/mg{creat}
Creatinine: 90 mg/dL
Desalkylflurazepam: NOT DETECTED ng/mg{creat}
Desmethyldiazepam: NOT DETECTED ng/mg{creat}
Desmethylflunitrazepam: NOT DETECTED ng/mg{creat}
Diazepam: NOT DETECTED ng/mg{creat}
ETHYL ALCOHOL Enzymatic: POSITIVE g/dL
FENTANYL: NEGATIVE
Fentanyl: NOT DETECTED ng/mg{creat}
Flunitrazepam: NOT DETECTED ng/mg{creat}
Lorazepam: NOT DETECTED ng/mg{creat}
METHADONE IA: NEGATIVE ng/mL
METHADONE MTB IA: NEGATIVE ng/mL
Midazolam: NOT DETECTED ng/mg{creat}
Norbuprenorphine: NOT DETECTED ng/mg{creat}
Norfentanyl: NOT DETECTED ng/mg{creat}
OPIATE CLASS IA: NEGATIVE ng/mL
OXYCODONE CLASS IA: NEGATIVE ng/mL
Oxazepam: NOT DETECTED ng/mg{creat}
PHENCYCLIDINE IA: NEGATIVE ng/mL
TAPENTADOL, IA: NEGATIVE ng/mL
TRAMADOL IA: NEGATIVE ng/mL
Temazepam: NOT DETECTED ng/mg{creat}

## 2023-11-10 LAB — ALCOHOL, ETHYL, UR, QT
Alcohol, Ethyl: 0.076 g/dL
Alcohol, Ethyl: POSITIVE

## 2023-11-10 LAB — CANNABINOIDS, MS, UR RFX
Cannabinoids Confirmation: POSITIVE
Carboxy-THC: 30 ng/mg{creat}

## 2023-11-10 NOTE — Telephone Encounter (Addendum)
Called patient to discuss lab results. UDS on 11/12 resulted positive for alcohol and cannabinoids.   Clarified with Ms. Beilke that Dr. Alice Rieger was one of the NICU physicians who recommended she undergo UDS testing to ensure breast milk is safe for infant consumption.   Ms. Deshane reports that she has not used cannabis products since delivery. She reports a "drink or two" sometimes but that she pumps and dumps when she drinks alcohol. She endorses using formula whenever she questions her breast milk. She also confirms that the NICU medical team showed her how to use breast milk testing strips when there is a question of alcohol in her milk.   Ms. Trapani confirms baby has his first appointment at [facility] on 11/26. Says baby and herself have been doing great since coming home from the NICU on 11/13. No concerns. She has her own follow up appointment with [facility] tomorrow, 11/20, to discuss mood and side effects since starting Lexapro. Discussed returning to [facility] for routine 6 week post partum visit (or sooner if she has any concerns).   All questions answered.   Fayette Pho, MD 11/10/23  7:09 PM

## 2023-11-11 ENCOUNTER — Encounter: Payer: Self-pay | Admitting: Certified Nurse Midwife

## 2023-11-11 ENCOUNTER — Ambulatory Visit: Payer: Medicaid Other | Admitting: Certified Nurse Midwife

## 2023-11-11 DIAGNOSIS — Z1332 Encounter for screening for maternal depression: Secondary | ICD-10-CM

## 2023-11-11 DIAGNOSIS — Z8659 Personal history of other mental and behavioral disorders: Secondary | ICD-10-CM

## 2023-11-11 NOTE — Progress Notes (Signed)
SUBJECTIVE  Nancy Ball is a 27 y.o. [id number] who gave birth to a female infant at [redacted]w[redacted]d weeks via NSVD after IOL for IUGR on 10/24/2023 with  epidural anesthesia.  She is breast feeding and bottle feeding. She is here today for a PP follow-up visit for mood/medication check. She was started on Lexapro prior to discharge due to history of PPD. Her prenatal care was received through [facility]. She reports bleeding is light. Baby is feeding by breast, occasional pumped milk or formula. She endorses occasional 1-2 alcoholic beverages and uses test strips and pumps & dumps as needed. She is not sexually active. The glue came off of her tubal incision site, denies pain or discharge. Mood has been up and down, feels lexapro is helping but would like to feel better. She endorses crying at times, especially when alone at home with baby. Her mother is in a longterm care facility due to dementia. Her older children are with their father right now in order to allow her time for healing and adjust to the new baby.  ROS  Pertinent items are noted in HPI.  Mood  EPDS 8, never to #10  Bowel function: occasional constipation, taking colace Bladder function: normal Vaginal bleeding: scant to light Pain: denies  Denies difficulty breathing, chest pain, lower extremity pain or swelling, excessive vaginal bleeding, vaginal pain.   Infant: Gaining well since discharge!  OBJECTIVE Last Weight  Most recent update: 11/11/2023  3:15 PM    Weight  59 kg (130 lb)            Body mass index is 26.26 kg/m.  BP 129/84   Pulse 71   Wt 130 lb (59 kg)   LMP 02/02/2023 (Within Days)   Breastfeeding Yes   BMI 26.26 kg/m  Covid-19 mrna vacc (moderna), Excedrin migraine [aspirin-acetaminophen-caffeine], and Shellfish allergy  Past Medical/Surgical History Past Medical History:  Diagnosis Date   Anemia complicating pregnancy, third trimester 06/03/2017   Anemia during pregnancy    Reports with all  pregnancies   Arthritis    juvenile, stable since 2019, was taking Meloxicam / cortisone injections   Asthma    last SOB episode early 2024   Depression    PP depression - reports with all pregnancies   Encounter for sterilization 10/26/2023   History of spontaneous abortion 05/2021   History of syphilis 05/2021   Reports diagnosed at [facility]. Reports received 2 injections one time.   Hypertension 07/2016   Post-partum pre-eclampsia   Indication for care in labor and delivery, antepartum 08/17/2023   Lupus    Reports told by MD in 2015 that had all the signs / symptoms of Lupus, never officially diagnosed per client.   Postpartum depression    Reports with all pregnancies.   Pregnancy 07/09/2018   Tobacco abuse  10-3 cpd 04/30/2023   Quit 07/16/23     Past Surgical History:  Procedure Laterality Date   DILATION AND EVACUATION     [facility]/15/2021 at [facility]   TUBAL LIGATION Bilateral 10/25/2023   Procedure: POST PARTUM TUBAL LIGATION;  Surgeon: Julieanne Manson, MD;  Location: [facility] ORS;  Service: Gynecology;  Laterality: Bilateral;   WISDOM TOOTH EXTRACTION Bilateral 2013   states she was put to sleep    Last Pap:   BP 129/84   Pulse 71   Wt 130 lb (59 kg)   LMP 02/02/2023 (Within Days)   Breastfeeding Yes   BMI 26.26 kg/m  General appearance: alert, cooperative, appears  stated age, and no distress Abdomen:  soft, nontender, BTL incision site well healed. Uterus palpated at [facility]  ASSESSMENT 1. Postpartum care following vaginal delivery  2. Encounter for screening for maternal depression  3. History of postpartum depression    PLAN continue Lexapro at 10mg  daily, discussed increasing now vs follow up in 2w to reassess. Would like to follow up in 2w and increase then if needed. Will call or send MyChart message sooner if needed. Routine postpartum care, continue feeding on demand.  Dominica Severin, CNM

## 2023-11-11 NOTE — Telephone Encounter (Signed)
Per EPIC appt desk, client kept post-partum mood check appt today with [facility] and has appt on 11/25/23 for another mood check. 6 week post-partum appt scheduled for 12/09/23 at [facility]. Jossie Ng, RN

## 2023-11-25 ENCOUNTER — Ambulatory Visit: Payer: Medicaid Other | Admitting: Certified Nurse Midwife

## 2023-11-25 ENCOUNTER — Telehealth: Payer: Self-pay | Admitting: Certified Nurse Midwife

## 2023-11-25 NOTE — Telephone Encounter (Signed)
Reached out to pt to reschedule pp visit that was scheduled on 12/4/024 at 3:35 with J. Keitha Butte.  Phone did not ring.  Left message for pt to call back.

## 2023-11-26 ENCOUNTER — Other Ambulatory Visit: Payer: Self-pay | Admitting: Physician Assistant

## 2023-11-26 ENCOUNTER — Encounter: Payer: Self-pay | Admitting: Certified Nurse Midwife

## 2023-11-26 DIAGNOSIS — J4520 Mild intermittent asthma, uncomplicated: Secondary | ICD-10-CM

## 2023-11-26 NOTE — Telephone Encounter (Signed)
Reached out to pt (2x) to reschedule pp visit that was scheduled on 11/25/2023 at 3:35 with J. Keitha Butte.  Left message for pt to call back.  Will send a MyChart letter.

## 2023-12-09 ENCOUNTER — Telehealth: Payer: Self-pay | Admitting: Certified Nurse Midwife

## 2023-12-09 ENCOUNTER — Ambulatory Visit: Payer: Medicaid Other | Admitting: Certified Nurse Midwife

## 2023-12-09 DIAGNOSIS — F53 Postpartum depression: Secondary | ICD-10-CM

## 2023-12-09 NOTE — Telephone Encounter (Signed)
Reached out to pt to reschedule 6 week pp visit that was scheduled on 12/09/2023 at 3:55 with J. Keitha Butte.  Left message for pt to call back.

## 2023-12-10 ENCOUNTER — Encounter: Payer: Self-pay | Admitting: Certified Nurse Midwife

## 2023-12-10 NOTE — Telephone Encounter (Signed)
Reached out to pt (2x) to reschedule 6 week pp visit that was scheduled on 12/09/2023 at 3:55 with J. Keitha Butte.  Left message for pt to call back. Will send a MyChart letter.

## 2023-12-30 ENCOUNTER — Other Ambulatory Visit: Payer: Self-pay

## 2023-12-30 NOTE — Telephone Encounter (Signed)
 Patient seen for PP visit at [facility] on 11/11/23. Drug test results from 11/03/23 in labs in EPIC available. [facility] has reached out since that time (12/22/23) and left message asking patient to call for appointment. Also, J. Jayne from [facility] has reached out to patient twice for [facility] appointment per 12/09/23 phone notes. Hulan Parish, RN

## 2024-01-12 ENCOUNTER — Other Ambulatory Visit: Payer: Self-pay

## 2024-09-16 ENCOUNTER — Telehealth: Payer: Self-pay

## 2024-09-16 NOTE — Telephone Encounter (Signed)
 Phone call to pt at [phone]. Pt answered and confirmed identity. Discussed report from plasma facility. 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 Counseled to refrain from plasma/blood donations. Pt states she is off of work on Monday and can come to [facility] for f/u.  Appt scheduled for 09/19/24 visit with provider.

## 2024-09-16 NOTE — Telephone Encounter (Signed)
 [facility] received CD report from [facility] re positive syphilis: Specimen date= 09/09/24 Specimen source = plasma Type of test= STS Test result= Reactive - preliminary Result date= 09/14/24  Pt has hx of syphilis (2022) Most recent results in Epic: 10/24/23, reactive, 1:8

## 2024-09-19 ENCOUNTER — Ambulatory Visit

## 2024-09-21 NOTE — Telephone Encounter (Signed)
 Received call from Dama Byes, DIS. Scheduled provider appt for 09/26/24.

## 2024-09-21 NOTE — Telephone Encounter (Signed)
 Nancy Ball, DIS, received event. She will be trying to reach pt also. [name] aware of pt's missed appt.

## 2024-09-26 ENCOUNTER — Ambulatory Visit

## 2024-09-26 NOTE — Telephone Encounter (Signed)
 Spoke with [name] Stackhouse, DIS. She is aware pt missed appt this morning. She placed call to pt and left her a message.

## 2024-09-26 NOTE — Telephone Encounter (Signed)
 Dama Byes, DIS, called. Rescheduled provider appt for 09/29/24.

## 2024-09-29 ENCOUNTER — Ambulatory Visit

## 2024-09-30 NOTE — Telephone Encounter (Signed)
 Pt DNKA on 09/29/24.  Left message for Dama Byes, DIS, re missed appt. ------------ This is the 3rd no show/missed appt.

## 2024-10-04 NOTE — Telephone Encounter (Signed)
 10/04/24 Spoke with Dama Byes, DIS. Pt told DIS that she planned to walk in to [facility] yesterday, 10/03/24, to be seen. No indication in Epic that pt presented to [facility] on 10/03/24.

## 2024-10-04 NOTE — Telephone Encounter (Signed)
 Dama Byes, DIS, called.  Pt rescheduled for 10/05/24.

## 2024-10-05 ENCOUNTER — Ambulatory Visit: Admitting: Family Medicine

## 2024-10-05 DIAGNOSIS — Z113 Encounter for screening for infections with a predominantly sexual mode of transmission: Secondary | ICD-10-CM

## 2024-10-05 LAB — HM HIV SCREENING LAB: HM HIV Screening: NEGATIVE

## 2024-10-05 NOTE — Progress Notes (Signed)
 Patient with a history of syphilis.  Recent report of positive syphilis from plasma facility.  Patient unable to keep appointments at [facility] due to transportation.  Field visit completed by DIS, Dama Byes.  Blood drawn for HIV and Syphilis.

## 2024-10-17 ENCOUNTER — Ambulatory Visit: Payer: Self-pay | Admitting: Family Medicine

## 2024-10-17 LAB — SYPHILIS SEROLOGY, ~~LOC~~ LAB
RPR, Quant: 1:8 {titer}
RPR: REACTIVE
Syphilis Treponemal Ab: REACTIVE

## 2024-10-17 NOTE — Progress Notes (Signed)
 Blood drawn 10/05/24 in the field by DIS Dama Byes for evaluation of positive syphilis screen at plasma donation center. Last RPR results on file for comparison are from delivery of youngest baby Nov. 2024.   Given titer is stable at 1:8, no concern for new infection. Likely her baseline. If she has been recently exposed, would repeat testing in 3-4 weeks.   10/05/2024: RPR (+), titer 1:8, Trep Ab (+) - but known hx infection on file with [facility], unclear why Trep Ab done --- bicillin  #3 mil unit 10/26/2023 in hospital before discharge 10/24/2023: RPR (+), titer 1:8, Trep Ab (+) in hospital @delivery   --- bicillin  #2, 2.4 mil unit 10/19/2023 --- bicillin  #1 (round 2), 2.4 mil unit 10/12/2023 --- bicillin  #1, 2.4 mil unit 08/18/2023 (did not keep scheduled appts 9/05 and 09/01/23) 08/18/2023: RPR (+), titer 1:8, Trep Ab (+)  Nancy Helling, MD 10/17/24  2:07 PM
